# Patient Record
Sex: Female | Born: 1945 | Race: White | Hispanic: No | Smoking: Current every day smoker
Health system: Southern US, Community
[De-identification: ages and names within clinical notes are randomized; demographics above are authoritative.]

## PROBLEM LIST (undated history)

## (undated) DIAGNOSIS — C8590 Non-Hodgkin lymphoma, unspecified, unspecified site: Secondary | ICD-10-CM

## (undated) DIAGNOSIS — I10 Essential (primary) hypertension: Secondary | ICD-10-CM

## (undated) DIAGNOSIS — I739 Peripheral vascular disease, unspecified: Secondary | ICD-10-CM

## (undated) DIAGNOSIS — Z87898 Personal history of other specified conditions: Secondary | ICD-10-CM

## (undated) DIAGNOSIS — Z8619 Personal history of other infectious and parasitic diseases: Secondary | ICD-10-CM

## (undated) DIAGNOSIS — I5020 Unspecified systolic (congestive) heart failure: Secondary | ICD-10-CM

## (undated) DIAGNOSIS — I745 Embolism and thrombosis of iliac artery: Secondary | ICD-10-CM

## (undated) DIAGNOSIS — C859A Non-Hodgkin lymphoma, unspecified, in remission: Secondary | ICD-10-CM

## (undated) DIAGNOSIS — T8859XA Other complications of anesthesia, initial encounter: Secondary | ICD-10-CM

## (undated) DIAGNOSIS — I429 Cardiomyopathy, unspecified: Secondary | ICD-10-CM

## (undated) DIAGNOSIS — C801 Malignant (primary) neoplasm, unspecified: Secondary | ICD-10-CM

## (undated) HISTORY — DX: Malignant (primary) neoplasm, unspecified: C80.1

## (undated) HISTORY — DX: Personal history of other specified conditions: Z87.898

## (undated) HISTORY — PX: GALLBLADDER SURGERY: SHX652

## (undated) HISTORY — DX: Personal history of other infectious and parasitic diseases: Z86.19

---

## 2004-08-11 ENCOUNTER — Ambulatory Visit: Payer: Self-pay | Admitting: Unknown Physician Specialty

## 2004-11-16 ENCOUNTER — Ambulatory Visit: Payer: Self-pay | Admitting: Internal Medicine

## 2005-08-24 ENCOUNTER — Ambulatory Visit: Payer: Self-pay | Admitting: Unknown Physician Specialty

## 2005-11-18 ENCOUNTER — Emergency Department: Payer: Self-pay | Admitting: Emergency Medicine

## 2006-04-30 ENCOUNTER — Ambulatory Visit: Payer: Self-pay | Admitting: Urology

## 2006-10-30 ENCOUNTER — Ambulatory Visit: Payer: Self-pay | Admitting: Urology

## 2007-02-27 ENCOUNTER — Ambulatory Visit: Payer: Self-pay | Admitting: Vascular Surgery

## 2007-03-06 ENCOUNTER — Inpatient Hospital Stay: Payer: Self-pay | Admitting: Vascular Surgery

## 2007-03-16 ENCOUNTER — Emergency Department: Payer: Self-pay | Admitting: Emergency Medicine

## 2007-05-14 ENCOUNTER — Ambulatory Visit: Payer: Self-pay | Admitting: Internal Medicine

## 2007-07-23 ENCOUNTER — Ambulatory Visit: Payer: Self-pay | Admitting: Urology

## 2007-10-16 ENCOUNTER — Ambulatory Visit: Payer: Self-pay | Admitting: Internal Medicine

## 2007-10-29 ENCOUNTER — Ambulatory Visit: Payer: Self-pay | Admitting: Unknown Physician Specialty

## 2007-11-14 ENCOUNTER — Ambulatory Visit: Payer: Self-pay | Admitting: Internal Medicine

## 2007-11-16 ENCOUNTER — Ambulatory Visit: Payer: Self-pay | Admitting: Internal Medicine

## 2007-11-18 ENCOUNTER — Ambulatory Visit: Payer: Self-pay | Admitting: Internal Medicine

## 2007-11-25 ENCOUNTER — Ambulatory Visit: Payer: Self-pay | Admitting: Internal Medicine

## 2007-12-03 ENCOUNTER — Ambulatory Visit: Payer: Self-pay | Admitting: Surgery

## 2007-12-17 ENCOUNTER — Ambulatory Visit: Payer: Self-pay | Admitting: Internal Medicine

## 2007-12-26 ENCOUNTER — Ambulatory Visit: Payer: Self-pay | Admitting: Internal Medicine

## 2008-01-16 ENCOUNTER — Ambulatory Visit: Payer: Self-pay | Admitting: Internal Medicine

## 2008-02-16 ENCOUNTER — Ambulatory Visit: Payer: Self-pay | Admitting: Internal Medicine

## 2008-03-17 ENCOUNTER — Ambulatory Visit: Payer: Self-pay | Admitting: Internal Medicine

## 2008-04-17 ENCOUNTER — Ambulatory Visit: Payer: Self-pay | Admitting: Internal Medicine

## 2008-04-17 HISTORY — PX: TONSILLECTOMY: SUR1361

## 2008-04-20 ENCOUNTER — Ambulatory Visit: Payer: Self-pay | Admitting: Internal Medicine

## 2008-05-18 ENCOUNTER — Ambulatory Visit: Payer: Self-pay | Admitting: Internal Medicine

## 2008-06-15 ENCOUNTER — Ambulatory Visit: Payer: Self-pay | Admitting: Oncology

## 2008-07-16 ENCOUNTER — Ambulatory Visit: Payer: Self-pay | Admitting: Oncology

## 2008-07-21 ENCOUNTER — Ambulatory Visit: Payer: Self-pay | Admitting: Oncology

## 2008-07-28 ENCOUNTER — Ambulatory Visit: Payer: Self-pay | Admitting: Urology

## 2008-08-15 ENCOUNTER — Ambulatory Visit: Payer: Self-pay | Admitting: Oncology

## 2008-09-15 ENCOUNTER — Ambulatory Visit: Payer: Self-pay | Admitting: Oncology

## 2008-10-15 ENCOUNTER — Ambulatory Visit: Payer: Self-pay | Admitting: Oncology

## 2008-10-21 ENCOUNTER — Ambulatory Visit: Payer: Self-pay | Admitting: Unknown Physician Specialty

## 2008-11-15 ENCOUNTER — Ambulatory Visit: Payer: Self-pay | Admitting: Oncology

## 2008-11-20 ENCOUNTER — Ambulatory Visit: Payer: Self-pay | Admitting: Oncology

## 2008-11-24 ENCOUNTER — Ambulatory Visit: Payer: Self-pay | Admitting: Oncology

## 2008-12-16 ENCOUNTER — Ambulatory Visit: Payer: Self-pay | Admitting: Oncology

## 2009-01-15 ENCOUNTER — Ambulatory Visit: Payer: Self-pay | Admitting: Oncology

## 2009-02-15 ENCOUNTER — Ambulatory Visit: Payer: Self-pay | Admitting: Oncology

## 2009-02-16 ENCOUNTER — Ambulatory Visit: Payer: Self-pay | Admitting: Oncology

## 2009-03-17 ENCOUNTER — Ambulatory Visit: Payer: Self-pay | Admitting: Oncology

## 2009-05-04 ENCOUNTER — Ambulatory Visit: Payer: Self-pay | Admitting: Oncology

## 2009-05-18 ENCOUNTER — Ambulatory Visit: Payer: Self-pay | Admitting: Oncology

## 2009-06-15 ENCOUNTER — Ambulatory Visit: Payer: Self-pay | Admitting: Oncology

## 2009-06-29 ENCOUNTER — Ambulatory Visit: Payer: Self-pay | Admitting: Oncology

## 2009-07-16 ENCOUNTER — Ambulatory Visit: Payer: Self-pay | Admitting: Oncology

## 2009-09-07 ENCOUNTER — Ambulatory Visit: Payer: Self-pay | Admitting: Oncology

## 2009-09-15 ENCOUNTER — Ambulatory Visit: Payer: Self-pay | Admitting: Oncology

## 2009-09-16 ENCOUNTER — Ambulatory Visit: Payer: Self-pay | Admitting: Surgery

## 2009-09-23 ENCOUNTER — Ambulatory Visit: Payer: Self-pay | Admitting: Surgery

## 2009-10-15 ENCOUNTER — Ambulatory Visit: Payer: Self-pay | Admitting: Oncology

## 2009-10-26 ENCOUNTER — Ambulatory Visit: Payer: Self-pay | Admitting: Oncology

## 2009-11-15 ENCOUNTER — Ambulatory Visit: Payer: Self-pay | Admitting: Oncology

## 2010-04-06 ENCOUNTER — Ambulatory Visit: Payer: Self-pay | Admitting: Oncology

## 2010-04-17 ENCOUNTER — Ambulatory Visit: Payer: Self-pay | Admitting: Oncology

## 2010-04-19 ENCOUNTER — Ambulatory Visit: Payer: Self-pay | Admitting: Oncology

## 2010-05-18 ENCOUNTER — Ambulatory Visit: Payer: Self-pay | Admitting: Oncology

## 2010-07-04 ENCOUNTER — Ambulatory Visit: Payer: Self-pay | Admitting: Urology

## 2010-11-09 ENCOUNTER — Ambulatory Visit: Payer: Self-pay | Admitting: Oncology

## 2010-11-16 ENCOUNTER — Ambulatory Visit: Payer: Self-pay | Admitting: Oncology

## 2011-04-18 HISTORY — PX: ABDOMINAL HYSTERECTOMY: SHX81

## 2011-05-19 ENCOUNTER — Ambulatory Visit: Payer: Self-pay | Admitting: Oncology

## 2011-05-19 LAB — COMPREHENSIVE METABOLIC PANEL
Albumin: 4.1 g/dL (ref 3.4–5.0)
Alkaline Phosphatase: 214 U/L — ABNORMAL HIGH (ref 50–136)
BUN: 12 mg/dL (ref 7–18)
Bilirubin,Total: 0.3 mg/dL (ref 0.2–1.0)
Chloride: 104 mmol/L (ref 98–107)
Creatinine: 1.13 mg/dL (ref 0.60–1.30)
EGFR (African American): >60
Osmolality: 279 (ref 275–301)
SGOT(AST): 20 U/L (ref 15–37)
SGPT (ALT): 26 U/L
Total Protein: 8 g/dL (ref 6.4–8.2)

## 2011-05-19 LAB — CBC CANCER CENTER
Basophil #: 0.1 x10 3/mm (ref 0.0–0.1)
Eosinophil #: 0.1 x10 3/mm (ref 0.0–0.7)
Eosinophil %: 0.8 %
Lymphocyte #: 1.8 x10 3/mm (ref 1.0–3.6)
Lymphocyte %: 24 %
MCH: 31.1 pg (ref 26.0–34.0)
MCV: 92.8 fL (ref 80–100)
Monocyte #: 0.5 x10 3/mm (ref 0.0–0.7)
Monocyte %: 6.7 %
Neutrophil %: 67.7 %
Platelet: 191 x10 3/mm (ref 150–440)
RBC: 4.36 10*6/uL (ref 3.80–5.20)
RDW: 13.1 % (ref 11.5–14.5)

## 2011-05-19 LAB — SEDIMENTATION RATE: Erythrocyte Sed Rate: 33 mm/hr — ABNORMAL HIGH (ref 0–30)

## 2011-06-16 ENCOUNTER — Ambulatory Visit: Payer: Self-pay | Admitting: Oncology

## 2011-11-17 ENCOUNTER — Ambulatory Visit: Payer: Self-pay | Admitting: Oncology

## 2011-11-17 LAB — CBC CANCER CENTER
Basophil #: 0.1 x10 3/mm (ref 0.0–0.1)
HGB: 13.4 g/dL (ref 12.0–16.0)
Lymphocyte #: 2.2 x10 3/mm (ref 1.0–3.6)
Lymphocyte %: 28.4 %
MCH: 30.9 pg (ref 26.0–34.0)
MCV: 92 fL (ref 80–100)
Monocyte #: 0.7 x10 3/mm (ref 0.2–0.9)
Platelet: 187 x10 3/mm (ref 150–440)
RDW: 14.4 % (ref 11.5–14.5)

## 2011-11-17 LAB — COMPREHENSIVE METABOLIC PANEL
Albumin: 3.8 g/dL (ref 3.4–5.0)
Anion Gap: 10 (ref 7–16)
Calcium, Total: 9.4 mg/dL (ref 8.5–10.1)
Creatinine: 1.05 mg/dL (ref 0.60–1.30)
EGFR (Non-African Amer.): 56 — ABNORMAL LOW
Glucose: 94 mg/dL (ref 65–99)
Osmolality: 273 (ref 275–301)
SGOT(AST): 24 U/L (ref 15–37)
SGPT (ALT): 34 U/L
Sodium: 136 mmol/L (ref 136–145)
Total Protein: 7.6 g/dL (ref 6.4–8.2)

## 2011-11-17 LAB — LACTATE DEHYDROGENASE: LDH: 186 U/L (ref 81–234)

## 2011-11-27 ENCOUNTER — Ambulatory Visit: Payer: Self-pay | Admitting: Oncology

## 2011-12-17 ENCOUNTER — Ambulatory Visit: Payer: Self-pay | Admitting: Oncology

## 2012-12-05 ENCOUNTER — Ambulatory Visit: Payer: Self-pay | Admitting: Oncology

## 2012-12-06 LAB — COMPREHENSIVE METABOLIC PANEL
Albumin: 3.9 g/dL (ref 3.4–5.0)
Anion Gap: 8 (ref 7–16)
BUN: 20 mg/dL — ABNORMAL HIGH (ref 7–18)
Chloride: 103 mmol/L (ref 98–107)
Co2: 28 mmol/L (ref 21–32)
EGFR (African American): 48 — ABNORMAL LOW
EGFR (Non-African Amer.): 42 — ABNORMAL LOW
Glucose: 93 mg/dL (ref 65–99)
Osmolality: 280 (ref 275–301)
SGPT (ALT): 26 U/L (ref 12–78)

## 2012-12-06 LAB — CBC CANCER CENTER
Eosinophil #: 0.1 x10 3/mm (ref 0.0–0.7)
Eosinophil %: 1.3 %
HGB: 12.9 g/dL (ref 12.0–16.0)
Lymphocyte #: 2.4 x10 3/mm (ref 1.0–3.6)
Lymphocyte %: 32.8 %
MCHC: 33.5 g/dL (ref 32.0–36.0)
Monocyte %: 8.4 %
Neutrophil #: 4.1 x10 3/mm (ref 1.4–6.5)
Platelet: 204 x10 3/mm (ref 150–440)
RBC: 4.27 10*6/uL (ref 3.80–5.20)
RDW: 14.3 % (ref 11.5–14.5)

## 2012-12-06 LAB — SEDIMENTATION RATE: Erythrocyte Sed Rate: 42 mm/hr — ABNORMAL HIGH (ref 0–30)

## 2012-12-16 ENCOUNTER — Ambulatory Visit: Payer: Self-pay | Admitting: Oncology

## 2013-02-18 ENCOUNTER — Emergency Department: Payer: Self-pay | Admitting: Emergency Medicine

## 2013-05-27 ENCOUNTER — Ambulatory Visit: Payer: Self-pay | Admitting: Internal Medicine

## 2013-12-19 ENCOUNTER — Ambulatory Visit: Payer: Self-pay | Admitting: Oncology

## 2013-12-19 LAB — COMPREHENSIVE METABOLIC PANEL
ALK PHOS: 166 U/L — AB
Albumin: 3.6 g/dL (ref 3.4–5.0)
Anion Gap: 8 (ref 7–16)
BUN: 15 mg/dL (ref 7–18)
Bilirubin,Total: 0.3 mg/dL (ref 0.2–1.0)
CHLORIDE: 103 mmol/L (ref 98–107)
Calcium, Total: 9.2 mg/dL (ref 8.5–10.1)
Co2: 28 mmol/L (ref 21–32)
Creatinine: 1.15 mg/dL (ref 0.60–1.30)
EGFR (African American): 57 — ABNORMAL LOW
EGFR (Non-African Amer.): 49 — ABNORMAL LOW
GLUCOSE: 88 mg/dL (ref 65–99)
Osmolality: 278 (ref 275–301)
POTASSIUM: 3.9 mmol/L (ref 3.5–5.1)
SGOT(AST): 18 U/L (ref 15–37)
SGPT (ALT): 27 U/L
Sodium: 139 mmol/L (ref 136–145)
Total Protein: 7.5 g/dL (ref 6.4–8.2)

## 2013-12-19 LAB — CBC CANCER CENTER
BASOS PCT: 0.8 %
Basophil #: 0.1 x10 3/mm (ref 0.0–0.1)
EOS ABS: 0.1 x10 3/mm (ref 0.0–0.7)
Eosinophil %: 1.5 %
HCT: 37.4 % (ref 35.0–47.0)
HGB: 12.6 g/dL (ref 12.0–16.0)
Lymphocyte #: 2.3 x10 3/mm (ref 1.0–3.6)
Lymphocyte %: 27.2 %
MCH: 30.9 pg (ref 26.0–34.0)
MCHC: 33.7 g/dL (ref 32.0–36.0)
MCV: 92 fL (ref 80–100)
MONO ABS: 0.6 x10 3/mm (ref 0.2–0.9)
Monocyte %: 7.8 %
NEUTROS ABS: 5.2 x10 3/mm (ref 1.4–6.5)
NEUTROS PCT: 62.7 %
PLATELETS: 213 x10 3/mm (ref 150–440)
RBC: 4.08 10*6/uL (ref 3.80–5.20)
RDW: 14.3 % (ref 11.5–14.5)
WBC: 8.3 x10 3/mm (ref 3.6–11.0)

## 2013-12-19 LAB — LACTATE DEHYDROGENASE: LDH: 187 U/L (ref 81–246)

## 2014-01-15 ENCOUNTER — Ambulatory Visit: Payer: Self-pay | Admitting: Oncology

## 2014-08-17 DIAGNOSIS — Z8579 Personal history of other malignant neoplasms of lymphoid, hematopoietic and related tissues: Secondary | ICD-10-CM | POA: Insufficient documentation

## 2014-11-18 ENCOUNTER — Ambulatory Visit (INDEPENDENT_AMBULATORY_CARE_PROVIDER_SITE_OTHER): Payer: Medicare PPO | Admitting: Family Medicine

## 2014-11-18 ENCOUNTER — Encounter: Payer: Self-pay | Admitting: Family Medicine

## 2014-11-18 VITALS — BP 190/90 | HR 77 | Resp 16 | Ht 62.0 in | Wt 169.0 lb

## 2014-11-18 DIAGNOSIS — R12 Heartburn: Secondary | ICD-10-CM | POA: Diagnosis not present

## 2014-11-18 DIAGNOSIS — F17200 Nicotine dependence, unspecified, uncomplicated: Secondary | ICD-10-CM | POA: Insufficient documentation

## 2014-11-18 DIAGNOSIS — R350 Frequency of micturition: Secondary | ICD-10-CM | POA: Diagnosis not present

## 2014-11-18 DIAGNOSIS — I10 Essential (primary) hypertension: Secondary | ICD-10-CM | POA: Insufficient documentation

## 2014-11-18 DIAGNOSIS — J302 Other seasonal allergic rhinitis: Secondary | ICD-10-CM | POA: Diagnosis not present

## 2014-11-18 DIAGNOSIS — G5792 Unspecified mononeuropathy of left lower limb: Secondary | ICD-10-CM

## 2014-11-18 DIAGNOSIS — M792 Neuralgia and neuritis, unspecified: Secondary | ICD-10-CM

## 2014-11-18 DIAGNOSIS — Z720 Tobacco use: Secondary | ICD-10-CM | POA: Diagnosis not present

## 2014-11-18 DIAGNOSIS — K219 Gastro-esophageal reflux disease without esophagitis: Secondary | ICD-10-CM | POA: Diagnosis not present

## 2014-11-18 MED ORDER — LOSARTAN POTASSIUM 50 MG PO TABS: 50.0000 mg | ORAL_TABLET | Freq: Every day | ORAL | Status: DC

## 2014-11-18 MED ORDER — GABAPENTIN 100 MG PO CAPS: ORAL_CAPSULE | ORAL | Status: DC

## 2014-11-18 MED ORDER — OMEPRAZOLE 20 MG PO CPDR: 20.0000 mg | DELAYED_RELEASE_CAPSULE | Freq: Every day | ORAL | Status: DC

## 2014-11-18 MED ORDER — FLUTICASONE PROPIONATE 50 MCG/ACT NA SUSP: 2.0000 | Freq: Every day | NASAL | Status: DC

## 2014-11-18 NOTE — Progress Notes (Signed)
Name: Ashley Lynch   MRN: [id number]    DOB: 09/07/1945   Date:11/18/2014       Progress Note  Subjective  Chief Complaint  Chief Complaint  Patient presents with  . Establish Care    Follows: Dr. Paulino Door[facility] -Dr. Lucky Cowboy, Dr. Irven Easterly. [facility],Dr.Mcqueen at [facility] and Dr. Humphrey Rolls     HPI  Here to establish care.  Has hx. Of non-Hodgkin's lymphoma, HBP, PVD with cartotid endarectomy, GERD, Neuropathic pain in legs.  Reports 1 episods of swelling around lips 1 day 2 weeks ago.  Rewsolved spontaneous;ly.  Here to change primary proividers.   Past Medical History  Diagnosis Date  . History of measles, mumps, or rubella   . History of chicken pox   . Cancer     lymphoma    Past Surgical History  Procedure Laterality Date  . Gallbladder surgery  [place]  . Abdominal hysterectomy  2013  . Tonsillectomy  2010    Family History  Problem Relation Age of Onset  . Diabetes Mother     sibling and grandparent also    History   Social History  . Marital Status: Widowed    Spouse Name: N/A  . Number of Children: N/A  . Years of Education: N/A   Occupational History  . Not on file.   Social History Main Topics  . Smoking status: Current Every Day Smoker -- 1.00 packs/day for 52 years    Types: Cigarettes  . Smokeless tobacco: Never Used  . Alcohol Use: No  . Drug Use: No  . Sexual Activity: Not on file   Other Topics Concern  . Not on file   Social History Narrative  . No narrative on file     Current outpatient prescriptions:  .  amLODipine (NORVASC) 5 MG tablet, Take 5 mg by mouth daily., Disp: , Rfl: 4 .  aspirin 81 MG tablet, Take 81 mg by mouth daily., Disp: , Rfl:  .  fluticasone (FLONASE) 50 MCG/ACT nasal spray, daily., Disp: , Rfl: 0 .  gabapentin (NEURONTIN) 100 MG capsule, TAKE 2 CAPSULES BY MOUTH AT BEDTIME AS DIRECTED, Disp: , Rfl: 6 .  omeprazole (PRILOSEC) 20 MG capsule, Take 20 mg by mouth daily., Disp: , Rfl: 6  No Known  Allergies   Review of Systems  Constitutional: Positive for malaise/fatigue. Negative for fever, chills and weight loss.  HENT: Positive for tinnitus. Negative for congestion, ear pain and nosebleeds.   Eyes: Negative for blurred vision and double vision.  Respiratory: Positive for wheezing. Negative for cough, sputum production and shortness of breath.   Cardiovascular: Positive for palpitations (occ.). Negative for chest pain, claudication and leg swelling.  Gastrointestinal: Positive for heartburn and vomiting. Negative for nausea, abdominal pain, diarrhea and blood in stool.  Genitourinary: Positive for urgency and frequency. Negative for dysuria and hematuria.  Musculoskeletal: Positive for myalgias and back pain.  Skin: Positive for itching. Negative for rash.  Neurological: Positive for tingling (L. leg) and weakness. Negative for dizziness and headaches.       Leg cramps especially at night.  Psychiatric/Behavioral: Negative for depression. The patient is not nervous/anxious and does not have insomnia.       Objective  Filed Vitals:   11/18/14 1016  BP: 170/84  Pulse: 77  Resp: 16  Height: 5\' 2"  (1.575 m)  Weight: 169 lb (76.658 kg)    Physical Exam  Constitutional: She is oriented to person, place, and time  and well-developed, well-nourished, and in no distress. No distress.  HENT:  Head: Normocephalic and atraumatic.  Eyes: Conjunctivae and EOM are normal. Pupils are equal, round, and reactive to light. No scleral icterus.  Neck: Normal range of motion. Neck supple. No thyromegaly present.  R. zarotid endartectomy scar.  Cardiovascular: Normal rate, regular rhythm, normal heart sounds and intact distal pulses.   Occasional extrasystoles are present. Exam reveals no gallop and no friction rub.   No murmur heard. Pulmonary/Chest: Effort normal and breath sounds normal. No respiratory distress. She has no wheezes. She has no rales. She exhibits no tenderness.   Abdominal: Soft. Bowel sounds are normal. She exhibits no distension and no mass. There is tenderness (mild diffuse tenderness of bilateral lower quads.). There is no rebound and no guarding.  Musculoskeletal: Normal range of motion. She exhibits edema (trace bilateral pedal edema.).  Lymphadenopathy:    She has no cervical adenopathy.  Neurological: She is alert and oriented to person, place, and time.  Skin: Skin is warm and dry.  Vitals reviewed.         Assessment & Plan  Problem List Items Addressed This Visit      Cardiovascular and Mediastinum   Hypertension   Relevant Medications   amLODipine (NORVASC) 5 MG tablet   aspirin 81 MG tablet     Other   Heartburn - Primary   Urinary frequency    Other Visit Diagnoses    Smoking           Meds ordered this encounter  Medications  . amLODipine (NORVASC) 5 MG tablet    Sig: Take 5 mg by mouth daily.    Refill:  4  . DISCONTD: cyclobenzaprine (FLEXERIL) 10 MG tablet    Sig: Take 10 mg by mouth at bedtime as needed.    Refill:  4  . fluticasone (FLONASE) 50 MCG/ACT nasal spray    Sig: daily.    Refill:  0  . gabapentin (NEURONTIN) 100 MG capsule    Sig: TAKE 2 CAPSULES BY MOUTH AT BEDTIME AS DIRECTED    Refill:  6  . omeprazole (PRILOSEC) 20 MG capsule    Sig: Take 20 mg by mouth daily.    Refill:  6  . DISCONTD: sertraline (ZOLOFT) 50 MG tablet    Sig: ONCE A DAY BY MOUTH    Refill:  3  . aspirin 81 MG tablet    Sig: Take 81 mg by mouth daily.    1. Heartburn   2. Essential hypertension  - losartan (COZAAR) 50 MG tablet; Take 1 tablet (50 mg total) by mouth daily.  Dispense: 90 tablet; Refill: 3  3. Urinary frequency   4. Smoking   5. Neuropathic pain, leg, left  - gabapentin (NEURONTIN) 100 MG capsule; Take 2 caps each night  Dispense: 60 capsule; Refill: 6  6. Gastroesophageal reflux disease without esophagitis  - omeprazole (PRILOSEC) 20 MG capsule; Take 1 capsule (20 mg total) by mouth  daily.  Dispense: 90 capsule; Refill: 3  7. Seasonal allergies  - fluticasone (FLONASE) 50 MCG/ACT nasal spray; Place 2 sprays into both nostrils daily. Dispense 3 bottles.  Dispense: 16 g; Refill: 3

## 2014-11-18 NOTE — Addendum Note (Signed)
Addended by: Theresia Majors A on: 11/18/2014 11:26 AM   Modules accepted: Orders

## 2014-11-24 LAB — CBC
HEMATOCRIT: 36.6 % (ref 34.0–46.6)
HEMOGLOBIN: 12.4 g/dL (ref 11.1–15.9)
MCH: 31 pg (ref 26.6–33.0)
MCHC: 33.9 g/dL (ref 31.5–35.7)
MCV: 92 fL (ref 79–97)
Platelets: 257 10*3/uL (ref 150–379)
RBC: 4 x10E6/uL (ref 3.77–5.28)
RDW: 14.1 % (ref 12.3–15.4)
WBC: 8.3 10*3/uL (ref 3.4–10.8)

## 2014-11-25 LAB — COMPREHENSIVE METABOLIC PANEL
ALT: 15 IU/L (ref 0–32)
AST: 16 IU/L (ref 0–40)
Albumin/Globulin Ratio: 1.6 (ref 1.1–2.5)
Albumin: 4.2 g/dL (ref 3.6–4.8)
Alkaline Phosphatase: 150 IU/L — ABNORMAL HIGH (ref 39–117)
BUN/Creatinine Ratio: 17 (ref 11–26)
BUN: 18 mg/dL (ref 8–27)
Bilirubin Total: 0.2 mg/dL (ref 0.0–1.2)
CALCIUM: 9.7 mg/dL (ref 8.7–10.3)
CO2: 22 mmol/L (ref 18–29)
CREATININE: 1.03 mg/dL — AB (ref 0.57–1.00)
Chloride: 101 mmol/L (ref 97–108)
GFR calc Af Amer: 65 mL/min/{1.73_m2} (ref >59)
GFR, EST NON AFRICAN AMERICAN: 56 mL/min/{1.73_m2} — AB (ref >59)
GLOBULIN, TOTAL: 2.6 g/dL (ref 1.5–4.5)
Glucose: 91 mg/dL (ref 65–99)
Potassium: 4.5 mmol/L (ref 3.5–5.2)
SODIUM: 142 mmol/L (ref 134–144)
Total Protein: 6.8 g/dL (ref 6.0–8.5)

## 2014-11-25 NOTE — Progress Notes (Signed)
mailed

## 2014-12-11 ENCOUNTER — Other Ambulatory Visit: Payer: Self-pay | Admitting: *Deleted

## 2014-12-11 DIAGNOSIS — Z8579 Personal history of other malignant neoplasms of lymphoid, hematopoietic and related tissues: Secondary | ICD-10-CM

## 2014-12-17 ENCOUNTER — Inpatient Hospital Stay: Payer: Medicare PPO

## 2014-12-17 ENCOUNTER — Inpatient Hospital Stay: Payer: Medicare PPO | Admitting: Oncology

## 2014-12-23 ENCOUNTER — Encounter: Payer: Self-pay | Admitting: Family Medicine

## 2014-12-23 ENCOUNTER — Ambulatory Visit (INDEPENDENT_AMBULATORY_CARE_PROVIDER_SITE_OTHER): Payer: Medicare PPO | Admitting: Family Medicine

## 2014-12-23 ENCOUNTER — Inpatient Hospital Stay (HOSPITAL_BASED_OUTPATIENT_CLINIC_OR_DEPARTMENT_OTHER): Payer: Medicare PPO | Admitting: Oncology

## 2014-12-23 ENCOUNTER — Inpatient Hospital Stay: Payer: Medicare PPO | Attending: Oncology

## 2014-12-23 ENCOUNTER — Encounter: Payer: Self-pay | Admitting: Oncology

## 2014-12-23 VITALS — BP 130/79 | HR 80 | Temp 98.9°F | Resp 16 | Ht 62.0 in | Wt 168.0 lb

## 2014-12-23 VITALS — BP 133/83 | HR 74 | Temp 95.6°F | Wt 169.2 lb

## 2014-12-23 DIAGNOSIS — R531 Weakness: Secondary | ICD-10-CM | POA: Diagnosis not present

## 2014-12-23 DIAGNOSIS — Z23 Encounter for immunization: Secondary | ICD-10-CM

## 2014-12-23 DIAGNOSIS — Z8579 Personal history of other malignant neoplasms of lymphoid, hematopoietic and related tissues: Secondary | ICD-10-CM

## 2014-12-23 DIAGNOSIS — F17210 Nicotine dependence, cigarettes, uncomplicated: Secondary | ICD-10-CM | POA: Insufficient documentation

## 2014-12-23 DIAGNOSIS — R509 Fever, unspecified: Secondary | ICD-10-CM | POA: Diagnosis not present

## 2014-12-23 DIAGNOSIS — I10 Essential (primary) hypertension: Secondary | ICD-10-CM

## 2014-12-23 DIAGNOSIS — Z7982 Long term (current) use of aspirin: Secondary | ICD-10-CM | POA: Insufficient documentation

## 2014-12-23 DIAGNOSIS — M549 Dorsalgia, unspecified: Secondary | ICD-10-CM | POA: Insufficient documentation

## 2014-12-23 DIAGNOSIS — Z8572 Personal history of non-Hodgkin lymphomas: Secondary | ICD-10-CM

## 2014-12-23 DIAGNOSIS — R5383 Other fatigue: Secondary | ICD-10-CM | POA: Insufficient documentation

## 2014-12-23 DIAGNOSIS — M898X9 Other specified disorders of bone, unspecified site: Secondary | ICD-10-CM | POA: Insufficient documentation

## 2014-12-23 DIAGNOSIS — Z79899 Other long term (current) drug therapy: Secondary | ICD-10-CM | POA: Diagnosis not present

## 2014-12-23 LAB — CBC WITH DIFFERENTIAL/PLATELET
Basophils Absolute: 0.1 10*3/uL (ref 0–0.1)
Basophils Relative: 1 %
EOS ABS: 0.2 10*3/uL (ref 0–0.7)
Eosinophils Relative: 2 %
HCT: 36.4 % (ref 35.0–47.0)
HEMOGLOBIN: 12.3 g/dL (ref 12.0–16.0)
LYMPHS ABS: 2.7 10*3/uL (ref 1.0–3.6)
LYMPHS PCT: 33 %
MCH: 30.4 pg (ref 26.0–34.0)
MCHC: 33.8 g/dL (ref 32.0–36.0)
MCV: 90.1 fL (ref 80.0–100.0)
MONOS PCT: 8 %
Monocytes Absolute: 0.7 10*3/uL (ref 0.2–0.9)
NEUTROS ABS: 4.5 10*3/uL (ref 1.4–6.5)
NEUTROS PCT: 56 %
Platelets: 205 10*3/uL (ref 150–440)
RBC: 4.04 MIL/uL (ref 3.80–5.20)
RDW: 13.9 % (ref 11.5–14.5)
WBC: 8.1 10*3/uL (ref 3.6–11.0)

## 2014-12-23 LAB — COMPREHENSIVE METABOLIC PANEL
ALT: 16 U/L (ref 14–54)
AST: 19 U/L (ref 15–41)
Albumin: 3.9 g/dL (ref 3.5–5.0)
Alkaline Phosphatase: 124 U/L (ref 38–126)
Anion gap: 6 (ref 5–15)
BILIRUBIN TOTAL: 0.4 mg/dL (ref 0.3–1.2)
BUN: 20 mg/dL (ref 6–20)
CO2: 24 mmol/L (ref 22–32)
CREATININE: 1.16 mg/dL — AB (ref 0.44–1.00)
Calcium: 8.4 mg/dL — ABNORMAL LOW (ref 8.9–10.3)
Chloride: 107 mmol/L (ref 101–111)
GFR calc non Af Amer: 47 mL/min — ABNORMAL LOW (ref >60)
GFR, EST AFRICAN AMERICAN: 54 mL/min — AB (ref >60)
Glucose, Bld: 122 mg/dL — ABNORMAL HIGH (ref 65–99)
Potassium: 3.6 mmol/L (ref 3.5–5.1)
SODIUM: 137 mmol/L (ref 135–145)
TOTAL PROTEIN: 7.1 g/dL (ref 6.5–8.1)

## 2014-12-23 LAB — LACTATE DEHYDROGENASE: LDH: 143 U/L (ref 98–192)

## 2014-12-23 MED ORDER — LOSARTAN POTASSIUM 25 MG PO TABS: ORAL_TABLET | ORAL | Status: DC

## 2014-12-23 NOTE — Progress Notes (Signed)
Name: Ashley Lynch   MRN: [id number]    DOB: 08/26/1945   Date:12/23/2014       Progress Note  Subjective  Chief Complaint  Chief Complaint  Patient presents with  . Hypertension    Pt d/c Losartan    HPI  Here to f/u HBP.  Had back and abd pain after taking Losartan for 2 weeks.  Stopped it and says feels better.  But Dr. Jeb Levering feels that Liver is enlagred and she is going to have a PET scan.  BPs At home running 130s-150s/75-90 .  Was better on Losartan. No problem-specific assessment & plan notes found for this encounter.   Past Medical History  Diagnosis Date  . History of measles, mumps, or rubella   . History of chicken pox   . Cancer     lymphoma    Social History  Substance Use Topics  . Smoking status: Current Every Day Smoker -- 1.00 packs/day for 52 years    Types: Cigarettes  . Smokeless tobacco: Never Used  . Alcohol Use: No     Current outpatient prescriptions:  .  amLODipine (NORVASC) 5 MG tablet, Take 5 mg by mouth daily., Disp: , Rfl: 4 .  aspirin 81 MG tablet, Take 81 mg by mouth daily., Disp: , Rfl:  .  diphenhydrAMINE (BENADRYL) 25 mg capsule, Take 25 mg by mouth every 6 (six) hours as needed., Disp: , Rfl:  .  fluticasone (FLONASE) 50 MCG/ACT nasal spray, Place 2 sprays into both nostrils daily. Dispense 3 bottles., Disp: 16 g, Rfl: 3 .  gabapentin (NEURONTIN) 100 MG capsule, Take 2 caps each night, Disp: 60 capsule, Rfl: 6 .  meclizine (ANTIVERT) 25 MG tablet, Take 25 mg by mouth daily as needed for dizziness., Disp: , Rfl:  .  omeprazole (PRILOSEC) 20 MG capsule, Take 1 capsule (20 mg total) by mouth daily., Disp: 90 capsule, Rfl: 3  No Known Allergies  Review of Systems  Constitutional: Positive for malaise/fatigue. Negative for fever, chills and weight loss.  HENT: Negative for hearing loss.   Eyes: Negative for blurred vision and double vision.  Respiratory: Negative for cough, sputum production, shortness of breath and wheezing.    Cardiovascular: Negative for chest pain, palpitations, orthopnea and leg swelling.  Gastrointestinal: Negative for heartburn, nausea, vomiting, abdominal pain, diarrhea and blood in stool.  Genitourinary: Negative for dysuria, urgency and frequency.  Musculoskeletal: Negative for myalgias and back pain.  Skin: Negative for rash.  Neurological: Negative for dizziness, sensory change, focal weakness, weakness and headaches.      Objective  Filed Vitals:   12/23/14 1339  BP: 130/79  Pulse: 80  Temp: 98.9 F (37.2 C)  TempSrc: Oral  Resp: 16  Height: '5\' 2"'  (1.575 m)  Weight: 168 lb (76.204 kg)     Physical Exam  Constitutional: She is well-developed, well-nourished, and in no distress. No distress.  HENT:  Head: Normocephalic and atraumatic.  Eyes: Conjunctivae and EOM are normal. Pupils are equal, round, and reactive to light. No scleral icterus.  Neck: Normal range of motion. Neck supple. Carotid bruit is not present. No thyromegaly present.  Cardiovascular: Normal rate, regular rhythm, normal heart sounds and intact distal pulses.  Exam reveals no gallop and no friction rub.   No murmur heard. Pulmonary/Chest: Effort normal and breath sounds normal. No respiratory distress. She has no wheezes. She exhibits no tenderness.  Abdominal: Soft. Bowel sounds are normal. She exhibits no distension, no abdominal bruit and no  mass. There is no tenderness.  Musculoskeletal: Normal range of motion. She exhibits no edema.  Lymphadenopathy:    She has no cervical adenopathy.  Vitals reviewed.     Recent Results (from the past 2160 hour(s))  CBC     Status: None   Collection Time: 11/24/14  9:45 AM  Result Value Ref Range   WBC 8.3 3.4 - 10.8 x10E3/uL   RBC 4.00 3.77 - 5.28 x10E6/uL   Hemoglobin 12.4 11.1 - 15.9 g/dL   Hematocrit 36.6 34.0 - 46.6 %   MCV 92 79 - 97 fL   MCH 31.0 26.6 - 33.0 pg   MCHC 33.9 31.5 - 35.7 g/dL   RDW 14.1 12.3 - 15.4 %   Platelets 257 150 - 379  x10E3/uL  Comprehensive Metabolic Panel (CMET)     Status: Abnormal   Collection Time: 11/24/14  9:45 AM  Result Value Ref Range   Glucose 91 65 - 99 mg/dL   BUN 18 8 - 27 mg/dL   Creatinine, Ser 1.03 (H) 0.57 - 1.00 mg/dL   GFR calc non Af Amer 56 (L) >59 mL/min/1.73   GFR calc Af Amer 65 >59 mL/min/1.73   BUN/Creatinine Ratio 17 11 - 26   Sodium 142 134 - 144 mmol/L   Potassium 4.5 3.5 - 5.2 mmol/L   Chloride 101 97 - 108 mmol/L   CO2 22 18 - 29 mmol/L   Calcium 9.7 8.7 - 10.3 mg/dL   Total Protein 6.8 6.0 - 8.5 g/dL   Albumin 4.2 3.6 - 4.8 g/dL   Globulin, Total 2.6 1.5 - 4.5 g/dL   Albumin/Globulin Ratio 1.6 1.1 - 2.5   Bilirubin Total 0.2 0.0 - 1.2 mg/dL   Alkaline Phosphatase 150 (H) 39 - 117 IU/L   AST 16 0 - 40 IU/L   ALT 15 0 - 32 IU/L  CBC with Differential     Status: None   Collection Time: 12/23/14 11:01 AM  Result Value Ref Range   WBC 8.1 3.6 - 11.0 K/uL   RBC 4.04 3.80 - 5.20 MIL/uL   Hemoglobin 12.3 12.0 - 16.0 g/dL   HCT 36.4 35.0 - 47.0 %   MCV 90.1 80.0 - 100.0 fL   MCH 30.4 26.0 - 34.0 pg   MCHC 33.8 32.0 - 36.0 g/dL   RDW 13.9 11.5 - 14.5 %   Platelets 205 150 - 440 K/uL   Neutrophils Relative % 56 %   Neutro Abs 4.5 1.4 - 6.5 K/uL   Lymphocytes Relative 33 %   Lymphs Abs 2.7 1.0 - 3.6 K/uL   Monocytes Relative 8 %   Monocytes Absolute 0.7 0.2 - 0.9 K/uL   Eosinophils Relative 2 %   Eosinophils Absolute 0.2 0 - 0.7 K/uL   Basophils Relative 1 %   Basophils Absolute 0.1 0 - 0.1 K/uL  Comprehensive metabolic panel     Status: Abnormal   Collection Time: 12/23/14 11:01 AM  Result Value Ref Range   Sodium 137 135 - 145 mmol/L   Potassium 3.6 3.5 - 5.1 mmol/L   Chloride 107 101 - 111 mmol/L   CO2 24 22 - 32 mmol/L   Glucose, Bld 122 (H) 65 - 99 mg/dL   BUN 20 6 - 20 mg/dL   Creatinine, Ser 1.16 (H) 0.44 - 1.00 mg/dL   Calcium 8.4 (L) 8.9 - 10.3 mg/dL   Total Protein 7.1 6.5 - 8.1 g/dL   Albumin 3.9 3.5 - 5.0 g/dL  AST 19 15 - 41 U/L   ALT  16 14 - 54 U/L   Alkaline Phosphatase 124 38 - 126 U/L   Total Bilirubin 0.4 0.3 - 1.2 mg/dL   GFR calc non Af Amer 47 (L) >60 mL/min   GFR calc Af Amer 54 (L) >60 mL/min    Comment: (NOTE) The eGFR has been calculated using the CKD EPI equation. This calculation has not been validated in all clinical situations. eGFR's persistently <60 mL/min signify possible Chronic Kidney Disease.    Anion gap 6 5 - 15  Lactate dehydrogenase     Status: None   Collection Time: 12/23/14 11:01 AM  Result Value Ref Range   LDH 143 98 - 192 U/L     Assessment & Plan  1. Essential hypertension  - losartan (COZAAR) 25 MG tablet; Take 1/2 tablet daily  Dispense: 30 tablet; Refill: 6  2. Need for influenza vaccination  - Flu vaccine HIGH DOSE PF (Fluzone High dose)

## 2014-12-23 NOTE — Progress Notes (Signed)
Patient does not have living will.  Current every day smoker.  Patient has new PCP - Dr. Larene Beach.

## 2014-12-23 NOTE — Progress Notes (Signed)
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 (immunohistochemistry showed positive for CD20, BCL 6, weak positive for CD10. Negative for BCL-2, CD5 and CD3). bone marrow study negative for lymphom  No flowsheet data found.  INTERVAL HISTORY:   69 year old lady with a history of diffuse B large cell lymphoma in today for the follow-up overall continues to feel somewhat weak [place] has low-grade fever.  No nausea.  No vomiting.  Appetite has been fairly good.  No chills. Back pain and some diffuse bony pain  REVIEW OF SYSTEMS:    general status: Patient is feeling weak and tired.  No change in a performance status.  No chills.  No fever. HEENT:   No evidence of stomatitis Lungs: No cough or shortness of breath Cardiac: No chest pain or paroxysmal nocturnal dyspnea GI: No nausea no vomiting no diarrhea no abdominal pain Skin: No rash Lower extremity no swelling Neurological system: No tingling.  No numbness.  No other focal signs Musculoskeletal system no bony pains  As per HPI. Otherwise, a complete review of systems is negatve.  PAST MEDICAL HISTORY: Past Medical History  Diagnosis Date  . History of measles, mumps, or rubella   . History of chicken pox   . Cancer     lymphoma    PAST SURGICAL HISTORY: Past Surgical History  Procedure Laterality Date  . Gallbladder surgery  [place]  . Abdominal hysterectomy  2013  . Tonsillectomy  2010    FAMILY HISTORY Family History  Problem Relation Age of Onset  . Diabetes Mother     sibling and grandparent also    ADVANCED DIRECTIVES:  No flowsheet data found.  HEALTH MAINTENANCE: Social History  Substance Use  Topics  . Smoking status: Current Every Day Smoker -- 1.00 packs/day for 52 years    Types: Cigarettes  . Smokeless tobacco: Never Used  . Alcohol Use: No      No Known Allergies  Current Outpatient Prescriptions  Medication Sig Dispense Refill  . amLODipine (NORVASC) 5 MG tablet Take 5 mg by mouth daily.  4  . aspirin 81 MG tablet Take 81 mg by mouth daily.    . diphenhydrAMINE (BENADRYL) 25 mg capsule Take 25 mg by mouth every 6 (six) hours as needed.    . fluticasone (FLONASE) 50 MCG/ACT nasal spray Place 2 sprays into both nostrils daily. Dispense 3 bottles. 16 g 3  . gabapentin (NEURONTIN) 100 MG capsule Take 2 caps each night 60 capsule 6  . losartan (COZAAR) 50 MG tablet Take 1 tablet (50 mg total) by mouth daily. (Patient not taking: Reported on 12/23/2014) 90 tablet 3  . omeprazole (PRILOSEC) 20 MG capsule Take 1 capsule (20 mg total) by mouth daily. (Patient not taking: Reported on 12/23/2014) 90 capsule 3   No current facility-administered medications for this visit.    OBJECTIVE:  Filed Vitals:   12/23/14 1225  BP: 133/83  Pulse: 74  Temp: 95.6 F (35.3 C)     Body mass index is 30.95 kg/(m^2).    ECOG FS:1 - Symptomatic but completely ambulatory  PHYSICAL EXAM: Gen. status: Patient is alert and oriented .  Not in any acute distress  Head exam was generally normal. There was no scleral icterus or corneal arcus. Mucous membranes were moist. Lymphatic system: Supraclavicular, cervical, axillary, inguinal lymph nodes are not palpable Lungs: Air entry on both sides no palpitation, no rales Abdomen: Soft liver and spleen are palpable node ascites Lower extremity no edema Neurologically, the patient was awake, alert, and oriented to person, place and time. There were no obvious focal neurologic abnormalities. Examination of the skin revealed no evidence of significant rashes, suspicious appearing nevi or other concerning lesions.   LAB RESULTS:  CBC Latest Ref Rng  12/23/2014 11/24/2014  WBC 3.6 - 11.0 K/uL 8.1 8.3  Hemoglobin 12.0 - 16.0 g/dL 12.3 -  Hematocrit 35.0 - 47.0 % 36.4 36.6  Platelets 150 - 440 K/uL 205 -    Appointment on 12/23/2014  Component Date Value Ref Range Status  . WBC 12/23/2014 8.1  3.6 - 11.0 K/uL Final  . RBC 12/23/2014 4.04  3.80 - 5.20 MIL/uL Final  . Hemoglobin 12/23/2014 12.3  12.0 - 16.0 g/dL Final  . HCT 12/23/2014 36.4  35.0 - 47.0 % Final  . MCV 12/23/2014 90.1  80.0 - 100.0 fL Final  . MCH 12/23/2014 30.4  26.0 - 34.0 pg Final  . MCHC 12/23/2014 33.8  32.0 - 36.0 g/dL Final  . RDW 12/23/2014 13.9  11.5 - 14.5 % Final  . Platelets 12/23/2014 205  150 - 440 K/uL Final  . Neutrophils Relative % 12/23/2014 56   Final  . Neutro Abs 12/23/2014 4.5  1.4 - 6.5 K/uL Final  . Lymphocytes Relative 12/23/2014 33   Final  . Lymphs Abs 12/23/2014 2.7  1.0 - 3.6 K/uL Final  . Monocytes Relative 12/23/2014 8   Final  . Monocytes Absolute 12/23/2014 0.7  0.2 - 0.9 K/uL Final  . Eosinophils Relative 12/23/2014 2   Final  . Eosinophils Absolute 12/23/2014 0.2  0 - 0.7 K/uL Final  . Basophils Relative 12/23/2014 1   Final  . Basophils Absolute 12/23/2014 0.1  0 - 0.1 K/uL Final  . Sodium 12/23/2014 137  135 - 145 mmol/L Final  . Potassium 12/23/2014 3.6  3.5 - 5.1 mmol/L Final  . Chloride 12/23/2014 107  101 - 111 mmol/L Final  . CO2 12/23/2014 24  22 - 32 mmol/L Final  . Glucose, Bld 12/23/2014 122* 65 - 99 mg/dL Final  . BUN 12/23/2014 20  6 - 20 mg/dL Final  . Creatinine, Ser 12/23/2014 1.16* 0.44 - 1.00 mg/dL Final  . Calcium 12/23/2014 8.4* 8.9 - 10.3 mg/dL Final  . Total Protein 12/23/2014 7.1  6.5 - 8.1 g/dL Final  . Albumin 12/23/2014 3.9  3.5 - 5.0 g/dL Final  . AST 12/23/2014 19  15 - 41 U/L Final  . ALT 12/23/2014 16  14 - 54 U/L Final  . Alkaline Phosphatase 12/23/2014 124  38 - 126 U/L Final  . Total Bilirubin 12/23/2014 0.4  0.3 - 1.2 mg/dL Final  . GFR calc non Af Amer 12/23/2014 47* >60 mL/min Final  . GFR  calc Af Amer 12/23/2014 54* >60 mL/min Final   Comment: (NOTE) The eGFR has been calculated using the CKD EPI equation. This calculation has not been validated in all clinical situations. eGFR's persistently <60 mL/min signify possible Chronic Kidney Disease.   . Anion gap 12/23/2014 6  5 - 15 Final  . LDH 12/23/2014 143  98 - 192 U/L Final        ASSESSMENT: Diffuse B large cell lymphoma stage  IIA  MEDICAL DECISION MAKING:  On clinical exam there is no evidence of recurrent disease.  Some of the symptoms of night sweats and weakness only pain is bothersome if it continues may need a PET scan for reevaluation of disease process Patient expressed understanding and was in agreement with this plan. She also understands that She can call clinic at any time with any questions, concerns, or complaints.    No matching staging information was found for the patient.  Forest Gleason, MD   12/23/2014 12:46 PM

## 2014-12-23 NOTE — Patient Instructions (Signed)
If she has any abd. Or back pain, she will stop Losartan and call office.

## 2014-12-28 ENCOUNTER — Telehealth: Payer: Self-pay | Admitting: *Deleted

## 2014-12-28 ENCOUNTER — Other Ambulatory Visit: Payer: Self-pay

## 2014-12-28 ENCOUNTER — Ambulatory Visit: Payer: Medicare PPO

## 2014-12-28 ENCOUNTER — Ambulatory Visit: Payer: Self-pay | Admitting: Oncology

## 2014-12-28 DIAGNOSIS — Z8579 Personal history of other malignant neoplasms of lymphoid, hematopoietic and related tissues: Secondary | ICD-10-CM

## 2014-12-28 NOTE — Telephone Encounter (Signed)
Pt upset regarding cancelling pet scan. MD spoke with pt and stated will attempt to get pet scan approved through insurance but explained to pt that there is a change pet may be denied by insurance. If pet is denied, MD stated will order CT scan of chest/abdomen. Pt verbalized understanding.

## 2014-12-28 NOTE — Addendum Note (Signed)
Addended by: Telford Nab on: 12/28/2014 02:17 PM   Modules accepted: Orders

## 2014-12-28 NOTE — Telephone Encounter (Signed)
-----   Message from Forest Gleason, MD sent at 12/25/2014  3:51 PM EDT ----- Regarding: pet scan Note has been done however reviewing the chart may want to cancel PET scan and wait till next appointment.  The patient's symptoms persist PET scan may be needed.  So at please cancel PET scan at present time and I will talk to you on Monday about next step

## 2014-12-28 NOTE — Telephone Encounter (Signed)
Pt called to inform of cancelling PET scan and MD appt this week. Instructed pt to callback if symptoms persist but will go ahead and schedule next follow up in  6 months unless has recurring symptoms. Pt verbalized understanding.

## 2014-12-29 ENCOUNTER — Ambulatory Visit: Payer: Medicare PPO | Admitting: Oncology

## 2014-12-30 ENCOUNTER — Ambulatory Visit: Payer: Medicare PPO

## 2014-12-31 ENCOUNTER — Ambulatory Visit: Payer: Medicare PPO | Admitting: Oncology

## 2014-12-31 ENCOUNTER — Other Ambulatory Visit: Payer: Medicare PPO

## 2015-01-01 ENCOUNTER — Ambulatory Visit (INDEPENDENT_AMBULATORY_CARE_PROVIDER_SITE_OTHER): Payer: Medicare PPO | Admitting: Family Medicine

## 2015-01-01 ENCOUNTER — Encounter: Payer: Self-pay | Admitting: Family Medicine

## 2015-01-01 ENCOUNTER — Ambulatory Visit
Admission: RE | Admit: 2015-01-01 | Discharge: 2015-01-01 | Disposition: A | Discharge status: Home / Self Care | Payer: Medicare PPO | Source: Ambulatory Visit | Attending: Family Medicine | Admitting: Family Medicine

## 2015-01-01 VITALS — BP 138/85 | HR 71 | Temp 97.8°F | Resp 16 | Ht 62.0 in | Wt 168.0 lb

## 2015-01-01 DIAGNOSIS — G5792 Unspecified mononeuropathy of left lower limb: Secondary | ICD-10-CM | POA: Insufficient documentation

## 2015-01-01 DIAGNOSIS — M79605 Pain in left leg: Secondary | ICD-10-CM | POA: Diagnosis present

## 2015-01-01 DIAGNOSIS — Z8579 Personal history of other malignant neoplasms of lymphoid, hematopoietic and related tissues: Secondary | ICD-10-CM

## 2015-01-01 DIAGNOSIS — M792 Neuralgia and neuritis, unspecified: Secondary | ICD-10-CM

## 2015-01-01 DIAGNOSIS — Z2839 Other underimmunization status: Secondary | ICD-10-CM

## 2015-01-01 DIAGNOSIS — Z283 Underimmunization status: Secondary | ICD-10-CM

## 2015-01-01 DIAGNOSIS — Z23 Encounter for immunization: Secondary | ICD-10-CM | POA: Diagnosis not present

## 2015-01-01 DIAGNOSIS — M5136 Other intervertebral disc degeneration, lumbar region: Secondary | ICD-10-CM | POA: Diagnosis not present

## 2015-01-01 DIAGNOSIS — R102 Pelvic and perineal pain: Secondary | ICD-10-CM

## 2015-01-01 MED ORDER — GABAPENTIN 100 MG PO CAPS: 100.0000 mg | ORAL_CAPSULE | Freq: Three times a day (TID) | ORAL | Status: DC

## 2015-01-01 MED ORDER — MELOXICAM 15 MG PO TABS: 15.0000 mg | ORAL_TABLET | Freq: Every day | ORAL | Status: DC

## 2015-01-01 NOTE — Patient Instructions (Signed)
Get PET scan next week and see Dr. Jeb Levering as he recommends.

## 2015-01-01 NOTE — Addendum Note (Signed)
Addended by: Theresia Majors A on: 01/01/2015 12:03 PM   Modules accepted: Orders

## 2015-01-01 NOTE — Progress Notes (Signed)
Name: Ashley Lynch   MRN: [id number]    DOB: Aug 30, 1945   Date:01/01/2015       Progress Note  Subjective  Chief Complaint  Chief Complaint  Patient presents with  . Leg Pain    R leg pain onset 2 days ago worst at night     HPI  L. Leg pain from L/ hip/buttock down L. leg down to L. Ankle.  Taking miniscule dose of Gabapentin.   Says she thinks it is bone pain..  Achy pain  Not sharp orf stinging or stabbing.  B tgter in AM on awakening.  Gets worse during the day and much worse at night going to b ed. No problem-specific assessment & plan notes found for this encounter.   Past Medical History  Diagnosis Date  . History of measles, mumps, or rubella   . History of chicken pox   . Cancer     lymphoma    Social History  Substance Use Topics  . Smoking status: Current Every Day Smoker -- 1.00 packs/day for 52 years    Types: Cigarettes  . Smokeless tobacco: Never Used  . Alcohol Use: No     Current outpatient prescriptions:  .  amLODipine (NORVASC) 5 MG tablet, Take 5 mg by mouth daily., Disp: , Rfl: 4 .  aspirin 81 MG tablet, Take 81 mg by mouth daily., Disp: , Rfl:  .  diphenhydrAMINE (BENADRYL) 25 mg capsule, Take 25 mg by mouth every 6 (six) hours as needed., Disp: , Rfl:  .  fluticasone (FLONASE) 50 MCG/ACT nasal spray, Place 2 sprays into both nostrils daily. Dispense 3 bottles., Disp: 16 g, Rfl: 3 .  gabapentin (NEURONTIN) 100 MG capsule, Take 2 caps each night, Disp: 60 capsule, Rfl: 6 .  losartan (COZAAR) 25 MG tablet, Take 1/2 tablet daily, Disp: 30 tablet, Rfl: 6 .  meclizine (ANTIVERT) 25 MG tablet, Take 25 mg by mouth daily as needed for dizziness., Disp: , Rfl:  .  omeprazole (PRILOSEC) 20 MG capsule, Take 1 capsule (20 mg total) by mouth daily., Disp: 90 capsule, Rfl: 3  No Known Allergies  Review of Systems  Constitutional: Negative for fever, chills and malaise/fatigue.  HENT: Negative for hearing loss.   Eyes: Negative for blurred vision and double  vision.  Respiratory: Negative for sputum production, shortness of breath and wheezing.   Cardiovascular: Negative for chest pain, palpitations, orthopnea and leg swelling.  Gastrointestinal: Negative for heartburn, abdominal pain and blood in stool.  Genitourinary: Negative for dysuria, urgency and frequency.  Musculoskeletal:       L. Leg pain from hip to ankle.  Skin: Negative for rash.  Neurological: Negative for weakness and headaches.      Objective  Filed Vitals:   01/01/15 0957  BP: 138/85  Pulse: 71  Temp: 97.8 F (36.6 C)  TempSrc: Oral  Resp: 16  Height: '5\' 2"'  (1.575 m)  Weight: 168 lb (76.204 kg)     Physical Exam  Constitutional: She is well-developed, well-nourished, and in no distress. No distress.  HENT:  Head: Normocephalic and atraumatic.  Musculoskeletal:  L. Trochanteric  Pain with ROM.  Pain with superficial touch in  Other various parts of lower leg and around knee.   Vitals reviewed.     Recent Results (from the past 2160 hour(s))  CBC     Status: None   Collection Time: 11/24/14  9:45 AM  Result Value Ref Range   WBC 8.3 3.4 - 10.8 x10E3/uL  RBC 4.00 3.77 - 5.28 x10E6/uL   Hemoglobin 12.4 11.1 - 15.9 g/dL   Hematocrit 36.6 34.0 - 46.6 %   MCV 92 79 - 97 fL   MCH 31.0 26.6 - 33.0 pg   MCHC 33.9 31.5 - 35.7 g/dL   RDW 14.1 12.3 - 15.4 %   Platelets 257 150 - 379 x10E3/uL  Comprehensive Metabolic Panel (CMET)     Status: Abnormal   Collection Time: 11/24/14  9:45 AM  Result Value Ref Range   Glucose 91 65 - 99 mg/dL   BUN 18 8 - 27 mg/dL   Creatinine, Ser 1.03 (H) 0.57 - 1.00 mg/dL   GFR calc non Af Amer 56 (L) >59 mL/min/1.73   GFR calc Af Amer 65 >59 mL/min/1.73   BUN/Creatinine Ratio 17 11 - 26   Sodium 142 134 - 144 mmol/L   Potassium 4.5 3.5 - 5.2 mmol/L   Chloride 101 97 - 108 mmol/L   CO2 22 18 - 29 mmol/L   Calcium 9.7 8.7 - 10.3 mg/dL   Total Protein 6.8 6.0 - 8.5 g/dL   Albumin 4.2 3.6 - 4.8 g/dL   Globulin, Total 2.6  1.5 - 4.5 g/dL   Albumin/Globulin Ratio 1.6 1.1 - 2.5   Bilirubin Total 0.2 0.0 - 1.2 mg/dL   Alkaline Phosphatase 150 (H) 39 - 117 IU/L   AST 16 0 - 40 IU/L   ALT 15 0 - 32 IU/L  CBC with Differential     Status: None   Collection Time: 12/23/14 11:01 AM  Result Value Ref Range   WBC 8.1 3.6 - 11.0 K/uL   RBC 4.04 3.80 - 5.20 MIL/uL   Hemoglobin 12.3 12.0 - 16.0 g/dL   HCT 36.4 35.0 - 47.0 %   MCV 90.1 80.0 - 100.0 fL   MCH 30.4 26.0 - 34.0 pg   MCHC 33.8 32.0 - 36.0 g/dL   RDW 13.9 11.5 - 14.5 %   Platelets 205 150 - 440 K/uL   Neutrophils Relative % 56 %   Neutro Abs 4.5 1.4 - 6.5 K/uL   Lymphocytes Relative 33 %   Lymphs Abs 2.7 1.0 - 3.6 K/uL   Monocytes Relative 8 %   Monocytes Absolute 0.7 0.2 - 0.9 K/uL   Eosinophils Relative 2 %   Eosinophils Absolute 0.2 0 - 0.7 K/uL   Basophils Relative 1 %   Basophils Absolute 0.1 0 - 0.1 K/uL  Comprehensive metabolic panel     Status: Abnormal   Collection Time: 12/23/14 11:01 AM  Result Value Ref Range   Sodium 137 135 - 145 mmol/L   Potassium 3.6 3.5 - 5.1 mmol/L   Chloride 107 101 - 111 mmol/L   CO2 24 22 - 32 mmol/L   Glucose, Bld 122 (H) 65 - 99 mg/dL   BUN 20 6 - 20 mg/dL   Creatinine, Ser 1.16 (H) 0.44 - 1.00 mg/dL   Calcium 8.4 (L) 8.9 - 10.3 mg/dL   Total Protein 7.1 6.5 - 8.1 g/dL   Albumin 3.9 3.5 - 5.0 g/dL   AST 19 15 - 41 U/L   ALT 16 14 - 54 U/L   Alkaline Phosphatase 124 38 - 126 U/L   Total Bilirubin 0.4 0.3 - 1.2 mg/dL   GFR calc non Af Amer 47 (L) >60 mL/min   GFR calc Af Amer 54 (L) >60 mL/min    Comment: (NOTE) The eGFR has been calculated using the CKD EPI equation. This calculation  has not been validated in all clinical situations. eGFR's persistently <60 mL/min signify possible Chronic Kidney Disease.    Anion gap 6 5 - 15  Lactate dehydrogenase     Status: None   Collection Time: 12/23/14 11:01 AM  Result Value Ref Range   LDH 143 98 - 192 U/L     Assessment & Plan  1. Left leg  pain  - DG Tibia/Fibula Left; Future - DG FEMUR MIN 2 VIEWS LEFT; Future - DG HIP UNILAT WITH PELVIS 2-3 VIEWS LEFT; Future - meloxicam (MOBIC) 15 MG tablet; Take 1 tablet (15 mg total) by mouth daily.  Dispense: 30 tablet; Refill: 6 - DG Lumbar Spine Complete; Future  2. Neuropathic pain, leg, left  - gabapentin (NEURONTIN) 100 MG capsule; Take 1 capsule (100 mg total) by mouth 3 (three) times daily. Take 1 capsule twice a day and 2 at night  Dispense: 120 capsule; Refill: 6 - DG Lumbar Spine Complete; Future  3. H/O lymphoma   4. Immunization deficiency  - Pneumococcal conjugate vaccine 13-valent

## 2015-01-04 ENCOUNTER — Inpatient Hospital Stay: Payer: Medicare PPO | Admitting: Oncology

## 2015-01-05 ENCOUNTER — Telehealth: Payer: Self-pay

## 2015-01-05 NOTE — Telephone Encounter (Signed)
My notes indicate 1/2 of a 25 mg. Tab.  Use that for now.  Will follow BPs at next visit.

## 2015-01-05 NOTE — Telephone Encounter (Signed)
Pt states that she was taking Losartan 50 mg and that was need to be taken half as per Dr. Luan Pulling was suggested last visit and Rx send for take half tablet for 25 mg so pt thinks that it's wrong Rx send and I check last 2-3 visit it's says losartan 25 mg take half tablet please suggest ?

## 2015-01-06 NOTE — Telephone Encounter (Signed)
I talked with patient and the problem is resolved.   She had been taking 25 mg and tolerating it ok.  Therefore she will cont to take 1/2 of 50 mg tab that she had left over until she sees me next month.  Will stay on 25 mg/d at that time probably.-jh

## 2015-01-06 NOTE — Telephone Encounter (Signed)
Patient was on Losartan 50mg . Patient was seen 11/18/14 she had d/c Losartan b/c it caused pain per pt. Patient was seen 12/23/14 she was told to restart and take half tab. Patient's rx was written for 25 mg directions take 1/2 tab. Patient insist rx incorrect and should be 50 mg take 1/2 tab. Please advise.

## 2015-01-07 ENCOUNTER — Ambulatory Visit
Admission: RE | Admit: 2015-01-07 | Discharge: 2015-01-07 | Disposition: A | Discharge status: Home / Self Care | Payer: Medicare PPO | Source: Ambulatory Visit | Attending: Oncology | Admitting: Oncology

## 2015-01-07 DIAGNOSIS — R1011 Right upper quadrant pain: Secondary | ICD-10-CM | POA: Insufficient documentation

## 2015-01-07 DIAGNOSIS — I77819 Aortic ectasia, unspecified site: Secondary | ICD-10-CM | POA: Insufficient documentation

## 2015-01-07 DIAGNOSIS — I700 Atherosclerosis of aorta: Secondary | ICD-10-CM | POA: Insufficient documentation

## 2015-01-07 DIAGNOSIS — Z8579 Personal history of other malignant neoplasms of lymphoid, hematopoietic and related tissues: Secondary | ICD-10-CM | POA: Diagnosis present

## 2015-01-07 LAB — GLUCOSE, CAPILLARY: GLUCOSE-CAPILLARY: 95 mg/dL (ref 65–99)

## 2015-01-07 MED ORDER — FLUDEOXYGLUCOSE F - 18 (FDG) INJECTION
12.0000 | Freq: Once | INTRAVENOUS | Status: DC | PRN
  Administered 2015-01-07: 12.54 via INTRAVENOUS
  Filled 2015-01-07: qty 12

## 2015-01-08 ENCOUNTER — Ambulatory Visit: Payer: Medicare PPO

## 2015-01-12 ENCOUNTER — Inpatient Hospital Stay (HOSPITAL_BASED_OUTPATIENT_CLINIC_OR_DEPARTMENT_OTHER): Payer: Medicare PPO | Admitting: Oncology

## 2015-01-12 ENCOUNTER — Encounter: Payer: Self-pay | Admitting: Oncology

## 2015-01-12 VITALS — BP 144/86 | HR 80 | Temp 96.1°F | Wt 167.0 lb

## 2015-01-12 DIAGNOSIS — R5383 Other fatigue: Secondary | ICD-10-CM | POA: Diagnosis not present

## 2015-01-12 DIAGNOSIS — F17210 Nicotine dependence, cigarettes, uncomplicated: Secondary | ICD-10-CM

## 2015-01-12 DIAGNOSIS — M898X9 Other specified disorders of bone, unspecified site: Secondary | ICD-10-CM

## 2015-01-12 DIAGNOSIS — M549 Dorsalgia, unspecified: Secondary | ICD-10-CM

## 2015-01-12 DIAGNOSIS — Z8579 Personal history of other malignant neoplasms of lymphoid, hematopoietic and related tissues: Secondary | ICD-10-CM | POA: Diagnosis not present

## 2015-01-12 DIAGNOSIS — R509 Fever, unspecified: Secondary | ICD-10-CM

## 2015-01-12 DIAGNOSIS — R531 Weakness: Secondary | ICD-10-CM

## 2015-01-12 DIAGNOSIS — Z79899 Other long term (current) drug therapy: Secondary | ICD-10-CM

## 2015-01-12 DIAGNOSIS — Z7982 Long term (current) use of aspirin: Secondary | ICD-10-CM

## 2015-01-12 NOTE — Progress Notes (Signed)
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 (immunohistochemistry showed positive for CD20, BCL 6, weak positive for CD10. Negative for BCL-2, CD5 and CD3). bone marrow study negative for lymphom  No flowsheet data found.  INTERVAL HISTORY:   69 year old lady with a history of diffuse B large cell lymphoma in today for the follow-up overall continues to feel somewhat weak [place] has low-grade fever.  No nausea.  No vomiting.  Appetite has been fairly good.  No chills. Back pain and some diffuse bony pain.  September, 2016 Patient came today for follow-up regarding lymphoma.  Patient had a repeat PET scanning done because a number of symptoms.  Here to discuss the results and further planning of treatment  REVIEW OF SYSTEMS:    general status: Patient is feeling weak and tired.  No change in a performance status.  No chills.  No fever. HEENT:   No evidence of stomatitis Lungs: No cough or shortness of breath Cardiac: No chest pain or paroxysmal nocturnal dyspnea GI: No nausea no vomiting no diarrhea no abdominal pain Skin: No rash Lower extremity no swelling Neurological system: No tingling.  No numbness.  No other focal signs Musculoskeletal system no bony pains  As per HPI. Otherwise, a complete review of systems is negatve.  PAST MEDICAL HISTORY: Past Medical History  Diagnosis Date  . History of measles, mumps, or rubella   . History of chicken pox   . Cancer     lymphoma    PAST SURGICAL HISTORY: Past Surgical History  Procedure Laterality Date  . Gallbladder surgery  [place]  . Abdominal hysterectomy  2013  . Tonsillectomy  2010    FAMILY  HISTORY Family History  Problem Relation Age of Onset  . Diabetes Mother     sibling and grandparent also    ADVANCED DIRECTIVES:  No flowsheet data found.  HEALTH MAINTENANCE: Social History  Substance Use Topics  . Smoking status: Current Every Day Smoker -- 1.00 packs/day for 52 years    Types: Cigarettes  . Smokeless tobacco: Never Used  . Alcohol Use: No      No Known Allergies  Current Outpatient Prescriptions  Medication Sig Dispense Refill  . amLODipine (NORVASC) 5 MG tablet Take 5 mg by mouth daily.  4  . aspirin 81 MG tablet Take 81 mg by mouth daily.    . diphenhydrAMINE (BENADRYL) 25 mg capsule Take 25 mg by mouth every 6 (six) hours as needed.    . fluticasone (FLONASE) 50 MCG/ACT nasal spray Place 2 sprays into both nostrils daily. Dispense 3 bottles. 16 g 3  . gabapentin (NEURONTIN) 100 MG capsule Take 1 capsule (100 mg total) by mouth 3 (three) times daily. Take 1 capsule twice a day and 2 at night 120 capsule 6  . losartan (COZAAR) 25 MG tablet Take 1/2 tablet daily 30 tablet 6  . meclizine (ANTIVERT) 25 MG tablet Take 25 mg by mouth daily as needed for dizziness.    Marland Kitchen omeprazole (PRILOSEC) 20 MG capsule Take 1 capsule (20 mg total) by mouth daily. 90 capsule 3  . meloxicam (MOBIC) 15 MG tablet Take 1 tablet (15 mg total) by mouth daily. (  Patient not taking: Reported on 01/12/2015) 30 tablet 6   No current facility-administered medications for this visit.   Facility-Administered Medications Ordered in Other Visits  Medication Dose Route Frequency Provider Last Rate Last Dose  . fludeoxyglucose F - 18 (FDG) injection 12 milli Curie  12 milli Curie Intravenous Once PRN Medication Radiologist, MD   12.54 milli Curie at 01/07/15 0825    OBJECTIVE:  Filed Vitals:   01/12/15 1111  BP: 144/86  Pulse: 80  Temp: 96.1 F (35.6 C)     Body mass index is 30.54 kg/(m^2).    ECOG FS:1 - Symptomatic but completely ambulatory  PHYSICAL EXAM: Gen. status: Patient is  alert and oriented .  Not in any acute distress Head exam was generally normal. There was no scleral icterus or corneal arcus. Mucous membranes were moist. Lymphatic system: Supraclavicular, cervical, axillary, inguinal lymph nodes are not palpable Lungs: Air entry on both sides no palpitation, no rales Abdomen: Soft liver and spleen are palpable ,no  ascites Lower extremity no edema Neurologically, the patient was awake, alert, and oriented to person, place and time. There were no obvious focal neurologic abnormalities. Examination of the skin revealed no evidence of significant rashes, suspicious appearing nevi or other concerning lesions.   LAB RESULTS:  CBC Latest Ref Rng 12/23/2014 11/24/2014  WBC 3.6 - 11.0 K/uL 8.1 8.3  Hemoglobin 12.0 - 16.0 g/dL 12.3 -  Hematocrit 35.0 - 47.0 % 36.4 36.6  Platelets 150 - 440 K/uL 205 -    No visits with results within 5 Day(s) from this visit. Latest known visit with results is:  Hospital Outpatient Visit on 01/07/2015  Component Date Value Ref Range Status  . Glucose-Capillary 01/07/2015 95  65 - 99 mg/dL Final        ASSESSMENT: Diffuse B large cell lymphoma stage IIA  MEDICAL DECISION MAKING:  PET scan has been reviewed independently as well as reviewed with the patient and there is no evidence of recurrent or progressive disease based on PET scan. I discussed this findings with the patient.  Would like to evaluate patient in one year or before if there is any problem Total duration of visit was 20 minutes.  50% or more time was spent in counseling patient and family regarding prognosis and options of treatment and available resources.  We discussed findings of the PET scan.  And need for continuing follow-up by primary care physician for other complaints which may not be related to lymphoma   No matching staging information was found for the patient.  Forest Gleason, MD   01/12/2015 11:40 AM

## 2015-01-12 NOTE — Progress Notes (Signed)
Patient does not have living will.  Currently smokes. 

## 2015-01-17 ENCOUNTER — Encounter: Payer: Self-pay | Admitting: Oncology

## 2015-01-22 ENCOUNTER — Ambulatory Visit (INDEPENDENT_AMBULATORY_CARE_PROVIDER_SITE_OTHER): Payer: Medicare PPO | Admitting: Family Medicine

## 2015-01-22 ENCOUNTER — Encounter: Payer: Self-pay | Admitting: Family Medicine

## 2015-01-22 VITALS — BP 140/80 | HR 81 | Temp 98.0°F | Resp 16 | Ht 62.0 in | Wt 169.8 lb

## 2015-01-22 DIAGNOSIS — Z8579 Personal history of other malignant neoplasms of lymphoid, hematopoietic and related tissues: Secondary | ICD-10-CM

## 2015-01-22 DIAGNOSIS — I10 Essential (primary) hypertension: Secondary | ICD-10-CM

## 2015-01-22 DIAGNOSIS — Z720 Tobacco use: Secondary | ICD-10-CM | POA: Diagnosis not present

## 2015-01-22 DIAGNOSIS — F17200 Nicotine dependence, unspecified, uncomplicated: Secondary | ICD-10-CM

## 2015-01-22 NOTE — Progress Notes (Signed)
Name: Ashley Lynch   MRN: [id number]    DOB: 05-04-1945   Date:01/22/2015       Progress Note  Subjective  Chief Complaint  Chief Complaint  Patient presents with  . Hypertension    HPI  Here for f/u of HBP.  BPs at home running from 115-145 sys, with most <135.  She does not feel bad with lower or higher readings.    PET scan showed no signs of lymphoma at this time.  Takes Gabapentin 100 mg., 2 at night.  Still has some days and nights when  Her legs still bother her. No problem-specific assessment & plan notes found for this encounter.   Past Medical History  Diagnosis Date  . History of measles, mumps, or rubella   . History of chicken pox   . Cancer [facility])     lymphoma    Social History  Substance Use Topics  . Smoking status: Current Every Day Smoker -- 1.00 packs/day for 52 years    Types: Cigarettes  . Smokeless tobacco: Never Used  . Alcohol Use: No     Current outpatient prescriptions:  .  amLODipine (NORVASC) 5 MG tablet, Take 5 mg by mouth daily., Disp: , Rfl: 4 .  aspirin 81 MG tablet, Take 81 mg by mouth daily., Disp: , Rfl:  .  diphenhydrAMINE (BENADRYL) 25 mg capsule, Take 25 mg by mouth every 6 (six) hours as needed., Disp: , Rfl:  .  fluticasone (FLONASE) 50 MCG/ACT nasal spray, Place 2 sprays into both nostrils daily. Dispense 3 bottles., Disp: 16 g, Rfl: 3 .  gabapentin (NEURONTIN) 100 MG capsule, Take 1 capsule (100 mg total) by mouth 3 (three) times daily. Take 1 capsule twice a day and 2 at night, Disp: 120 capsule, Rfl: 6 .  losartan (COZAAR) 25 MG tablet, Take 1/2 tablet daily, Disp: 30 tablet, Rfl: 6 .  meclizine (ANTIVERT) 25 MG tablet, Take 25 mg by mouth daily as needed for dizziness., Disp: , Rfl:  .  meloxicam (MOBIC) 15 MG tablet, Take 1 tablet (15 mg total) by mouth daily. (Patient not taking: Reported on 01/12/2015), Disp: 30 tablet, Rfl: 6 .  omeprazole (PRILOSEC) 20 MG capsule, Take 1 capsule (20 mg total) by mouth daily., Disp: 90  capsule, Rfl: 3  Not on File  Review of Systems  Constitutional: Negative for fever, chills, weight loss and malaise/fatigue.  HENT: Positive for hearing loss.   Eyes: Negative for blurred vision and double vision.  Respiratory: Negative for cough, sputum production, shortness of breath and wheezing.   Cardiovascular: Negative for chest pain, palpitations and leg swelling.  Gastrointestinal: Negative for heartburn, abdominal pain and blood in stool.  Genitourinary: Negative for dysuria, urgency and frequency.  Musculoskeletal: Negative for myalgias and joint pain.  Neurological: Negative for dizziness, sensory change, focal weakness, weakness and headaches.      Objective  Filed Vitals:   01/22/15 1336  BP: 145/85  Pulse: 81  Temp: 98 F (36.7 C)  TempSrc: Oral  Resp: 16  Height: '5\' 2"'  (1.575 m)  Weight: 169 lb 12.8 oz (77.021 kg)     Physical Exam  Constitutional: She is oriented to person, place, and time and well-developed, well-nourished, and in no distress. No distress.  HENT:  Head: Normocephalic and atraumatic.  Eyes: Conjunctivae and EOM are normal. Pupils are equal, round, and reactive to light. No scleral icterus.  Neck: Normal range of motion. Neck supple. Carotid bruit is not present. No thyromegaly  present.  Cardiovascular: Normal rate, regular rhythm, normal heart sounds and intact distal pulses.  Exam reveals no gallop and no friction rub.   No murmur heard. Pulmonary/Chest: Effort normal and breath sounds normal. No respiratory distress. She has no wheezes. She has no rales.  Abdominal: Soft. Bowel sounds are normal. She exhibits no distension and no mass. There is no tenderness.  Musculoskeletal: She exhibits no edema.  Lymphadenopathy:    She has no cervical adenopathy.  Neurological: She is alert and oriented to person, place, and time.  Vitals reviewed.     Recent Results (from the past 2160 hour(s))  CBC     Status: None   Collection Time:  11/24/14  9:45 AM  Result Value Ref Range   WBC 8.3 3.4 - 10.8 x10E3/uL   RBC 4.00 3.77 - 5.28 x10E6/uL   Hemoglobin 12.4 11.1 - 15.9 g/dL   Hematocrit 36.6 34.0 - 46.6 %   MCV 92 79 - 97 fL   MCH 31.0 26.6 - 33.0 pg   MCHC 33.9 31.5 - 35.7 g/dL   RDW 14.1 12.3 - 15.4 %   Platelets 257 150 - 379 x10E3/uL  Comprehensive Metabolic Panel (CMET)     Status: Abnormal   Collection Time: 11/24/14  9:45 AM  Result Value Ref Range   Glucose 91 65 - 99 mg/dL   BUN 18 8 - 27 mg/dL   Creatinine, Ser 1.03 (H) 0.57 - 1.00 mg/dL   GFR calc non Af Amer 56 (L) >59 mL/min/1.73   GFR calc Af Amer 65 >59 mL/min/1.73   BUN/Creatinine Ratio 17 11 - 26   Sodium 142 134 - 144 mmol/L   Potassium 4.5 3.5 - 5.2 mmol/L   Chloride 101 97 - 108 mmol/L   CO2 22 18 - 29 mmol/L   Calcium 9.7 8.7 - 10.3 mg/dL   Total Protein 6.8 6.0 - 8.5 g/dL   Albumin 4.2 3.6 - 4.8 g/dL   Globulin, Total 2.6 1.5 - 4.5 g/dL   Albumin/Globulin Ratio 1.6 1.1 - 2.5   Bilirubin Total 0.2 0.0 - 1.2 mg/dL   Alkaline Phosphatase 150 (H) 39 - 117 IU/L   AST 16 0 - 40 IU/L   ALT 15 0 - 32 IU/L  CBC with Differential     Status: None   Collection Time: 12/23/14 11:01 AM  Result Value Ref Range   WBC 8.1 3.6 - 11.0 K/uL   RBC 4.04 3.80 - 5.20 MIL/uL   Hemoglobin 12.3 12.0 - 16.0 g/dL   HCT 36.4 35.0 - 47.0 %   MCV 90.1 80.0 - 100.0 fL   MCH 30.4 26.0 - 34.0 pg   MCHC 33.8 32.0 - 36.0 g/dL   RDW 13.9 11.5 - 14.5 %   Platelets 205 150 - 440 K/uL   Neutrophils Relative % 56 %   Neutro Abs 4.5 1.4 - 6.5 K/uL   Lymphocytes Relative 33 %   Lymphs Abs 2.7 1.0 - 3.6 K/uL   Monocytes Relative 8 %   Monocytes Absolute 0.7 0.2 - 0.9 K/uL   Eosinophils Relative 2 %   Eosinophils Absolute 0.2 0 - 0.7 K/uL   Basophils Relative 1 %   Basophils Absolute 0.1 0 - 0.1 K/uL  Comprehensive metabolic panel     Status: Abnormal   Collection Time: 12/23/14 11:01 AM  Result Value Ref Range   Sodium 137 135 - 145 mmol/L   Potassium 3.6 3.5 -  5.1 mmol/L   Chloride 107  101 - 111 mmol/L   CO2 24 22 - 32 mmol/L   Glucose, Bld 122 (H) 65 - 99 mg/dL   BUN 20 6 - 20 mg/dL   Creatinine, Ser 1.16 (H) 0.44 - 1.00 mg/dL   Calcium 8.4 (L) 8.9 - 10.3 mg/dL   Total Protein 7.1 6.5 - 8.1 g/dL   Albumin 3.9 3.5 - 5.0 g/dL   AST 19 15 - 41 U/L   ALT 16 14 - 54 U/L   Alkaline Phosphatase 124 38 - 126 U/L   Total Bilirubin 0.4 0.3 - 1.2 mg/dL   GFR calc non Af Amer 47 (L) >60 mL/min   GFR calc Af Amer 54 (L) >60 mL/min    Comment: (NOTE) The eGFR has been calculated using the CKD EPI equation. This calculation has not been validated in all clinical situations. eGFR's persistently <60 mL/min signify possible Chronic Kidney Disease.    Anion gap 6 5 - 15  Lactate dehydrogenase     Status: None   Collection Time: 12/23/14 11:01 AM  Result Value Ref Range   LDH 143 98 - 192 U/L  Glucose, capillary     Status: None   Collection Time: 01/07/15  8:13 AM  Result Value Ref Range   Glucose-Capillary 95 65 - 99 mg/dL     Assessment & Plan  1. Essential hypertension -cont. meds  2. H/O lymphoma Clear by current PET scan  3. Smoking _ she does not desire to stop at present.

## 2015-01-22 NOTE — Patient Instructions (Signed)
Has had flu shot already this yr.Marland Kitchen  Up to date on Pneumonia and shingles.

## 2015-05-04 ENCOUNTER — Other Ambulatory Visit: Payer: Self-pay | Admitting: *Deleted

## 2015-05-04 DIAGNOSIS — I10 Essential (primary) hypertension: Secondary | ICD-10-CM

## 2015-05-04 MED ORDER — LOSARTAN POTASSIUM 25 MG PO TABS: ORAL_TABLET | ORAL | Status: DC

## 2015-05-27 ENCOUNTER — Ambulatory Visit: Payer: Medicare PPO | Admitting: Family Medicine

## 2015-05-27 ENCOUNTER — Encounter: Payer: Self-pay | Admitting: Family Medicine

## 2015-05-27 ENCOUNTER — Ambulatory Visit (INDEPENDENT_AMBULATORY_CARE_PROVIDER_SITE_OTHER): Payer: Medicare Other | Admitting: Family Medicine

## 2015-05-27 VITALS — BP 122/75 | HR 78 | Resp 16 | Ht 63.0 in | Wt 168.4 lb

## 2015-05-27 DIAGNOSIS — H9192 Unspecified hearing loss, left ear: Secondary | ICD-10-CM

## 2015-05-27 DIAGNOSIS — Z1211 Encounter for screening for malignant neoplasm of colon: Secondary | ICD-10-CM | POA: Diagnosis not present

## 2015-05-27 DIAGNOSIS — F32A Depression, unspecified: Secondary | ICD-10-CM

## 2015-05-27 DIAGNOSIS — Z720 Tobacco use: Secondary | ICD-10-CM

## 2015-05-27 DIAGNOSIS — Z8579 Personal history of other malignant neoplasms of lymphoid, hematopoietic and related tissues: Secondary | ICD-10-CM

## 2015-05-27 DIAGNOSIS — I10 Essential (primary) hypertension: Secondary | ICD-10-CM | POA: Diagnosis not present

## 2015-05-27 DIAGNOSIS — M792 Neuralgia and neuritis, unspecified: Secondary | ICD-10-CM

## 2015-05-27 DIAGNOSIS — Z0000 Encounter for general adult medical examination without abnormal findings: Secondary | ICD-10-CM | POA: Insufficient documentation

## 2015-05-27 DIAGNOSIS — G5792 Unspecified mononeuropathy of left lower limb: Secondary | ICD-10-CM | POA: Diagnosis not present

## 2015-05-27 DIAGNOSIS — R12 Heartburn: Secondary | ICD-10-CM | POA: Diagnosis not present

## 2015-05-27 DIAGNOSIS — F17200 Nicotine dependence, unspecified, uncomplicated: Secondary | ICD-10-CM

## 2015-05-27 DIAGNOSIS — F329 Major depressive disorder, single episode, unspecified: Secondary | ICD-10-CM | POA: Insufficient documentation

## 2015-05-27 DIAGNOSIS — M7989 Other specified soft tissue disorders: Secondary | ICD-10-CM | POA: Diagnosis not present

## 2015-05-27 MED ORDER — ESCITALOPRAM OXALATE 10 MG PO TABS: 10.0000 mg | ORAL_TABLET | Freq: Every day | ORAL | Status: DC

## 2015-05-27 NOTE — Addendum Note (Signed)
Addended by: Theresia Majors A on: 05/27/2015 12:17 PM   Modules accepted: Orders, SmartSet

## 2015-05-27 NOTE — Progress Notes (Signed)
Name: Ashley Lynch   MRN: [id number]    DOB: [birth year]   Date:05/27/2015       Progress Note  Subjective  Chief Complaint  Chief Complaint  Patient presents with  . Annual Exam    HPI Here for health maintenance examination.  She declines pelvic exam ands breast exam./  She has HBP, GERD.  Takes her meds regularly.  She c/o decreased hearing in L ear.  Also has swelling in L leg for past several days with some discomfort.  No problem-specific assessment & plan notes found for this encounter.   Past Medical History  Diagnosis Date  . History of measles, mumps, or rubella   . History of chicken pox   . Cancer [facility])     lymphoma    Past Surgical History  Procedure Laterality Date  . Gallbladder surgery  [place]  . Abdominal hysterectomy  2013  . Tonsillectomy  2010    Family History  Problem Relation Age of Onset  . Diabetes Mother     sibling and grandparent also    Social History   Social History  . Marital Status: Widowed    Spouse Name: N/A  . Number of Children: N/A  . Years of Education: N/A   Occupational History  . Not on file.   Social History Main Topics  . Smoking status: Current Every Day Smoker -- 1.00 packs/day for 52 years    Types: Cigarettes  . Smokeless tobacco: Never Used  . Alcohol Use: No  . Drug Use: No  . Sexual Activity: Not on file   Other Topics Concern  . Not on file   Social History Narrative     Current outpatient prescriptions:  .  amLODipine (NORVASC) 5 MG tablet, Take 5 mg by mouth daily., Disp: , Rfl: 4 .  aspirin 81 MG tablet, Take 81 mg by mouth daily., Disp: , Rfl:  .  diphenhydrAMINE (BENADRYL) 25 mg capsule, Take 25 mg by mouth every 6 (six) hours as needed., Disp: , Rfl:  .  fluticasone (FLONASE) 50 MCG/ACT nasal spray, , Disp: , Rfl:  .  gabapentin (NEURONTIN) 100 MG capsule, Take 1 capsule (100 mg total) by mouth 3 (three) times daily. Take 1 capsule twice a day and 2 at night, Disp: 120 capsule, Rfl: 6 .   Homeopathic Products (LEG CRAMP RELIEF PO), Take by mouth., Disp: , Rfl:  .  losartan (COZAAR) 25 MG tablet, Take 1 tablet daily, Disp: 30 tablet, Rfl: 2 .  meclizine (ANTIVERT) 25 MG tablet, Take 25 mg by mouth daily as needed for dizziness. Reported on 05/27/2015, Disp: , Rfl:  .  meloxicam (MOBIC) 15 MG tablet, Take 1 tablet (15 mg total) by mouth daily., Disp: 30 tablet, Rfl: 6 .  omeprazole (PRILOSEC) 20 MG capsule, Take 1 capsule (20 mg total) by mouth daily., Disp: 90 capsule, Rfl: 3 .  escitalopram (LEXAPRO) 10 MG tablet, Take 1 tablet (10 mg total) by mouth daily., Disp: 30 tablet, Rfl: 6  Not on File   Review of Systems  Constitutional: Negative for fever, chills, weight loss and malaise/fatigue.  HENT: Negative for hearing loss.   Eyes: Negative for blurred vision and double vision.  Respiratory: Negative for cough, shortness of breath and wheezing.   Cardiovascular: Negative for chest pain, palpitations and leg swelling.  Gastrointestinal: Negative for heartburn, abdominal pain and blood in stool.  Genitourinary: Negative for dysuria, urgency and frequency.  Musculoskeletal: Negative for myalgias and joint pain.  Skin: Negative for rash.  Neurological: Negative for tremors, weakness and headaches.  Psychiatric/Behavioral: Positive for depression.      Objective  Filed Vitals:   05/27/15 1001  BP: 122/75  Pulse: 78  Resp: 16  Height: 5\' 3"  (1.6 m)  Weight: 168 lb 6.4 oz (76.386 kg)    Physical Exam  Constitutional: She is oriented to person, place, and time and well-developed, well-nourished, and in no distress. No distress.  HENT:  Head: Normocephalic and atraumatic.  Right Ear: External ear normal. No decreased hearing is noted.  Left Ear: External ear normal. Decreased hearing is noted.  Nose: Nose normal.  Mouth/Throat: Oropharynx is clear and moist.  Eyes: Conjunctivae and EOM are normal. Pupils are equal, round, and reactive to light. No scleral icterus.   Fundoscopic exam:      The right eye shows no arteriolar narrowing, no AV nicking, no exudate, no hemorrhage and no papilledema.       The left eye shows no arteriolar narrowing, no AV nicking, no exudate, no hemorrhage and no papilledema.  Neck: Normal range of motion. Neck supple. Carotid bruit is present (? bilateral).  Cardiovascular: Normal rate, regular rhythm and normal heart sounds.  Exam reveals no gallop and no friction rub.   No murmur heard. Pulmonary/Chest: Effort normal and breath sounds normal. No respiratory distress. She has no wheezes. She has no rales.  Patient declined breast exam ands mammogram  Abdominal: Soft. Bowel sounds are normal. She exhibits no distension and no mass. There is no tenderness.  Genitourinary:  Patient declined pelvic exam.  Musculoskeletal: She exhibits edema (1+ painful edema of L leg).  Neurological: She is alert and oriented to person, place, and time. No cranial nerve deficit.  Skin: Skin is warm and dry.  Psychiatric:  Depressed affect.  No suicidal ideations.  Sad and blue.  Decreased motivation .  Somewhat reclusive.  Vitals reviewed.      No results found for this or any previous visit (from the past 2160 hour(s)).   Assessment & Plan  Problem List Items Addressed This Visit      Cardiovascular and Mediastinum   Hypertension   Relevant Orders   Comprehensive Metabolic Panel (CMET)   Lipid Profile     Other   H/O lymphoma   Heartburn   Relevant Orders   CBC with Differential   Smoking   Neuropathic pain, leg   Health maintenance examination - Primary   Depression   Relevant Medications   escitalopram (LEXAPRO) 10 MG tablet   Colon cancer screening   Relevant Orders   Cologuard   Decreased hearing of left ear   Relevant Orders   Ambulatory referral to ENT   Left leg swelling   Relevant Orders   Ambulatory referral to Vascular Surgery      Meds ordered this encounter  Medications  . DISCONTD:  cyclobenzaprine (FLEXERIL) 10 MG tablet    Sig:   . DISCONTD: sertraline (ZOLOFT) 50 MG tablet    Sig:   . fluticasone (FLONASE) 50 MCG/ACT nasal spray    Sig:   . Homeopathic Products (LEG CRAMP RELIEF PO)    Sig: Take by mouth.  . escitalopram (LEXAPRO) 10 MG tablet    Sig: Take 1 tablet (10 mg total) by mouth daily.    Dispense:  30 tablet    Refill:  6   1. Colon cancer screening  - Cologuard; Future  2. Health maintenance examination   3. Depression  - escitalopram (  LEXAPRO) 10 MG tablet; Take 1 tablet (10 mg total) by mouth daily.  Dispense: 30 tablet; Refill: 6  4. Essential hypertension  - Comprehensive Metabolic Panel (CMET) - Lipid Profile  5. Heartburn  - CBC with Differential  6. Neuropathic pain, leg, left   7. Smoking   8. H/O lymphoma   9. Decreased hearing of left ear  - Ambulatory referral to ENT  10. Left leg swelling  - Ambulatory referral to Vascular Surgery  Continue all of her current medications at current doses.

## 2015-06-01 ENCOUNTER — Other Ambulatory Visit: Payer: Self-pay

## 2015-06-01 DIAGNOSIS — Z1211 Encounter for screening for malignant neoplasm of colon: Secondary | ICD-10-CM

## 2015-06-07 ENCOUNTER — Other Ambulatory Visit: Payer: Self-pay | Admitting: *Deleted

## 2015-06-07 DIAGNOSIS — I10 Essential (primary) hypertension: Secondary | ICD-10-CM

## 2015-06-07 MED ORDER — LOSARTAN POTASSIUM 25 MG PO TABS: ORAL_TABLET | ORAL | Status: DC

## 2015-06-10 ENCOUNTER — Other Ambulatory Visit: Payer: Self-pay | Admitting: Family Medicine

## 2015-06-10 ENCOUNTER — Encounter: Payer: Self-pay | Admitting: Family Medicine

## 2015-06-10 DIAGNOSIS — M7989 Other specified soft tissue disorders: Secondary | ICD-10-CM

## 2015-06-24 LAB — CBC WITH DIFFERENTIAL/PLATELET
Basophils Absolute: 0.1 10*3/uL (ref 0.0–0.2)
Basos: 1 %
EOS (ABSOLUTE): 0.1 10*3/uL (ref 0.0–0.4)
EOS: 2 %
HEMATOCRIT: 35.7 % (ref 34.0–46.6)
Hemoglobin: 12 g/dL (ref 11.1–15.9)
Immature Grans (Abs): 0 10*3/uL (ref 0.0–0.1)
Immature Granulocytes: 0 %
LYMPHS: 29 %
Lymphocytes Absolute: 2.1 10*3/uL (ref 0.7–3.1)
MCH: 30.7 pg (ref 26.6–33.0)
MCHC: 33.6 g/dL (ref 31.5–35.7)
MCV: 91 fL (ref 79–97)
Monocytes Absolute: 0.7 10*3/uL (ref 0.1–0.9)
Monocytes: 10 %
Neutrophils Absolute: 4.1 10*3/uL (ref 1.4–7.0)
Neutrophils: 58 %
PLATELETS: 248 10*3/uL (ref 150–379)
RBC: 3.91 x10E6/uL (ref 3.77–5.28)
RDW: 14.3 % (ref 12.3–15.4)
WBC: 7.1 10*3/uL (ref 3.4–10.8)

## 2015-06-24 LAB — COMPREHENSIVE METABOLIC PANEL
A/G RATIO: 1.6 (ref 1.1–2.5)
ALK PHOS: 163 IU/L — AB (ref 39–117)
ALT: 15 IU/L (ref 0–32)
AST: 18 IU/L (ref 0–40)
Albumin: 4.2 g/dL (ref 3.6–4.8)
BILIRUBIN TOTAL: 0.3 mg/dL (ref 0.0–1.2)
BUN / CREAT RATIO: 15 (ref 11–26)
BUN: 17 mg/dL (ref 8–27)
CHLORIDE: 98 mmol/L (ref 96–106)
CO2: 18 mmol/L (ref 18–29)
Calcium: 9.7 mg/dL (ref 8.7–10.3)
Creatinine, Ser: 1.13 mg/dL — ABNORMAL HIGH (ref 0.57–1.00)
GFR calc non Af Amer: 50 mL/min/{1.73_m2} — ABNORMAL LOW (ref >59)
GFR, EST AFRICAN AMERICAN: 57 mL/min/{1.73_m2} — AB (ref >59)
GLUCOSE: 89 mg/dL (ref 65–99)
Globulin, Total: 2.7 g/dL (ref 1.5–4.5)
POTASSIUM: 4.9 mmol/L (ref 3.5–5.2)
Sodium: 136 mmol/L (ref 134–144)
TOTAL PROTEIN: 6.9 g/dL (ref 6.0–8.5)

## 2015-06-24 LAB — LIPID PANEL
CHOLESTEROL TOTAL: 186 mg/dL (ref 100–199)
Chol/HDL Ratio: 5.3 ratio units — ABNORMAL HIGH (ref 0.0–4.4)
HDL: 35 mg/dL — ABNORMAL LOW (ref >39)
LDL Calculated: 121 mg/dL — ABNORMAL HIGH (ref 0–99)
Triglycerides: 148 mg/dL (ref 0–149)
VLDL Cholesterol Cal: 30 mg/dL (ref 5–40)

## 2015-06-28 ENCOUNTER — Other Ambulatory Visit: Payer: Medicare PPO

## 2015-06-28 ENCOUNTER — Ambulatory Visit: Payer: Medicare PPO | Admitting: Oncology

## 2015-07-15 ENCOUNTER — Telehealth: Payer: Self-pay | Admitting: *Deleted

## 2015-07-15 NOTE — Telephone Encounter (Signed)
Colorguard--negative repeat in 3 years.[place]

## 2015-07-26 LAB — HM DIABETES EYE EXAM

## 2015-07-29 ENCOUNTER — Encounter: Payer: Self-pay | Admitting: Family Medicine

## 2015-08-26 ENCOUNTER — Encounter: Payer: Self-pay | Admitting: Family Medicine

## 2015-08-26 ENCOUNTER — Ambulatory Visit (INDEPENDENT_AMBULATORY_CARE_PROVIDER_SITE_OTHER): Payer: Medicare Other | Admitting: Family Medicine

## 2015-08-26 VITALS — BP 102/69 | HR 69 | Temp 97.2°F | Resp 16 | Ht 63.0 in | Wt 160.0 lb

## 2015-08-26 DIAGNOSIS — M7989 Other specified soft tissue disorders: Secondary | ICD-10-CM

## 2015-08-26 DIAGNOSIS — G5792 Unspecified mononeuropathy of left lower limb: Secondary | ICD-10-CM | POA: Diagnosis not present

## 2015-08-26 DIAGNOSIS — F32A Depression, unspecified: Secondary | ICD-10-CM

## 2015-08-26 DIAGNOSIS — F329 Major depressive disorder, single episode, unspecified: Secondary | ICD-10-CM

## 2015-08-26 DIAGNOSIS — G5790 Unspecified mononeuropathy of unspecified lower limb: Secondary | ICD-10-CM | POA: Diagnosis not present

## 2015-08-26 DIAGNOSIS — I10 Essential (primary) hypertension: Secondary | ICD-10-CM | POA: Diagnosis not present

## 2015-08-26 DIAGNOSIS — M792 Neuralgia and neuritis, unspecified: Secondary | ICD-10-CM

## 2015-08-26 MED ORDER — ESCITALOPRAM OXALATE 10 MG PO TABS: 10.0000 mg | ORAL_TABLET | Freq: Every day | ORAL | Status: DC

## 2015-08-26 MED ORDER — GABAPENTIN 100 MG PO CAPS: ORAL_CAPSULE | ORAL | Status: DC

## 2015-08-26 NOTE — Progress Notes (Signed)
Name: Ashley Lynch   MRN: [id number]    DOB: [birth year]   Date:08/26/2015       Progress Note  Subjective  Chief Complaint  Chief Complaint  Patient presents with  . Hyperlipidemia  . Elevated Hepatic Enzymes    alt. ptase  . Depression    pt never picked up Lexapro    HPI Here for f/u of HBP.   Also with c/o leg pain and weakness, hearing loss.  Hass 3-6 loose stools a day 2 days a week. Has hx. Of IBS  She cannot attribute to any dietary change.  She has been told that she needs a hearing aid but she says hat she cannot afford.  Vascular study said no   Arterial blockages. She is depressed with an\xiaety. No problem-specific assessment & plan notes found for this encounter.   Past Medical History  Diagnosis Date  . History of measles, mumps, or rubella   . History of chicken pox   . Cancer [facility])     lymphoma    Past Surgical History  Procedure Laterality Date  . Gallbladder surgery  [place]  . Abdominal hysterectomy  2013  . Tonsillectomy  2010    Family History  Problem Relation Age of Onset  . Diabetes Mother     sibling and grandparent also    Social History   Social History  . Marital Status: Widowed    Spouse Name: N/A  . Number of Children: N/A  . Years of Education: N/A   Occupational History  . Not on file.   Social History Main Topics  . Smoking status: Current Every Day Smoker -- 1.00 packs/day for 52 years    Types: Cigarettes  . Smokeless tobacco: Never Used  . Alcohol Use: No  . Drug Use: No  . Sexual Activity: Not on file   Other Topics Concern  . Not on file   Social History Narrative     Current outpatient prescriptions:  .  amLODipine (NORVASC) 5 MG tablet, Take 5 mg by mouth daily., Disp: , Rfl: 4 .  aspirin 81 MG tablet, Take 81 mg by mouth daily., Disp: , Rfl:  .  diphenhydrAMINE (BENADRYL) 25 mg capsule, Take 25 mg by mouth every 6 (six) hours as needed., Disp: , Rfl:  .  fluticasone (FLONASE) 50 MCG/ACT nasal spray, ,  Disp: , Rfl:  .  gabapentin (NEURONTIN) 100 MG capsule, Take 2 capsules three times a day., Disp: 180 capsule, Rfl: 6 .  Homeopathic Products (LEG CRAMP RELIEF PO), Take by mouth., Disp: , Rfl:  .  losartan (COZAAR) 25 MG tablet, Take 1 tablet daily, Disp: 90 tablet, Rfl: 0 .  meclizine (ANTIVERT) 25 MG tablet, Take 25 mg by mouth daily as needed for dizziness. Reported on 05/27/2015, Disp: , Rfl:  .  meloxicam (MOBIC) 15 MG tablet, Take 1 tablet (15 mg total) by mouth daily. (Patient taking differently: Take 15 mg by mouth as needed. ), Disp: 30 tablet, Rfl: 6 .  omeprazole (PRILOSEC) 20 MG capsule, Take 1 capsule (20 mg total) by mouth daily., Disp: 90 capsule, Rfl: 3 .  escitalopram (LEXAPRO) 10 MG tablet, Take 1 tablet (10 mg total) by mouth daily., Disp: 30 tablet, Rfl: 6  Not on File   Review of Systems  Constitutional: Negative for fever, chills, weight loss and malaise/fatigue.  HENT: Negative for hearing loss.   Eyes: Negative for blurred vision and double vision.  Respiratory: Negative for cough, shortness of  breath and wheezing.   Cardiovascular: Negative for chest pain, palpitations and leg swelling.  Gastrointestinal: Negative for heartburn, abdominal pain and blood in stool.  Genitourinary: Negative for dysuria, urgency and frequency.  Musculoskeletal: Negative for myalgias and joint pain.  Skin: Negative for rash.  Neurological: Negative for dizziness, tremors, weakness and headaches.  Psychiatric/Behavioral: Positive for depression.      Objective  Filed Vitals:   08/26/15 1102  BP: 102/69  Pulse: 69  Temp: 97.2 F (36.2 C)  TempSrc: Oral  Resp: 16  Height: 5\' 3"  (1.6 m)  Weight: 160 lb (72.576 kg)    Physical Exam  Constitutional: She is oriented to person, place, and time and well-developed, well-nourished, and in no distress. No distress.  HENT:  Head: Normocephalic and atraumatic.  Eyes: Conjunctivae and EOM are normal. Pupils are equal, round, and  reactive to light. No scleral icterus.  Neck: Normal range of motion. Neck supple. Carotid bruit is not present. No thyromegaly present.  Cardiovascular: Normal rate, regular rhythm and normal heart sounds.  Exam reveals no gallop and no friction rub.   No murmur heard. Poor peripheral pulses  Pulmonary/Chest: Effort normal and breath sounds normal. No respiratory distress. She has no wheezes. She has no rales.  Musculoskeletal: She exhibits edema (trace edema R foot and ankle).  Lymphadenopathy:    She has no cervical adenopathy.  Neurological: She is alert and oriented to person, place, and time.  Vitals reviewed.      Recent Results (from the past 2160 hour(s))  Comprehensive Metabolic Panel (CMET)     Status: Abnormal   Collection Time: 06/23/15  9:10 AM  Result Value Ref Range   Glucose 89 65 - 99 mg/dL   BUN 17 8 - 27 mg/dL   Creatinine, Ser 1.13 (H) 0.57 - 1.00 mg/dL   GFR calc non Af Amer 50 (L) >59 mL/min/1.73   GFR calc Af Amer 57 (L) >59 mL/min/1.73   BUN/Creatinine Ratio 15 11 - 26   Sodium 136 134 - 144 mmol/L   Potassium 4.9 3.5 - 5.2 mmol/L   Chloride 98 96 - 106 mmol/L   CO2 18 18 - 29 mmol/L   Calcium 9.7 8.7 - 10.3 mg/dL   Total Protein 6.9 6.0 - 8.5 g/dL   Albumin 4.2 3.6 - 4.8 g/dL   Globulin, Total 2.7 1.5 - 4.5 g/dL   Albumin/Globulin Ratio 1.6 1.1 - 2.5    Comment: **Effective June 28, 2015 the reference interval**   for A/G Ratio will be changing to:              Age                Female          Female           0 -  7 days       1.1 - 2.3       1.1 - 2.3           8 - 30 days       1.2 - 2.8       1.2 - 2.8           1 -  6 months     1.3 - 3.6       1.3 - 3.6    7 months -  5 years      1.5 - 2.6       1.5 - 2.6              >  5 years      1.2 - 2.2       1.2 - 2.2    Bilirubin Total 0.3 0.0 - 1.2 mg/dL   Alkaline Phosphatase 163 (H) 39 - 117 IU/L   AST 18 0 - 40 IU/L   ALT 15 0 - 32 IU/L  Lipid Profile     Status: Abnormal   Collection  Time: 06/23/15  9:10 AM  Result Value Ref Range   Cholesterol, Total 186 100 - 199 mg/dL   Triglycerides 148 0 - 149 mg/dL   HDL 35 (L) >39 mg/dL   VLDL Cholesterol Cal 30 5 - 40 mg/dL   LDL Calculated 121 (H) 0 - 99 mg/dL   Chol/HDL Ratio 5.3 (H) 0.0 - 4.4 ratio units    Comment:                                   T. Chol/HDL Ratio                                             Men  Women                               1/2 Avg.Risk  3.4    3.3                                   Avg.Risk  5.0    4.4                                2X Avg.Risk  9.6    7.1                                3X Avg.Risk 23.4   11.0   CBC with Differential     Status: None   Collection Time: 06/23/15  9:10 AM  Result Value Ref Range   WBC 7.1 3.4 - 10.8 x10E3/uL   RBC 3.91 3.77 - 5.28 x10E6/uL   Hemoglobin 12.0 11.1 - 15.9 g/dL   Hematocrit 35.7 34.0 - 46.6 %   MCV 91 79 - 97 fL   MCH 30.7 26.6 - 33.0 pg   MCHC 33.6 31.5 - 35.7 g/dL   RDW 14.3 12.3 - 15.4 %   Platelets 248 150 - 379 x10E3/uL   Neutrophils 58 %   Lymphs 29 %   Monocytes 10 %   Eos 2 %   Basos 1 %   Neutrophils Absolute 4.1 1.4 - 7.0 x10E3/uL   Lymphocytes Absolute 2.1 0.7 - 3.1 x10E3/uL   Monocytes Absolute 0.7 0.1 - 0.9 x10E3/uL   EOS (ABSOLUTE) 0.1 0.0 - 0.4 x10E3/uL   Basophils Absolute 0.1 0.0 - 0.2 x10E3/uL   Immature Granulocytes 0 %   Immature Grans (Abs) 0.0 0.0 - 0.1 x10E3/uL  HM DIABETES EYE EXAM     Status: None   Collection Time: 07/26/15 12:00 AM  Result Value Ref Range   HM Diabetic Eye Exam No Retinopathy No Retinopathy     Assessment & Plan  Problem  List Items Addressed This Visit      Cardiovascular and Mediastinum   Essential hypertension - Primary     Other   Neuropathic pain, leg   Relevant Medications   gabapentin (NEURONTIN) 100 MG capsule   Depression   Relevant Medications   escitalopram (LEXAPRO) 10 MG tablet   Left leg swelling      Meds ordered this encounter  Medications  . DISCONTD:  sertraline (ZOLOFT) 50 MG tablet    Sig: Take 50 mg by mouth as needed. Reported on 08/26/2015    Refill:  0  . gabapentin (NEURONTIN) 100 MG capsule    Sig: Take 2 capsules three times a day.    Dispense:  180 capsule    Refill:  6  . escitalopram (LEXAPRO) 10 MG tablet    Sig: Take 1 tablet (10 mg total) by mouth daily.    Dispense:  30 tablet    Refill:  6

## 2015-10-21 ENCOUNTER — Other Ambulatory Visit: Payer: Self-pay | Admitting: Family Medicine

## 2015-10-21 DIAGNOSIS — K219 Gastro-esophageal reflux disease without esophagitis: Secondary | ICD-10-CM

## 2015-10-21 MED ORDER — OMEPRAZOLE 20 MG PO CPDR: 20.0000 mg | DELAYED_RELEASE_CAPSULE | Freq: Every day | ORAL | Status: DC

## 2015-11-01 ENCOUNTER — Ambulatory Visit (INDEPENDENT_AMBULATORY_CARE_PROVIDER_SITE_OTHER): Payer: Medicare Other | Admitting: Family Medicine

## 2015-11-01 ENCOUNTER — Encounter: Payer: Self-pay | Admitting: Family Medicine

## 2015-11-01 VITALS — BP 140/70 | HR 76 | Temp 97.9°F | Resp 16 | Ht 63.0 in | Wt 161.8 lb

## 2015-11-01 DIAGNOSIS — R079 Chest pain, unspecified: Secondary | ICD-10-CM | POA: Insufficient documentation

## 2015-11-01 DIAGNOSIS — I10 Essential (primary) hypertension: Secondary | ICD-10-CM | POA: Diagnosis not present

## 2015-11-01 DIAGNOSIS — Z8579 Personal history of other malignant neoplasms of lymphoid, hematopoietic and related tissues: Secondary | ICD-10-CM

## 2015-11-01 NOTE — Progress Notes (Signed)
Name: Ashley Lynch   MRN: [id number]    DOB: 1945/10/14   Date:11/01/2015       Progress Note  Subjective  Chief Complaint  Chief Complaint  Patient presents with  . Hypertension    HPI Here for f/u of HBP.  She is through with her treatments since 2011.  She has been in remission for 6 yeas.  She still smokes.  BPs at home running 130-140 sys.  She had readings of closer to 100 sys. With Losartan.  Stopped Losartan and felt better with m ore energy, but started back taking 1/2 of 25 mg. Tab of Losartan b/o BPs in 150s sys.  Also c/o omne month of intermittant L ant chest pains.  Lasts a few min then resolves.  Not related to activity that she is aware of.    No problem-specific assessment & plan notes found for this encounter.   Past Medical History  Diagnosis Date  . History of measles, mumps, or rubella   . History of chicken pox   . Cancer [facility])     lymphoma    Past Surgical History  Procedure Laterality Date  . Gallbladder surgery  [place]  . Abdominal hysterectomy  2013  . Tonsillectomy  2010    Family History  Problem Relation Age of Onset  . Diabetes Mother     sibling and grandparent also    Social History   Social History  . Marital Status: Widowed    Spouse Name: N/A  . Number of Children: N/A  . Years of Education: N/A   Occupational History  . Not on file.   Social History Main Topics  . Smoking status: Current Every Day Smoker -- 1.00 packs/day for 52 years    Types: Cigarettes  . Smokeless tobacco: Never Used  . Alcohol Use: No  . Drug Use: No  . Sexual Activity: Not on file   Other Topics Concern  . Not on file   Social History Narrative     Current outpatient prescriptions:  .  amLODipine (NORVASC) 5 MG tablet, Take 5 mg by mouth daily., Disp: , Rfl: 4 .  aspirin 81 MG tablet, Take 81 mg by mouth daily., Disp: , Rfl:  .  diphenhydrAMINE (BENADRYL) 25 mg capsule, Take 25 mg by mouth every 6 (six) hours as needed., Disp: , Rfl:  .   fluticasone (FLONASE) 50 MCG/ACT nasal spray, , Disp: , Rfl:  .  gabapentin (NEURONTIN) 100 MG capsule, Take 2 capsules three times a day., Disp: 180 capsule, Rfl: 6 .  Homeopathic Products (LEG CRAMP RELIEF PO), Take by mouth., Disp: , Rfl:  .  losartan (COZAAR) 25 MG tablet, Take 1 tablet daily (Patient taking differently: Take 12.5 mg by mouth daily. Take 1/2 tablet daily), Disp: 90 tablet, Rfl: 0 .  meclizine (ANTIVERT) 25 MG tablet, Take 25 mg by mouth daily as needed for dizziness. Reported on 05/27/2015, Disp: , Rfl:  .  meloxicam (MOBIC) 15 MG tablet, Take 1 tablet (15 mg total) by mouth daily. (Patient taking differently: Take 15 mg by mouth as needed. ), Disp: 30 tablet, Rfl: 6 .  omeprazole (PRILOSEC) 20 MG capsule, Take 1 capsule (20 mg total) by mouth daily., Disp: 90 capsule, Rfl: 3  Not on File   Review of Systems  Constitutional: Positive for malaise/fatigue. Negative for fever, chills and weight loss.  HENT: Negative for hearing loss.   Eyes: Negative for blurred vision and double vision.  Respiratory: Negative  for cough, shortness of breath and wheezing.   Cardiovascular: Positive for chest pain (intermittant pain in L chest, occ with bending over.). Negative for palpitations and leg swelling.  Gastrointestinal: Negative for heartburn, abdominal pain and blood in stool.  Genitourinary: Negative for dysuria, urgency and frequency.  Skin: Negative for rash.  Neurological: Negative for dizziness, tremors, weakness and headaches.      Objective  Filed Vitals:   11/01/15 1302 11/01/15 1339  BP: 149/76 140/70  Pulse: 76   Temp: 97.9 F (36.6 C)   TempSrc: Oral   Resp: 16   Height: 5\' 3"  (1.6 m)   Weight: 161 lb 12.8 oz (73.392 kg)     Physical Exam  Constitutional: She is oriented to person, place, and time and well-developed, well-nourished, and in no distress. No distress.  HENT:  Head: Normocephalic and atraumatic.  Eyes: Conjunctivae and EOM are normal.  Pupils are equal, round, and reactive to light. No scleral icterus.  Neck: Normal range of motion. Neck supple. Carotid bruit is not present. No thyromegaly present.  Cardiovascular: Normal rate, regular rhythm and normal heart sounds.  Exam reveals no gallop and no friction rub.   No murmur heard. Pulmonary/Chest: Effort normal and breath sounds normal. No respiratory distress. She has no wheezes. She exhibits no tenderness.  Abdominal: Soft. Bowel sounds are normal. She exhibits no distension and no mass. There is no tenderness.  Musculoskeletal: Normal range of motion. She exhibits edema (trace bilateral pedal edema.).  Lymphadenopathy:    She has no cervical adenopathy.  Neurological: She is alert and oriented to person, place, and time.  Vitals reviewed.      No results found for this or any previous visit (from the past 2160 hour(s)).   Assessment & Plan  Problem List Items Addressed This Visit      Cardiovascular and Mediastinum   Essential hypertension - Primary     Other   H/O lymphoma   Chest pain   Relevant Orders   EKG 12-Lead      No orders of the defined types were placed in this encounter.   1. Essential hypertension Cont  Amlodipine and losartan  2. H/O lymphoma   3. Chest pain, unspecified chest pain type  - EKG 12-Lead-wnl

## 2015-11-04 ENCOUNTER — Other Ambulatory Visit: Payer: Self-pay | Admitting: *Deleted

## 2015-11-04 DIAGNOSIS — I10 Essential (primary) hypertension: Secondary | ICD-10-CM

## 2015-11-04 MED ORDER — LOSARTAN POTASSIUM 25 MG PO TABS: ORAL_TABLET | ORAL | Status: DC

## 2016-01-10 ENCOUNTER — Other Ambulatory Visit: Payer: Self-pay | Admitting: Family Medicine

## 2016-01-12 ENCOUNTER — Other Ambulatory Visit: Payer: Medicare PPO

## 2016-01-12 ENCOUNTER — Ambulatory Visit: Payer: Medicare PPO | Admitting: Oncology

## 2016-01-12 DIAGNOSIS — C8330 Diffuse large B-cell lymphoma, unspecified site: Secondary | ICD-10-CM | POA: Insufficient documentation

## 2016-01-12 NOTE — Progress Notes (Signed)
[name]  Telephone:([phone]) [phone] Fax:(336) [phone]  ID: Ashley Lynch OB: 1945/09/20  MR#: [id number]  [id number]  Patient Care Team: Arlis Porta., MD as PCP - General (Family Medicine)  CHIEF COMPLAINT: Stage IIa DLBCL of left tonsil  INTERVAL HISTORY: Patient returns to clinic today for routine yearly follow-up. She has multiple complaints, that are unrelated to her history of lymphoma. She currently feels well. She has no neurologic complaint. She denies any recent fevers or illnesses. She denies any night sweats or weight loss. She denies any pain. She has no dysphasia or difficulty swallowing. She has no chest pain or shortness of breath. She denies any nausea, vomiting, constipation, or diarrhea. She has no urinary complaints. Patient feels at her baseline and offers no specific complaints today.  REVIEW OF SYSTEMS:   Review of Systems  Constitutional: Negative.  Negative for fever, malaise/fatigue and weight loss.  Respiratory: Negative.  Negative for cough and shortness of breath.   Cardiovascular: Negative.  Negative for chest pain and leg swelling.  Gastrointestinal: Negative.  Negative for abdominal pain.  Genitourinary: Negative.   Musculoskeletal: Negative.   Neurological: Negative.  Negative for sensory change and weakness.  Psychiatric/Behavioral: Negative.  The patient is not nervous/anxious.     As per HPI. Otherwise, a complete review of systems is negative.  PAST MEDICAL HISTORY: Past Medical History:  Diagnosis Date  . Cancer ([employer])    lymphoma  . History of chicken pox   . History of measles, mumps, or rubella     PAST SURGICAL HISTORY: Past Surgical History:  Procedure Laterality Date  . ABDOMINAL HYSTERECTOMY  2013  . [place]  . TONSILLECTOMY  2010    FAMILY HISTORY: Family History  Problem Relation Age of Onset  . Diabetes Mother     sibling and grandparent also    ADVANCED DIRECTIVES  (Y/N):  N  HEALTH MAINTENANCE: Social History  Substance Use Topics  . Smoking status: Current Every Day Smoker    Packs/day: 1.00    Years: 52.00    Types: Cigarettes  . Smokeless tobacco: Never Used  . Alcohol use No     Colonoscopy:  PAP:  Bone density:  Lipid panel:  Not on File  Current Outpatient Prescriptions  Medication Sig Dispense Refill  . amLODipine (NORVASC) 5 MG tablet Take 5 mg by mouth daily.  4  . aspirin 81 MG tablet Take 81 mg by mouth daily.    . diphenhydrAMINE (BENADRYL) 25 mg capsule Take 25 mg by mouth every 6 (six) hours as needed.    . fluticasone (FLONASE) 50 MCG/ACT nasal spray instill 2 sprays into each nostril once daily 48 g 3  . Homeopathic Products (LEG CRAMP RELIEF PO) Take by mouth.    . losartan (COZAAR) 25 MG tablet Take 1/2 tablet daily. 90 tablet 2  . omeprazole (PRILOSEC) 20 MG capsule Take 1 capsule (20 mg total) by mouth daily. 90 capsule 3  . gabapentin (NEURONTIN) 100 MG capsule Take 2 capsules three times a day. (Patient not taking: Reported on 01/14/2016) 180 capsule 6  . meclizine (ANTIVERT) 25 MG tablet Take 25 mg by mouth daily as needed for dizziness. Reported on 05/27/2015    . meloxicam (MOBIC) 15 MG tablet Take 1 tablet (15 mg total) by mouth daily. (Patient taking differently: Take 15 mg by mouth as needed. ) 30 tablet 6   No current facility-administered medications for this visit.  OBJECTIVE: Vitals:   01/14/16 1115  BP: 139/75  Pulse: 65  Temp: 97.9 F (36.6 C)     Body mass index is 28.51 kg/m.    ECOG FS:0 - Asymptomatic  General: Well-developed, well-nourished, no acute distress. Eyes: Pink conjunctiva, anicteric sclera. HEENT: Normocephalic, moist mucous membranes, clear oropharnyx. Lungs: Clear to auscultation bilaterally. Heart: Regular rate and rhythm. No rubs, murmurs, or gallops. Abdomen: Soft, nontender, nondistended. No organomegaly noted, normoactive bowel sounds. Musculoskeletal: No edema,  cyanosis, or clubbing. Neuro: Alert, answering all questions appropriately. Cranial nerves grossly intact. Skin: No rashes or petechiae noted. Psych: Normal affect. Lymphatics: No cervical, calvicular, axillary or inguinal LAD.   LAB RESULTS:  Lab Results  Component Value Date   NA 136 01/14/2016   K 3.7 01/14/2016   CL 103 01/14/2016   CO2 24 01/14/2016   GLUCOSE 101 (H) 01/14/2016   BUN 19 01/14/2016   CREATININE 1.14 (H) 01/14/2016   CALCIUM 9.0 01/14/2016   PROT 7.4 01/14/2016   ALBUMIN 4.1 01/14/2016   AST 19 01/14/2016   ALT 15 01/14/2016   ALKPHOS 143 (H) 01/14/2016   BILITOT 0.5 01/14/2016   GFRNONAA 48 (L) 01/14/2016   GFRAA 55 (L) 01/14/2016    Lab Results  Component Value Date   WBC 8.5 01/14/2016   NEUTROABS 4.8 01/14/2016   HGB 12.2 01/14/2016   HCT 35.9 01/14/2016   MCV 90.2 01/14/2016   PLT 207 01/14/2016     STUDIES: No results found.  ASSESSMENT: Stage IIa DLBCL of left tonsil  PLAN:    1. Stage IIa DLBCL of left tonsil: Patient is status post tonsillectomy in July 2010. She also reports she completed chemotherapy and initiated XRT, but did not complete the recommended course. She had a PET scan in September 2016 that did not reveal any evidence of recurrence.  Patient is now well over 5 years removed from completing her treatments. After lengthy discussion with the patient, she agreed that no further follow-up is necessary and she can be discharged from clinic. Please refer her back if there are any questions or concerns.  Patient expressed understanding and was in agreement with this plan. She also understands that She can call clinic at any time with any questions, concerns, or complaints.    Lloyd Huger, MD   01/14/2016 1:10 PM

## 2016-01-13 ENCOUNTER — Other Ambulatory Visit: Payer: Self-pay | Admitting: *Deleted

## 2016-01-13 DIAGNOSIS — C8330 Diffuse large B-cell lymphoma, unspecified site: Secondary | ICD-10-CM

## 2016-01-14 ENCOUNTER — Inpatient Hospital Stay: Payer: Medicare Other

## 2016-01-14 ENCOUNTER — Inpatient Hospital Stay: Payer: Medicare Other | Attending: Oncology | Admitting: Oncology

## 2016-01-14 VITALS — BP 139/75 | HR 65 | Temp 97.9°F | Wt 160.9 lb

## 2016-01-14 DIAGNOSIS — Z79899 Other long term (current) drug therapy: Secondary | ICD-10-CM | POA: Insufficient documentation

## 2016-01-14 DIAGNOSIS — F17210 Nicotine dependence, cigarettes, uncomplicated: Secondary | ICD-10-CM | POA: Insufficient documentation

## 2016-01-14 DIAGNOSIS — Z7982 Long term (current) use of aspirin: Secondary | ICD-10-CM | POA: Insufficient documentation

## 2016-01-14 DIAGNOSIS — C8330 Diffuse large B-cell lymphoma, unspecified site: Secondary | ICD-10-CM

## 2016-01-14 DIAGNOSIS — Z8619 Personal history of other infectious and parasitic diseases: Secondary | ICD-10-CM | POA: Insufficient documentation

## 2016-01-14 DIAGNOSIS — Z8579 Personal history of other malignant neoplasms of lymphoid, hematopoietic and related tissues: Secondary | ICD-10-CM | POA: Diagnosis present

## 2016-01-14 DIAGNOSIS — Z23 Encounter for immunization: Secondary | ICD-10-CM

## 2016-01-14 LAB — CBC WITH DIFFERENTIAL/PLATELET
BASOS PCT: 2 %
Basophils Absolute: 0.1 10*3/uL (ref 0–0.1)
EOS ABS: 0.1 10*3/uL (ref 0–0.7)
EOS PCT: 2 %
HCT: 35.9 % (ref 35.0–47.0)
Hemoglobin: 12.2 g/dL (ref 12.0–16.0)
LYMPHS ABS: 2.7 10*3/uL (ref 1.0–3.6)
Lymphocytes Relative: 32 %
MCH: 30.6 pg (ref 26.0–34.0)
MCHC: 33.9 g/dL (ref 32.0–36.0)
MCV: 90.2 fL (ref 80.0–100.0)
MONO ABS: 0.7 10*3/uL (ref 0.2–0.9)
MONOS PCT: 9 %
Neutro Abs: 4.8 10*3/uL (ref 1.4–6.5)
Neutrophils Relative %: 55 %
Platelets: 207 10*3/uL (ref 150–440)
RBC: 3.98 MIL/uL (ref 3.80–5.20)
RDW: 13.9 % (ref 11.5–14.5)
WBC: 8.5 10*3/uL (ref 3.6–11.0)

## 2016-01-14 LAB — COMPREHENSIVE METABOLIC PANEL
ALBUMIN: 4.1 g/dL (ref 3.5–5.0)
ALT: 15 U/L (ref 14–54)
ANION GAP: 9 (ref 5–15)
AST: 19 U/L (ref 15–41)
Alkaline Phosphatase: 143 U/L — ABNORMAL HIGH (ref 38–126)
BUN: 19 mg/dL (ref 6–20)
CHLORIDE: 103 mmol/L (ref 101–111)
CO2: 24 mmol/L (ref 22–32)
Calcium: 9 mg/dL (ref 8.9–10.3)
Creatinine, Ser: 1.14 mg/dL — ABNORMAL HIGH (ref 0.44–1.00)
GFR calc non Af Amer: 48 mL/min — ABNORMAL LOW (ref >60)
GFR, EST AFRICAN AMERICAN: 55 mL/min — AB (ref >60)
GLUCOSE: 101 mg/dL — AB (ref 65–99)
Potassium: 3.7 mmol/L (ref 3.5–5.1)
SODIUM: 136 mmol/L (ref 135–145)
Total Bilirubin: 0.5 mg/dL (ref 0.3–1.2)
Total Protein: 7.4 g/dL (ref 6.5–8.1)

## 2016-01-14 LAB — LACTATE DEHYDROGENASE: LDH: 146 U/L (ref 98–192)

## 2016-01-14 MED ORDER — INFLUENZA VAC SPLIT QUAD 0.5 ML IM SUSY
0.5000 mL | PREFILLED_SYRINGE | Freq: Once | INTRAMUSCULAR | Status: AC
  Administered 2016-01-14: 0.5 mL via INTRAMUSCULAR

## 2016-01-14 NOTE — Progress Notes (Signed)
Patient ambulates without assistance, brought to exam room 3.  Patient denies pain or discomfort at this time. Vitals stable and documented

## 2016-02-01 ENCOUNTER — Other Ambulatory Visit (INDEPENDENT_AMBULATORY_CARE_PROVIDER_SITE_OTHER): Payer: Self-pay | Admitting: Vascular Surgery

## 2016-02-01 ENCOUNTER — Encounter (INDEPENDENT_AMBULATORY_CARE_PROVIDER_SITE_OTHER): Payer: Medicare Other

## 2016-02-01 ENCOUNTER — Ambulatory Visit (INDEPENDENT_AMBULATORY_CARE_PROVIDER_SITE_OTHER): Payer: Medicare Other | Admitting: Vascular Surgery

## 2016-02-01 ENCOUNTER — Encounter (INDEPENDENT_AMBULATORY_CARE_PROVIDER_SITE_OTHER): Payer: Self-pay

## 2016-02-01 ENCOUNTER — Encounter (INDEPENDENT_AMBULATORY_CARE_PROVIDER_SITE_OTHER): Payer: Self-pay | Admitting: Vascular Surgery

## 2016-02-01 VITALS — BP 146/86 | HR 65 | Resp 16 | Ht 63.0 in | Wt 159.0 lb

## 2016-02-01 DIAGNOSIS — I6523 Occlusion and stenosis of bilateral carotid arteries: Secondary | ICD-10-CM

## 2016-02-01 DIAGNOSIS — I70219 Atherosclerosis of native arteries of extremities with intermittent claudication, unspecified extremity: Secondary | ICD-10-CM | POA: Diagnosis not present

## 2016-02-02 NOTE — Progress Notes (Signed)
Patient left without being seen.

## 2016-05-01 ENCOUNTER — Ambulatory Visit (INDEPENDENT_AMBULATORY_CARE_PROVIDER_SITE_OTHER): Payer: Medicare Other | Admitting: Family Medicine

## 2016-05-01 ENCOUNTER — Encounter: Payer: Self-pay | Admitting: Family Medicine

## 2016-05-01 VITALS — BP 140/70 | HR 79 | Temp 97.9°F | Resp 16 | Ht 63.0 in | Wt 158.6 lb

## 2016-05-01 DIAGNOSIS — I739 Peripheral vascular disease, unspecified: Secondary | ICD-10-CM

## 2016-05-01 DIAGNOSIS — R0989 Other specified symptoms and signs involving the circulatory and respiratory systems: Secondary | ICD-10-CM | POA: Diagnosis not present

## 2016-05-01 DIAGNOSIS — C8330 Diffuse large B-cell lymphoma, unspecified site: Secondary | ICD-10-CM | POA: Diagnosis not present

## 2016-05-01 DIAGNOSIS — I10 Essential (primary) hypertension: Secondary | ICD-10-CM

## 2016-05-01 DIAGNOSIS — R600 Localized edema: Secondary | ICD-10-CM

## 2016-05-01 MED ORDER — LOSARTAN POTASSIUM 25 MG PO TABS: ORAL_TABLET | ORAL | 3 refills | Status: DC

## 2016-05-01 MED ORDER — AMLODIPINE BESYLATE 5 MG PO TABS: ORAL_TABLET | ORAL | 12 refills | Status: DC

## 2016-05-01 NOTE — Progress Notes (Signed)
Name: Ashley Lynch   MRN: [id number]    DOB: 04-Oct-1945   Date:05/01/2016       Progress Note  Subjective  Chief Complaint  Chief Complaint  Patient presents with  . 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  She still takes rare dose of Meloxicam.  She still smokes 1 ppd.  She has had vascular w/u y Dr. Lucky Cowboy with mild to moderate art narrowing in leg art. No problem-specific Assessment & Plan notes found for this encounter.   Past Medical History:  Diagnosis Date  . Cancer ([place])    lymphoma  . History of chicken pox   . History of measles, mumps, or rubella     Past Surgical History:  Procedure Laterality Date  . ABDOMINAL HYSTERECTOMY  2013  . [place]  . TONSILLECTOMY  2010    Family History  Problem Relation Age of Onset  . Diabetes Mother     sibling and grandparent also    Social History   Social History  . Marital status: Widowed    Spouse name: N/A  . Number of children: N/A  . Years of education: N/A   Occupational History  . Not on file.   Social History Main Topics  . Smoking status: Current Every Day Smoker    Packs/day: 1.00    Years: 52.00    Types: Cigarettes  . Smokeless tobacco: Current User  . Alcohol use No  . Drug use: No  . Sexual activity: Not on file   Other Topics Concern  . Not on file   Social History Narrative  . No narrative on file     Current Outpatient Prescriptions:  .  amLODipine (NORVASC) 5 MG tablet, Take 1/2 tablet each morning, Disp: 30 tablet, Rfl: 12 .  aspirin 81 MG tablet, Take 81 mg by mouth daily., Disp: , Rfl:  .  diphenhydrAMINE (BENADRYL) 25 mg capsule, Take 25 mg by mouth every 6 (six) hours as needed., Disp: , Rfl:  .  fluticasone (FLONASE) 50 MCG/ACT nasal spray, instill 2 sprays into each nostril once daily (Patient taking differently: instill 2 sprays into each nostril as needed), Disp: 48 g, Rfl: 3 .  Homeopathic Products (LEG CRAMP RELIEF PO),  Take by mouth., Disp: , Rfl:  .  losartan (COZAAR) 25 MG tablet, Take 1 tablet daily., Disp: 90 tablet, Rfl: 3 .  meclizine (ANTIVERT) 25 MG tablet, Take 25 mg by mouth daily as needed for dizziness. Reported on 05/27/2015, Disp: , Rfl:  .  meloxicam (MOBIC) 15 MG tablet, Take 1 tablet (15 mg total) by mouth daily. (Patient taking differently: Take 15 mg by mouth as needed. ), Disp: 30 tablet, Rfl: 6 .  omeprazole (PRILOSEC) 20 MG capsule, Take 1 capsule (20 mg total) by mouth daily., Disp: 90 capsule, Rfl: 3  No Known Allergies   Review of Systems  Constitutional: Negative for chills, diaphoresis, fever, malaise/fatigue and weight loss.  HENT: Negative for hearing loss and tinnitus.   Eyes: Negative for blurred vision and double vision.  Respiratory: Negative for cough, sputum production, shortness of breath and wheezing.   Cardiovascular: Positive for chest pain (occ.) and leg swelling. Negative for palpitations.       Feels R sided chest pain about 1 x / wk.  Lasts < 1 min and resolves  spontaneously.  Occurs  Without reason.  Gastrointestinal: Negative for abdominal pain, blood in stool and  heartburn.  Genitourinary: Negative for dysuria, frequency and urgency.  Musculoskeletal: Negative for joint pain and myalgias.  Skin: Negative for rash.  Neurological: Negative for dizziness, tingling, tremors and headaches.      Objective  Vitals:   05/01/16 1318 05/01/16 1353  BP: (!) 147/77 140/70  Pulse: 79   Resp: 16   Temp: 97.9 F (36.6 C)   TempSrc: Oral   Weight: 158 lb 9.6 oz (71.9 kg)   Height: 5\' 3"  (1.6 m)     Physical Exam  Constitutional: She is oriented to person, place, and time and well-developed, well-nourished, and in no distress. No distress.  HENT:  Head: Normocephalic and atraumatic.  Eyes: Conjunctivae are normal. Pupils are equal, round, and reactive to light. No scleral icterus.  Neck: Normal range of motion. Neck supple. Carotid bruit: mild soft R carotid  buit. No thyromegaly present.  Cardiovascular: Normal rate and regular rhythm.   Murmur heard.  Systolic murmur is present with a grade of 2/6  URSB  Pulmonary/Chest: Effort normal and breath sounds normal. No respiratory distress. She has no wheezes. She has no rales.  Abdominal: Soft. Bowel sounds are normal. She exhibits no distension and no mass. There is no tenderness.  Musculoskeletal: She exhibits edema (Traced bilateral pedal edema, slightly more on R than L.).  Lymphadenopathy:    She has no cervical adenopathy.  Neurological: She is alert and oriented to person, place, and time.  Vitals reviewed.      No results found for this or any previous visit (from the past 2160 hour(s)).   Assessment & Plan  Problem List Items Addressed This Visit      Cardiovascular and Mediastinum   Essential hypertension - Primary   Relevant Medications   amLODipine (NORVASC) 5 MG tablet   losartan (COZAAR) 25 MG tablet   PVD (peripheral vascular disease) (HCC)   Relevant Medications   amLODipine (NORVASC) 5 MG tablet   losartan (COZAAR) 25 MG tablet     Other   DLBCL (diffuse large B cell lymphoma) (HCC)   Right carotid bruit   Pedal edema      Meds ordered this encounter  Medications  . amLODipine (NORVASC) 5 MG tablet    Sig: Take 1/2 tablet each morning    Dispense:  30 tablet    Refill:  12  . losartan (COZAAR) 25 MG tablet    Sig: Take 1 tablet daily.    Dispense:  90 tablet    Refill:  3   1. Essential hypertension  - amLODipine (NORVASC) 5 MG tablet; Take 1/2 tablet each morning  Dispense: 30 tablet; Refill: 12 - losartan (COZAAR) 25 MG tablet; Take 1 tablet daily.  Dispense: 90 tablet; Refill: 3  2. Diffuse large B-cell lymphoma, unspecified body region ([place])   3. PVD (peripheral vascular disease) ([place]) Make f/u appt with Dr. Lucky Cowboy.  4. Right carotid bruit   5. Pedal edema

## 2016-06-19 ENCOUNTER — Encounter: Payer: Self-pay | Admitting: Family Medicine

## 2016-06-19 ENCOUNTER — Ambulatory Visit (INDEPENDENT_AMBULATORY_CARE_PROVIDER_SITE_OTHER): Payer: Medicare Other | Admitting: Family Medicine

## 2016-06-19 VITALS — BP 121/68 | HR 80 | Temp 98.2°F | Resp 16 | Ht 63.0 in | Wt 158.0 lb

## 2016-06-19 DIAGNOSIS — G5790 Unspecified mononeuropathy of unspecified lower limb: Secondary | ICD-10-CM | POA: Diagnosis not present

## 2016-06-19 DIAGNOSIS — C8330 Diffuse large B-cell lymphoma, unspecified site: Secondary | ICD-10-CM

## 2016-06-19 DIAGNOSIS — M792 Neuralgia and neuritis, unspecified: Secondary | ICD-10-CM

## 2016-06-19 DIAGNOSIS — I10 Essential (primary) hypertension: Secondary | ICD-10-CM | POA: Diagnosis not present

## 2016-06-19 DIAGNOSIS — I739 Peripheral vascular disease, unspecified: Secondary | ICD-10-CM

## 2016-06-19 DIAGNOSIS — R0989 Other specified symptoms and signs involving the circulatory and respiratory systems: Secondary | ICD-10-CM

## 2016-06-19 MED ORDER — LOSARTAN POTASSIUM 25 MG PO TABS: ORAL_TABLET | ORAL | 3 refills | Status: DC

## 2016-06-19 MED ORDER — AMLODIPINE BESYLATE 5 MG PO TABS: ORAL_TABLET | ORAL | 12 refills | Status: DC

## 2016-06-19 NOTE — Progress Notes (Signed)
Name: Ashley Lynch   MRN: [id number]    DOB: [birth year]   Date:06/19/2016       Progress Note  Subjective  Chief Complaint  Chief Complaint  Patient presents with  . Hypertension    HPI Here for f/u of HBP.  She is taking her meds.  Had N/V and D yesterday, but resolved over 6-8 hrs.  She is slowly regaining her appetite without problems.  No problem-specific Assessment & Plan notes found for this encounter.   Past Medical History:  Diagnosis Date  . Cancer ([place])    lymphoma  . History of chicken pox   . History of measles, mumps, or rubella     Past Surgical History:  Procedure Laterality Date  . ABDOMINAL HYSTERECTOMY  2013  . [place]  . TONSILLECTOMY  2010    Family History  Problem Relation Age of Onset  . Diabetes Mother     sibling and grandparent also    Social History   Social History  . Marital status: Widowed    Spouse name: N/A  . Number of children: N/A  . Years of education: N/A   Occupational History  . Not on file.   Social History Main Topics  . Smoking status: Current Every Day Smoker    Packs/day: 1.00    Years: 52.00    Types: Cigarettes  . Smokeless tobacco: Current User  . Alcohol use No  . Drug use: No  . Sexual activity: Not on file   Other Topics Concern  . Not on file   Social History Narrative  . No narrative on file     Current Outpatient Prescriptions:  .  amLODipine (NORVASC) 5 MG tablet, Take 1/2 tablet each morning (Patient taking differently: 5 mg. Take 1/2 tablet each morning.  Patient taking 1 tablet daily for past week.), Disp: 30 tablet, Rfl: 12 .  aspirin 81 MG tablet, Take 81 mg by mouth daily., Disp: , Rfl:  .  diphenhydrAMINE (BENADRYL) 25 mg capsule, Take 25 mg by mouth every 6 (six) hours as needed., Disp: , Rfl:  .  fluticasone (FLONASE) 50 MCG/ACT nasal spray, instill 2 sprays into each nostril once daily (Patient taking differently: instill 2 sprays into each nostril as needed),  Disp: 48 g, Rfl: 3 .  Homeopathic Products (LEG CRAMP RELIEF PO), Take by mouth., Disp: , Rfl:  .  losartan (COZAAR) 25 MG tablet, Take 1 tablet daily., Disp: 90 tablet, Rfl: 3 .  meclizine (ANTIVERT) 25 MG tablet, Take 25 mg by mouth daily as needed for dizziness. Reported on 05/27/2015, Disp: , Rfl:  .  omeprazole (PRILOSEC) 20 MG capsule, Take 1 capsule (20 mg total) by mouth daily., Disp: 90 capsule, Rfl: 3  Not on File   Review of Systems  Constitutional: Negative for chills, fever, malaise/fatigue and weight loss.  HENT: Negative for hearing loss and tinnitus.   Eyes: Negative for blurred vision and double vision.  Respiratory: Negative for cough, shortness of breath and wheezing.   Cardiovascular: Negative for chest pain, palpitations and leg swelling.  Gastrointestinal: Negative for abdominal pain, blood in stool and heartburn.  Genitourinary: Negative for dysuria, frequency and urgency.  Musculoskeletal: Negative for joint pain and myalgias.  Skin: Negative for rash.  Neurological: Positive for weakness (mmild from GI illness yesterday.). Negative for dizziness, tingling, tremors and headaches.      Objective  Vitals:   06/19/16 1335  BP: 121/68  Pulse: 80  Resp:  16  Temp: 98.2 F (36.8 C)  TempSrc: Oral  Weight: 158 lb (71.7 kg)  Height: 5\' 3"  (1.6 m)    Physical Exam  Constitutional: She is oriented to person, place, and time. No distress.  HENT:  Head: Normocephalic and atraumatic.  Eyes: Conjunctivae and EOM are normal. Pupils are equal, round, and reactive to light. No scleral icterus.  Neck: Normal range of motion. Neck supple. Carotid bruit is present (R carotid > L carotid). No thyromegaly present.  Cardiovascular: Normal rate, regular rhythm and normal heart sounds.  Exam reveals no gallop and no friction rub.   No murmur heard. Pulmonary/Chest: Effort normal and breath sounds normal. No respiratory distress. She has no wheezes. She has no rales.   Abdominal: Soft. Bowel sounds are normal. She exhibits no distension and no mass. There is no tenderness.  Musculoskeletal: She exhibits no edema.  Lymphadenopathy:    She has no cervical adenopathy.  Neurological: She is alert and oriented to person, place, and time.  Vitals reviewed.      No results found for this or any previous visit (from the past 2160 hour(s)).   Assessment & Plan  Problem List Items Addressed This Visit      Cardiovascular and Mediastinum   Essential hypertension - Primary   Relevant Orders   COMPLETE METABOLIC PANEL WITH GFR   PVD (peripheral vascular disease) (HCC)   Relevant Orders   Lipid Profile     Other   Neuropathic pain, leg   Relevant Orders   CBC with Differential   DLBCL (diffuse large B cell lymphoma) (HCC)   Right carotid bruit      No orders of the defined types were placed in this encounter.  1. Essential hypertension contg Amlodipine and Losartanm - COMPLETE METABOLIC PANEL WITH GFR  2. PVD (peripheral vascular disease) (HCC)  - Lipid Profile  3. Diffuse large B-cell lymphoma, unspecified body region ([place])   4. Neuropathic pain of lower extremity, unspecified laterality  - CBC with Differential  5. Right carotid bruit

## 2016-06-21 ENCOUNTER — Other Ambulatory Visit: Payer: Medicare Other

## 2016-06-21 LAB — CBC WITH DIFFERENTIAL/PLATELET
Basophils Absolute: 84 cells/uL (ref 0–200)
Basophils Relative: 1 %
EOS PCT: 3 %
Eosinophils Absolute: 252 cells/uL (ref 15–500)
HEMATOCRIT: 38.1 % (ref 35.0–45.0)
Hemoglobin: 12.2 g/dL (ref 11.7–15.5)
LYMPHS PCT: 40 %
Lymphs Abs: 3360 cells/uL (ref 850–3900)
MCH: 30 pg (ref 27.0–33.0)
MCHC: 32 g/dL (ref 32.0–36.0)
MCV: 93.6 fL (ref 80.0–100.0)
MONO ABS: 756 {cells}/uL (ref 200–950)
MPV: 10.9 fL (ref 7.5–12.5)
Monocytes Relative: 9 %
NEUTROS PCT: 47 %
Neutro Abs: 3948 cells/uL (ref 1500–7800)
Platelets: 229 10*3/uL (ref 140–400)
RBC: 4.07 MIL/uL (ref 3.80–5.10)
RDW: 14.6 % (ref 11.0–15.0)
WBC: 8.4 10*3/uL (ref 3.8–10.8)

## 2016-06-21 LAB — COMPLETE METABOLIC PANEL WITH GFR
ALBUMIN: 4 g/dL (ref 3.6–5.1)
ALK PHOS: 129 U/L (ref 33–130)
ALT: 13 U/L (ref 6–29)
AST: 16 U/L (ref 10–35)
BUN: 23 mg/dL (ref 7–25)
CALCIUM: 9.2 mg/dL (ref 8.6–10.4)
CO2: 24 mmol/L (ref 20–31)
CREATININE: 1.07 mg/dL — AB (ref 0.60–0.93)
Chloride: 108 mmol/L (ref 98–110)
GFR, Est African American: 61 mL/min (ref >=60)
GFR, Est Non African American: 53 mL/min — ABNORMAL LOW (ref >=60)
Glucose, Bld: 94 mg/dL (ref 65–99)
Potassium: 4.1 mmol/L (ref 3.5–5.3)
Sodium: 140 mmol/L (ref 135–146)
Total Bilirubin: 0.3 mg/dL (ref 0.2–1.2)
Total Protein: 7.1 g/dL (ref 6.1–8.1)

## 2016-06-21 LAB — LIPID PANEL
Cholesterol: 190 mg/dL (ref <200)
HDL: 35 mg/dL — ABNORMAL LOW (ref >50)
LDL CALC: 127 mg/dL — AB (ref <100)
TRIGLYCERIDES: 141 mg/dL (ref <150)
Total CHOL/HDL Ratio: 5.4 Ratio — ABNORMAL HIGH (ref <5.0)
VLDL: 28 mg/dL (ref <30)

## 2016-08-17 ENCOUNTER — Other Ambulatory Visit: Payer: Self-pay | Admitting: Neurosurgery

## 2016-08-17 DIAGNOSIS — M48062 Spinal stenosis, lumbar region with neurogenic claudication: Secondary | ICD-10-CM

## 2016-08-24 ENCOUNTER — Ambulatory Visit
Admission: RE | Admit: 2016-08-24 | Discharge: 2016-08-24 | Disposition: A | Discharge status: Home / Self Care | Payer: Medicare Other | Source: Ambulatory Visit | Attending: Neurosurgery | Admitting: Neurosurgery

## 2016-08-24 DIAGNOSIS — M48062 Spinal stenosis, lumbar region with neurogenic claudication: Secondary | ICD-10-CM | POA: Diagnosis not present

## 2016-08-24 DIAGNOSIS — M5127 Other intervertebral disc displacement, lumbosacral region: Secondary | ICD-10-CM | POA: Diagnosis not present

## 2016-08-24 DIAGNOSIS — C8590 Non-Hodgkin lymphoma, unspecified, unspecified site: Secondary | ICD-10-CM | POA: Diagnosis not present

## 2016-08-24 DIAGNOSIS — M5126 Other intervertebral disc displacement, lumbar region: Secondary | ICD-10-CM | POA: Diagnosis not present

## 2016-08-24 DIAGNOSIS — M47896 Other spondylosis, lumbar region: Secondary | ICD-10-CM | POA: Diagnosis not present

## 2016-09-12 ENCOUNTER — Telehealth: Payer: Self-pay | Admitting: Family Medicine

## 2016-09-12 NOTE — Telephone Encounter (Signed)
Called pt to schedule Annual Wellness Visit with Nurse Health Advisor for July:  - knb

## 2016-10-24 ENCOUNTER — Encounter: Payer: Self-pay | Admitting: Family Medicine

## 2016-10-24 ENCOUNTER — Ambulatory Visit (INDEPENDENT_AMBULATORY_CARE_PROVIDER_SITE_OTHER): Payer: Medicare Other | Admitting: Family Medicine

## 2016-10-24 ENCOUNTER — Ambulatory Visit: Payer: Medicare Other | Admitting: Family Medicine

## 2016-10-24 VITALS — BP 144/69 | HR 70 | Temp 97.9°F | Resp 16 | Ht 63.0 in | Wt 161.0 lb

## 2016-10-24 DIAGNOSIS — G8929 Other chronic pain: Secondary | ICD-10-CM | POA: Diagnosis not present

## 2016-10-24 DIAGNOSIS — G5790 Unspecified mononeuropathy of unspecified lower limb: Secondary | ICD-10-CM | POA: Diagnosis not present

## 2016-10-24 DIAGNOSIS — R600 Localized edema: Secondary | ICD-10-CM | POA: Diagnosis not present

## 2016-10-24 DIAGNOSIS — M5442 Lumbago with sciatica, left side: Secondary | ICD-10-CM

## 2016-10-24 DIAGNOSIS — C8330 Diffuse large B-cell lymphoma, unspecified site: Secondary | ICD-10-CM

## 2016-10-24 DIAGNOSIS — M792 Neuralgia and neuritis, unspecified: Secondary | ICD-10-CM

## 2016-10-24 DIAGNOSIS — Z8572 Personal history of non-Hodgkin lymphomas: Secondary | ICD-10-CM

## 2016-10-24 NOTE — Assessment & Plan Note (Signed)
Stable, secondary to prior surgery, likely some component of lymphedema Reviewed RICE therapy Caution for DVT, clinically not consistent Return criteria reviewed

## 2016-10-24 NOTE — Progress Notes (Signed)
Subjective:    Patient ID: Ashley Lynch, female    DOB: [birth year], 71 y.o.   MRN: [id number]  Ashley Lynch is a 71 y.o. 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  - Today seems to be stable without significant change, persistent back pain. Describes pain as aching and stiffness moderate severity intermittent worsening with activity. Standing limited pain, prolonged sitting or activity walking worse pain. Admits pain radiating to legs. - Limited ambulation - Taking Acetaminophen x 1 dose PM only (mild significant relief), cannot take NSAIDs - Admits difficulty sleeping due to pain, also nocturia and leg cramps - Admits general muscle cramps with lower extremities often, takes Hyland's Leg Cramp medicine homeopathic natural remedy with relief, and occasional mustard. Previously on Flexeril but did not tolerate well due to sedation, not interested in Baclofen. Also tried Gabapentin from prior PCP for neuropathy but did not tolerate well either. Takes benadryl nightly - Takes honey and cinnamon for nerve pain - Denies any fevers/chills, numbness, tingling, weakness, loss of control bladder/bowel incontinence or retention, unintentional wt loss, night sweats  Pedal [place] L>R - Reports also has some chronic L ankle swelling, following prior L leg surgery. No history of DVT but she wants to be sure doesn't develop one, tries to stay active and avoid sedentary lifestyle.  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 - Initial diagnosis reviewed with > 8 years ago, treated with tonsillectomy in 2010, then chemotherapy, has been followed by Oncology for years with monitoring and imaging update, remains free from recurrence for >5  years, now discharged from oncology - Thought some neuropathy due to chemotherapy as well, see above  Headache / Chest Pain / R Shoulder Pain - Resolved - Additional complaint of episode recently while at rest, non exertional with generalized chest pain, R shoulder and headache, took x 2 Aspirin chewed with resolution hours later, had residual muscle soreness and stiffness in shoulder. Has had before but rare occurrence, currently now without any active symptoms - No history of MI or CAD   Social History  Substance Use Topics  . Smoking status: Current Every Day Smoker    Packs/day: 1.00    Years: 52.00    Types: Cigarettes  . Smokeless tobacco: Current User  . Alcohol use No    Review of Systems Per HPI unless specifically indicated above     Objective:    BP (!) 144/69   Pulse 70   Temp 97.9 F (36.6 C) (Oral)   Resp 16   Ht 5' 3" (1.6 m)   Wt 161 lb (73 kg)   BMI 28.52 kg/m   Wt Readings from Last 3 Encounters:  10/24/16 161 lb (73 kg)  06/19/16 158 lb (71.7 kg)  05/01/16 158 lb 9.6 oz (71.9 kg)    Physical Exam  Constitutional: She is oriented to person, place, and time. She appears well-developed and well-nourished. No distress.  Well-appearing, comfortable, cooperative  HENT:  Head: Normocephalic and atraumatic.  Mouth/Throat: Oropharynx is clear and moist.  Eyes: Conjunctivae are normal. Right eye exhibits no discharge. Left eye exhibits no discharge.  Neck: Normal range of motion. Neck supple. No thyromegaly present.  Cardiovascular: Normal rate, regular rhythm, normal heart sounds and intact  distal pulses.   No murmur heard. Pulmonary/Chest: Effort normal and breath sounds normal. No respiratory distress. She has no wheezes. She has no rales.  Musculoskeletal: Normal range of motion. She exhibits no edema (Mild trace LLE ankle/pedal edema, minimal today).  Lymphadenopathy:    She has no cervical adenopathy.  Neurological: She is alert and oriented to  person, place, and time.  Skin: Skin is warm and dry. No rash noted. She is not diaphoretic. No erythema.  Psychiatric: She has a normal mood and affect. Her behavior is normal.  Well groomed, good eye contact, normal speech and thoughts  Nursing note and vitals reviewed.  I have personally reviewed the radiology report from Lumbar CT 08/24/16.  CLINICAL DATA:  Low back pain since bone marrow biopsy. Non-Hodgkin's lymphoma.  EXAM: CT LUMBAR SPINE WITHOUT CONTRAST  TECHNIQUE: Multidetector CT imaging of the lumbar spine was performed without intravenous contrast administration. Multiplanar CT image reconstructions were also generated.  COMPARISON:  None.  FINDINGS: Segmentation: Standard  Alignment:  Trace anterolisthesis L3-L4  Vertebrae: No acute fracture or focal pathologic process.  Paraspinal and other soft tissues: Aortic atherosclerosis. Transverse diameter the aorta 27 mm. Partially calcified renal cystic disease, incompletely evaluated.  Disc levels:  L1-L2:  Normal.  L2-L3:  Normal disc space.  Mild facet arthropathy.  L3-L4: Broad-based protrusion central and to the RIGHT extending into the extraforaminal compartment. Facet arthropathy. No definite impingement.  L4-L5: Central protrusion. Posterior element hypertrophy. Mild stenosis. RIGHT greater than LEFT L5 nerve root impingement is possible.  L5-S1: Central and leftward protrusion. Facet arthropathy. LEFT S1 nerve root impingement possible.  IMPRESSION: Multilevel spondylosis as described. Potentially symptomatic neural impingement due to disc pathology at L4-5 and/or L5-S1.  Ectatic abdominal aorta at risk for aneurysm development. Recommend followup by ultrasound in 5 years. This recommendation follows ACR consensus guidelines: White Paper of the ACR Incidental Findings Committee II on Vascular Findings. J Am Coll Radiol 2013; 10:789-794.   Electronically Signed   By: Staci Righter M.D.   On: 08/24/2016 15:44   Results for orders placed or performed in visit on 06/19/16  COMPLETE METABOLIC PANEL WITH GFR  Result Value Ref Range   Sodium 140 135 - 146 mmol/L   Potassium 4.1 3.5 - 5.3 mmol/L   Chloride 108 98 - 110 mmol/L   CO2 24 20 - 31 mmol/L   Glucose, Bld 94 65 - 99 mg/dL   BUN 23 7 - 25 mg/dL   Creat 1.07 (H) 0.60 - 0.93 mg/dL   Total Bilirubin 0.3 0.2 - 1.2 mg/dL   Alkaline Phosphatase 129 33 - 130 U/L   AST 16 10 - 35 U/L   ALT 13 6 - 29 U/L   Total Protein 7.1 6.1 - 8.1 g/dL   Albumin 4.0 3.6 - 5.1 g/dL   Calcium 9.2 8.6 - 10.4 mg/dL   GFR, Est African American 61 >=60 mL/min   GFR, Est Non African American 53 (L) >=60 mL/min  CBC with Differential  Result Value Ref Range   WBC 8.4 3.8 - 10.8 K/uL   RBC 4.07 3.80 - 5.10 MIL/uL   Hemoglobin 12.2 11.7 - 15.5 g/dL   HCT 38.1 35.0 - 45.0 %   MCV 93.6 80.0 - 100.0 fL   MCH 30.0 27.0 - 33.0 pg   MCHC 32.0 32.0 - 36.0 g/dL   RDW 14.6 11.0 - 15.0 %   Platelets 229 140 - 400 K/uL   MPV 10.9 7.5 - 12.5  fL   Neutro Abs 3,948 1,500 - 7,800 cells/uL   Lymphs Abs 3,360 850 - 3,900 cells/uL   Monocytes Absolute 756 200 - 950 cells/uL   Eosinophils Absolute 252 15 - 500 cells/uL   Basophils Absolute 84 0 - 200 cells/uL   Neutrophils Relative % 47 %   Lymphocytes Relative 40 %   Monocytes Relative 9 %   Eosinophils Relative 3 %   Basophils Relative 1 %   Smear Review Criteria for review not met   Lipid Profile  Result Value Ref Range   Cholesterol 190 <200 mg/dL   Triglycerides 141 <150 mg/dL   HDL 35 (L) >50 mg/dL   Total CHOL/HDL Ratio 5.4 (H) <5.0 Ratio   VLDL 28 <30 mg/dL   LDL Cholesterol 127 (H) <100 mg/dL      Assessment & Plan:   Problem List Items Addressed This Visit    Pedal edema    Stable, secondary to prior surgery, likely some component of lymphedema Reviewed RICE therapy Caution for DVT, clinically not consistent Return criteria reviewed      Neuropathic pain, leg -  Primary    Secondary to multiple causes including low back OA/DJD likely stenosis with impingement and disc disease. Also can be related to chemotherapy toxicity in past - See A&P for back pain - Consider other meds in future such as Baclofen, caution with TCA and failed Gabapentin      Diffuse large cell lymphoma in remission    No longer actively followed by [facility] Oncology Initial dx 2010, s/p tonsillectomy, chemotherapy Prior imaging and PET scans unremarkable, had bone marrow biopsy Discharged from Oncology Follow-up as needed      Chronic low back pain with left-sided sciatica    Stable chronic LBP with associated L sciatica. Suspect likely due to muscle spasm/strain. In setting of known chronic LBP with DJD with stenosis, prior surgery. - No red flag symptoms - Inadequate conservative therapy. Failed Flexeril, Gabapentin Followed by Neurosurgery Dr Trenton Gammon - Last imaging CT Lumbar 08/2016  Plan: 1. Maximize conservative therapy with Acetaminophen up to 500-1019m per dose TID PRN 2. Remain off NSAIDs 3. Offered muscle relaxant Baclofen, less sedating - declines for now 4. Encouraged use of heating pad 1-2x daily for now then PRN 5. Follow-up as needed with Neurosurgery, may benefit from guided PT         No orders of the defined types were placed in this encounter.  Follow up plan: Return in about 4 months (around 02/24/2017) for HTN, Back Pain, Nerve Pain.  ANobie Putnam [facility] 10/24/2016, 4:46 PM

## 2016-10-24 NOTE — Assessment & Plan Note (Signed)
Stable chronic LBP with associated L sciatica. Suspect likely due to muscle spasm/strain. In setting of known chronic LBP with DJD with stenosis, prior surgery. - No red flag symptoms - Inadequate conservative therapy. Failed Flexeril, Gabapentin Followed by Neurosurgery Dr Trenton Gammon - Last imaging CT Lumbar 08/2016  Plan: 1. Maximize conservative therapy with Acetaminophen up to 500-1000mg  per dose TID PRN 2. Remain off NSAIDs 3. Offered muscle relaxant Baclofen, less sedating - declines for now 4. Encouraged use of heating pad 1-2x daily for now then PRN 5. Follow-up as needed with Neurosurgery, may benefit from guided PT

## 2016-10-24 NOTE — Assessment & Plan Note (Signed)
No longer actively followed by [facility] CC Oncology Initial dx 2010, s/p tonsillectomy, chemotherapy Prior imaging and PET scans unremarkable, had bone marrow biopsy Discharged from Oncology Follow-up as needed

## 2016-10-24 NOTE — Patient Instructions (Addendum)
Thank you for coming to the clinic today.   1. For your Back Pain - I think that this is due to Muscle Spasms or strain. Your Sciatic Nerve can be affected causing some of your radiation and numbness down your legs.  2. Recommend to start taking Acetaminophen / Tylenol Extra Strength 500mg  tabs - take 1 to 2 tabs per dose (max 1000mg ) every 6-8 hours for pain (take regularly, don't skip a dose for next 7 days), max 24 hour daily dose is 6 tablets or 3000mg . In the future you can repeat the same everyday Tylenol course for 1-2 weeks at a time.   If interested in future notify office we can consider the Baclofen muscle relaxant.  Use RICE therapy: - R - Rest / relative rest with activity modification avoid overuse of joint - I - Ice packs (make sure you use a towel or sock / something to protect skin) - C - Compression with ACE wrap to apply pressure and reduce swelling allowing more support - E - Elevation - if significant swelling, lift leg above heart level (toes above your nose) to help reduce swelling, most helpful at night after day of being on your feet  This pain may take weeks to months to fully resolve, but hopefully it will respond to the medicine initially. All back injuries (small or serious) are slow to heal since we use our back muscles every day. Be careful with turning, twisting, lifting, sitting / standing for prolonged periods, and avoid re-injury.  If your symptoms significantly worsen with more pain, or new symptoms with weakness in one or both legs, new or different shooting leg pains, numbness in legs or groin, loss of control or retention of urine or bowel movements, please call back for advice and you may need to go directly to the Emergency Department.  Please schedule a Follow-up Appointment to: Return in about 4 months (around 02/24/2017) for HTN, Back Pain, Nerve Pain.  If you have any other questions or concerns, please feel free to call the clinic or send a message  through [facility]. You may also schedule an earlier appointment if necessary.  Additionally, you may be receiving a survey about your experience at our clinic within a few days to 1 week by e-mail or mail. We value your feedback.  Nobie Putnam, DO [place]

## 2016-10-24 NOTE — Assessment & Plan Note (Signed)
Secondary to multiple causes including low back OA/DJD likely stenosis with impingement and disc disease. Also can be related to chemotherapy toxicity in past - See A&P for back pain - Consider other meds in future such as Baclofen, caution with TCA and failed Gabapentin

## 2016-11-23 ENCOUNTER — Other Ambulatory Visit: Payer: Self-pay

## 2016-11-23 DIAGNOSIS — K219 Gastro-esophageal reflux disease without esophagitis: Secondary | ICD-10-CM

## 2016-11-23 MED ORDER — OMEPRAZOLE 20 MG PO CPDR: 20.0000 mg | DELAYED_RELEASE_CAPSULE | Freq: Every day | ORAL | 3 refills | Status: DC

## 2016-11-27 ENCOUNTER — Other Ambulatory Visit: Payer: Self-pay

## 2016-11-27 DIAGNOSIS — K219 Gastro-esophageal reflux disease without esophagitis: Secondary | ICD-10-CM

## 2016-11-27 MED ORDER — OMEPRAZOLE 20 MG PO CPDR: 20.0000 mg | DELAYED_RELEASE_CAPSULE | Freq: Every day | ORAL | 3 refills | Status: DC

## 2016-11-29 ENCOUNTER — Other Ambulatory Visit: Payer: Self-pay

## 2017-01-22 ENCOUNTER — Other Ambulatory Visit: Payer: Self-pay | Admitting: Internal Medicine

## 2017-01-22 DIAGNOSIS — Z1231 Encounter for screening mammogram for malignant neoplasm of breast: Secondary | ICD-10-CM

## 2017-02-27 ENCOUNTER — Ambulatory Visit: Payer: Medicare Other | Admitting: Family Medicine

## 2017-03-14 ENCOUNTER — Emergency Department
Admission: EM | Admit: 2017-03-14 | Discharge: 2017-03-14 | Disposition: A | Discharge status: Home / Self Care | Payer: Medicare Other | Attending: Emergency Medicine | Admitting: Emergency Medicine

## 2017-03-14 ENCOUNTER — Emergency Department: Payer: Medicare Other

## 2017-03-14 ENCOUNTER — Encounter: Payer: Self-pay | Admitting: Emergency Medicine

## 2017-03-14 DIAGNOSIS — Z79899 Other long term (current) drug therapy: Secondary | ICD-10-CM | POA: Diagnosis not present

## 2017-03-14 DIAGNOSIS — G8929 Other chronic pain: Secondary | ICD-10-CM | POA: Diagnosis not present

## 2017-03-14 DIAGNOSIS — R1031 Right lower quadrant pain: Secondary | ICD-10-CM | POA: Insufficient documentation

## 2017-03-14 DIAGNOSIS — I10 Essential (primary) hypertension: Secondary | ICD-10-CM | POA: Insufficient documentation

## 2017-03-14 DIAGNOSIS — F17220 Nicotine dependence, chewing tobacco, uncomplicated: Secondary | ICD-10-CM | POA: Insufficient documentation

## 2017-03-14 DIAGNOSIS — F17210 Nicotine dependence, cigarettes, uncomplicated: Secondary | ICD-10-CM | POA: Insufficient documentation

## 2017-03-14 DIAGNOSIS — R109 Unspecified abdominal pain: Secondary | ICD-10-CM

## 2017-03-14 DIAGNOSIS — N390 Urinary tract infection, site not specified: Secondary | ICD-10-CM | POA: Diagnosis not present

## 2017-03-14 LAB — BASIC METABOLIC PANEL
ANION GAP: 13 (ref 5–15)
BUN: 17 mg/dL (ref 6–20)
CALCIUM: 9 mg/dL (ref 8.9–10.3)
CO2: 21 mmol/L — ABNORMAL LOW (ref 22–32)
Chloride: 100 mmol/L — ABNORMAL LOW (ref 101–111)
Creatinine, Ser: 0.94 mg/dL (ref 0.44–1.00)
GFR calc Af Amer: >60 mL/min (ref >60)
GFR, EST NON AFRICAN AMERICAN: 60 mL/min — AB (ref >60)
GLUCOSE: 175 mg/dL — AB (ref 65–99)
Potassium: 3.6 mmol/L (ref 3.5–5.1)
Sodium: 134 mmol/L — ABNORMAL LOW (ref 135–145)

## 2017-03-14 LAB — URINALYSIS, COMPLETE (UACMP) WITH MICROSCOPIC
BACTERIA UA: NONE SEEN
Bilirubin Urine: NEGATIVE
GLUCOSE, UA: NEGATIVE mg/dL
KETONES UR: NEGATIVE mg/dL
Leukocytes, UA: NEGATIVE
Nitrite: NEGATIVE
PROTEIN: NEGATIVE mg/dL
Specific Gravity, Urine: 1.02 (ref 1.005–1.030)
pH: 5 (ref 5.0–8.0)

## 2017-03-14 LAB — CBC
HCT: 36.7 % (ref 35.0–47.0)
HEMOGLOBIN: 12.6 g/dL (ref 12.0–16.0)
MCH: 31.8 pg (ref 26.0–34.0)
MCHC: 34.3 g/dL (ref 32.0–36.0)
MCV: 92.6 fL (ref 80.0–100.0)
PLATELETS: 223 10*3/uL (ref 150–440)
RBC: 3.96 MIL/uL (ref 3.80–5.20)
RDW: 13.9 % (ref 11.5–14.5)
WBC: 8.4 10*3/uL (ref 3.6–11.0)

## 2017-03-14 MED ORDER — CEFTRIAXONE SODIUM IN DEXTROSE 20 MG/ML IV SOLN
1.0000 g | Freq: Once | INTRAVENOUS | Status: AC
  Administered 2017-03-14: 1 g via INTRAVENOUS
  Filled 2017-03-14: qty 50

## 2017-03-14 MED ORDER — IOPAMIDOL (ISOVUE-300) INJECTION 61%
100.0000 mL | Freq: Once | INTRAVENOUS | Status: AC | PRN
  Administered 2017-03-14: 100 mL via INTRAVENOUS

## 2017-03-14 MED ORDER — CEPHALEXIN 500 MG PO CAPS: 500.0000 mg | ORAL_CAPSULE | Freq: Three times a day (TID) | ORAL | 0 refills | Status: DC

## 2017-03-14 MED ORDER — FENTANYL CITRATE (PF) 100 MCG/2ML IJ SOLN
50.0000 ug | INTRAMUSCULAR | Status: DC | PRN
  Administered 2017-03-14: 50 ug via INTRAVENOUS

## 2017-03-14 MED ORDER — PHENAZOPYRIDINE HCL 200 MG PO TABS
200.0000 mg | ORAL_TABLET | Freq: Once | ORAL | Status: AC
  Administered 2017-03-14: 200 mg via ORAL
  Filled 2017-03-14: qty 1

## 2017-03-14 MED ORDER — IOPAMIDOL (ISOVUE-300) INJECTION 61%
30.0000 mL | Freq: Once | INTRAVENOUS | Status: AC | PRN
  Administered 2017-03-14: 30 mL via ORAL

## 2017-03-14 MED ORDER — PHENAZOPYRIDINE HCL 200 MG PO TABS: 200.0000 mg | ORAL_TABLET | Freq: Three times a day (TID) | ORAL | 0 refills | Status: AC | PRN

## 2017-03-14 MED ORDER — FENTANYL CITRATE (PF) 100 MCG/2ML IJ SOLN
INTRAMUSCULAR | Status: AC
  Administered 2017-03-14: 50 ug via INTRAVENOUS
  Filled 2017-03-14: qty 2

## 2017-03-14 NOTE — ED Provider Notes (Signed)
[facility] Emergency Department Provider Note  ____________________________________________   I have reviewed the triage vital signs and the nursing notes.   HISTORY  Chief Complaint Flank Pain   History limited by: Not Limited   HPI Ashley Lynch is a 71 y.o. female who presents to the emergency department today because of flank pain.   LOCATION:right flank DURATION:started this morning TIMING: constant SEVERITY: severe QUALITY: ache CONTEXT: patient denies any trauma. Denies similar pain in the past. No history of kidney stones. MODIFYING FACTORS: tried taking tylenol without any relief ASSOCIATED SYMPTOMS: denies any fevers. Denies any change with urination. Did have diarrhea.  Per medical record review patient has a history of cholecystectomy, hysterectomy.  Past Medical History:  Diagnosis Date  . Cancer ([place])    lymphoma  . History of chicken pox   . History of measles, mumps, or rubella     Patient Active Problem List   Diagnosis Date Noted  . Chronic low back pain with left-sided sciatica 10/24/2016  . PVD (peripheral vascular disease) ([place]) 05/01/2016  . Right carotid bruit 05/01/2016  . Pedal edema 05/01/2016  . 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[place]) 01/12/2016  . Chest pain 11/01/2015  . Health maintenance examination 05/27/2015  . Depression 05/27/2015  . Colon cancer screening 05/27/2015  . Decreased hearing of left ear 05/27/2015  . Left leg swelling 05/27/2015  . Neuropathic pain, leg 01/01/2015  . Left leg pain 01/01/2015  . Essential hypertension 12/23/2014  . Heartburn 11/18/2014  . Hypertension 11/18/2014  . Urinary frequency 11/18/2014  . Smoking 11/18/2014  . History of lymphoma 08/17/2014    Past Surgical History:  Procedure Laterality Date  . ABDOMINAL HYSTERECTOMY  2013  . [place]  . TONSILLECTOMY  2010    Prior to Admission medications   Medication Sig Start Date End  Date Taking? Authorizing Provider  amLODipine (NORVASC) 5 MG tablet Take 1 tablet each morning 06/19/16   Arlis Porta., MD  aspirin 81 MG tablet Take 81 mg by mouth daily.    [provider]  diphenhydrAMINE (BENADRYL) 25 mg capsule Take 25 mg by mouth every 6 (six) hours as needed.    [provider]  fluticasone Asencion Islam) 50 MCG/ACT nasal spray instill 2 sprays into each nostril once daily Patient taking differently: instill 2 sprays into each nostril as needed 01/11/16   Arlis Porta., MD  Homeopathic Products (LEG CRAMP RELIEF PO) Take by mouth.    [provider]  losartan (COZAAR) 25 MG tablet Take 1 tablet daily. 06/19/16   Arlis Porta., MD  meclizine (ANTIVERT) 25 MG tablet Take 25 mg by mouth daily as needed for dizziness. Reported on 05/27/2015    [provider]  omeprazole (PRILOSEC) 20 MG capsule Take 1 capsule (20 mg total) by mouth daily. 11/27/16   Olin Hauser, DO    Allergies Patient has no known allergies.  Family History  Problem Relation Age of Onset  . Diabetes Mother        sibling and grandparent also    Social History Social History   Tobacco Use  . Smoking status: Current Every Day Smoker    Packs/day: 1.00    Years: 52.00    Pack years: 52.00    Types: Cigarettes  . Smokeless tobacco: Current User  Substance Use Topics  . Alcohol use: No  . Drug use: No    Review of Systems Constitutional:  No fever/chills Eyes: No visual changes. ENT: No sore throat. Cardiovascular: Denies chest pain. Respiratory: Denies shortness of breath. Gastrointestinal: Positive for right flank pain. Genitourinary: Negative for dysuria. Musculoskeletal: Negative for back pain. Skin: Negative for rash. Neurological: Negative for headaches, focal weakness or numbness.  ____________________________________________   PHYSICAL EXAM:  VITAL SIGNS: ED Triage Vitals [03/14/17 1657]  Enc Vitals Group     BP  (!) 180/81     Pulse Rate 97     Resp 18     Temp (!) 97.5 F (36.4 C)     Temp Source Oral     SpO2 97 %     Weight 155 lb (70.3 kg)     Height 5\' 3"  (1.6 m)     Head Circumference      Peak Flow      Pain Score 10    Constitutional: Alert and oriented. Well appearing and in no distress. Eyes: Conjunctivae are normal.  ENT   Head: Normocephalic and atraumatic.   Nose: No congestion/rhinnorhea.   Mouth/Throat: Mucous membranes are moist.   Neck: No stridor. Hematological/Lymphatic/Immunilogical: No cervical lymphadenopathy. Cardiovascular: Normal rate, regular rhythm.  No murmurs, rubs, or gallops. Respiratory: Normal respiratory effort without tachypnea nor retractions. Breath sounds are clear and equal bilaterally. No wheezes/rales/rhonchi. Gastrointestinal: Soft and non tender. No rebound. No guarding.  Genitourinary: Deferred Musculoskeletal: Normal range of motion in all extremities. No lower extremity edema. Neurologic:  Normal speech and language. No gross focal neurologic deficits are appreciated.  Skin:  Skin is warm, dry and intact. No rash noted. Psychiatric: Mood and affect are normal. Speech and behavior are normal. Patient exhibits appropriate insight and judgment.  ____________________________________________    LABS (pertinent positives/negatives)  UA urine dipstick small, WBC 6-30 BMP na 134, glu 175, cr 0.94 CBC wnl ____________________________________________   EKG  I, Nance Pear, attending physician, personally viewed and interpreted this EKG  EKG Time: 1713 Rate: 69 Rhythm: normal sinus rhythm Axis: normal Intervals: qtc 428 QRS: q waves v1, v2 ST changes: no st elevation Impression: abnormal ekg   ____________________________________________    RADIOLOGY  CT abd/pel No obvious etiology of the patients  pain.  ____________________________________________   PROCEDURES  Procedures  ____________________________________________   INITIAL IMPRESSION / ASSESSMENT AND PLAN / ED COURSE  Pertinent labs & imaging results that were available during my care of the patient were reviewed by me and considered in my medical decision making (see chart for details).  Patient presented to the emergency department today with right flank pain. Differential includes kidney stones, kidney infection, appendicitis, colitis, shingles most other etiologies. Patient did have some blood and white blood cells in the urine. CT scan was performed which did not show any kidney stones or other obvious etiology of the patient's discomfort. At this point I do think infection most likely. Will give dose of antibiotics here in the emergency department. Will plan on discharging with further antibiotics. Discussed findings and plan with patient and family.   ____________________________________________   FINAL CLINICAL IMPRESSION(S) / ED DIAGNOSES  Final diagnoses:  Right flank pain  Lower urinary tract infectious disease     Note: This dictation was prepared with Dragon dictation. Any transcriptional errors that result from this process are unintentional     Nance Pear, MD 03/14/17 2119

## 2017-03-14 NOTE — ED Notes (Signed)
Patient states, "I haven't been able to walk today because I've been so dizzy and light headed."

## 2017-03-14 NOTE — Discharge Instructions (Signed)
Please seek medical attention for any high fevers, chest pain, shortness of breath, change in behavior, persistent vomiting, bloody stool or any other new or concerning symptoms.  

## 2017-03-14 NOTE — ED Triage Notes (Signed)
Patient presents to the ED with right flank pain that began this morning at 8am.  Patient denies dysuria or issues with urination.  Patient is unable to get comfortable during triage.  Patient reports history of cyst to right kidney.  Denies history of kidney stones.

## 2017-03-14 NOTE — ED Notes (Signed)
Lab notified to add-on urine culture to previously collected UA at this time.

## 2017-03-16 LAB — URINE CULTURE

## 2017-06-28 ENCOUNTER — Observation Stay
Admission: EM | Admit: 2017-06-28 | Discharge: 2017-06-29 | Disposition: A | Discharge status: Home / Self Care | Payer: Medicare Other | Attending: Internal Medicine | Admitting: Internal Medicine

## 2017-06-28 ENCOUNTER — Emergency Department: Payer: Medicare Other

## 2017-06-28 ENCOUNTER — Other Ambulatory Visit: Payer: Self-pay

## 2017-06-28 ENCOUNTER — Encounter: Payer: Self-pay | Admitting: Emergency Medicine

## 2017-06-28 DIAGNOSIS — Z79899 Other long term (current) drug therapy: Secondary | ICD-10-CM | POA: Diagnosis not present

## 2017-06-28 DIAGNOSIS — G459 Transient cerebral ischemic attack, unspecified: Principal | ICD-10-CM | POA: Diagnosis present

## 2017-06-28 DIAGNOSIS — I10 Essential (primary) hypertension: Secondary | ICD-10-CM | POA: Insufficient documentation

## 2017-06-28 DIAGNOSIS — F17210 Nicotine dependence, cigarettes, uncomplicated: Secondary | ICD-10-CM | POA: Insufficient documentation

## 2017-06-28 DIAGNOSIS — I700 Atherosclerosis of aorta: Secondary | ICD-10-CM | POA: Insufficient documentation

## 2017-06-28 DIAGNOSIS — I739 Peripheral vascular disease, unspecified: Secondary | ICD-10-CM | POA: Diagnosis not present

## 2017-06-28 DIAGNOSIS — Z8572 Personal history of non-Hodgkin lymphomas: Secondary | ICD-10-CM | POA: Insufficient documentation

## 2017-06-28 DIAGNOSIS — I6501 Occlusion and stenosis of right vertebral artery: Secondary | ICD-10-CM | POA: Diagnosis not present

## 2017-06-28 DIAGNOSIS — I6523 Occlusion and stenosis of bilateral carotid arteries: Secondary | ICD-10-CM | POA: Insufficient documentation

## 2017-06-28 DIAGNOSIS — Z8619 Personal history of other infectious and parasitic diseases: Secondary | ICD-10-CM | POA: Insufficient documentation

## 2017-06-28 DIAGNOSIS — I371 Nonrheumatic pulmonary valve insufficiency: Secondary | ICD-10-CM | POA: Insufficient documentation

## 2017-06-28 DIAGNOSIS — R05 Cough: Secondary | ICD-10-CM | POA: Diagnosis not present

## 2017-06-28 DIAGNOSIS — R200 Anesthesia of skin: Secondary | ICD-10-CM | POA: Diagnosis present

## 2017-06-28 DIAGNOSIS — Z7982 Long term (current) use of aspirin: Secondary | ICD-10-CM | POA: Insufficient documentation

## 2017-06-28 HISTORY — DX: Non-Hodgkin lymphoma, unspecified, in remission: C85.9A

## 2017-06-28 HISTORY — DX: Essential (primary) hypertension: I10

## 2017-06-28 HISTORY — DX: Non-Hodgkin lymphoma, unspecified, unspecified site: C85.90

## 2017-06-28 LAB — URINALYSIS, ROUTINE W REFLEX MICROSCOPIC
BILIRUBIN URINE: NEGATIVE
Bacteria, UA: NONE SEEN
Glucose, UA: NEGATIVE mg/dL
Ketones, ur: NEGATIVE mg/dL
Leukocytes, UA: NEGATIVE
NITRITE: NEGATIVE
Protein, ur: NEGATIVE mg/dL
SPECIFIC GRAVITY, URINE: 1.004 — AB (ref 1.005–1.030)
Squamous Epithelial / LPF: NONE SEEN
pH: 6 (ref 5.0–8.0)

## 2017-06-28 LAB — ETHANOL

## 2017-06-28 LAB — URINE DRUG SCREEN, QUALITATIVE (ARMC ONLY)
Amphetamines, Ur Screen: NOT DETECTED
BENZODIAZEPINE, UR SCRN: NOT DETECTED
Barbiturates, Ur Screen: NOT DETECTED
CANNABINOID 50 NG, UR ~~LOC~~: NOT DETECTED
Cocaine Metabolite,Ur ~~LOC~~: NOT DETECTED
MDMA (Ecstasy)Ur Screen: NOT DETECTED
Methadone Scn, Ur: NOT DETECTED
Opiate, Ur Screen: NOT DETECTED
PHENCYCLIDINE (PCP) UR S: NOT DETECTED
TRICYCLIC, UR SCREEN: NOT DETECTED

## 2017-06-28 LAB — DIFFERENTIAL
BASOS PCT: 1 %
Basophils Absolute: 0.1 10*3/uL (ref 0–0.1)
Eosinophils Absolute: 0.1 10*3/uL (ref 0–0.7)
Eosinophils Relative: 2 %
LYMPHS ABS: 2.7 10*3/uL (ref 1.0–3.6)
Lymphocytes Relative: 35 %
MONO ABS: 0.7 10*3/uL (ref 0.2–0.9)
Monocytes Relative: 9 %
NEUTROS ABS: 4.2 10*3/uL (ref 1.4–6.5)
Neutrophils Relative %: 53 %

## 2017-06-28 LAB — PROTIME-INR
INR: 0.97
Prothrombin Time: 12.8 seconds (ref 11.4–15.2)

## 2017-06-28 LAB — TROPONIN I

## 2017-06-28 LAB — COMPREHENSIVE METABOLIC PANEL
ALK PHOS: 143 U/L — AB (ref 38–126)
ALT: 16 U/L (ref 14–54)
AST: 21 U/L (ref 15–41)
Albumin: 4.4 g/dL (ref 3.5–5.0)
Anion gap: 11 (ref 5–15)
BILIRUBIN TOTAL: 0.5 mg/dL (ref 0.3–1.2)
BUN: 20 mg/dL (ref 6–20)
CALCIUM: 9 mg/dL (ref 8.9–10.3)
CHLORIDE: 104 mmol/L (ref 101–111)
CO2: 22 mmol/L (ref 22–32)
CREATININE: 1.07 mg/dL — AB (ref 0.44–1.00)
GFR, EST AFRICAN AMERICAN: 59 mL/min — AB (ref >60)
GFR, EST NON AFRICAN AMERICAN: 51 mL/min — AB (ref >60)
Glucose, Bld: 107 mg/dL — ABNORMAL HIGH (ref 65–99)
Potassium: 3.5 mmol/L (ref 3.5–5.1)
Sodium: 137 mmol/L (ref 135–145)
TOTAL PROTEIN: 8.1 g/dL (ref 6.5–8.1)

## 2017-06-28 LAB — CBC
HCT: 36.9 % (ref 35.0–47.0)
HEMOGLOBIN: 12.5 g/dL (ref 12.0–16.0)
MCH: 31.3 pg (ref 26.0–34.0)
MCHC: 33.8 g/dL (ref 32.0–36.0)
MCV: 92.7 fL (ref 80.0–100.0)
Platelets: 217 10*3/uL (ref 150–440)
RBC: 3.98 MIL/uL (ref 3.80–5.20)
RDW: 13.6 % (ref 11.5–14.5)
WBC: 7.7 10*3/uL (ref 3.6–11.0)

## 2017-06-28 LAB — GLUCOSE, CAPILLARY: Glucose-Capillary: 109 mg/dL — ABNORMAL HIGH (ref 65–99)

## 2017-06-28 LAB — APTT: aPTT: 25 seconds (ref 24–36)

## 2017-06-28 MED ORDER — ONDANSETRON HCL 4 MG/2ML IJ SOLN: 4.0000 mg | Freq: Four times a day (QID) | INTRAMUSCULAR | Status: DC | PRN

## 2017-06-28 MED ORDER — LOSARTAN POTASSIUM 25 MG PO TABS
12.5000 mg | ORAL_TABLET | Freq: Every day | ORAL | Status: DC
  Administered 2017-06-29: 12.5 mg via ORAL
  Filled 2017-06-28: qty 1

## 2017-06-28 MED ORDER — ASPIRIN 81 MG PO CHEW
324.0000 mg | CHEWABLE_TABLET | Freq: Once | ORAL | Status: AC
Start: 2017-06-28 — End: 2017-06-28
  Administered 2017-06-28: 324 mg via ORAL
  Filled 2017-06-28: qty 4

## 2017-06-28 MED ORDER — ASPIRIN 325 MG PO TABS
325.0000 mg | ORAL_TABLET | Freq: Every day | ORAL | Status: DC
  Administered 2017-06-29: 325 mg via ORAL
  Filled 2017-06-28: qty 1

## 2017-06-28 MED ORDER — FLUTICASONE PROPIONATE 50 MCG/ACT NA SUSP
1.0000 | Freq: Every day | NASAL | Status: DC
  Administered 2017-06-29: 1 via NASAL
  Filled 2017-06-28: qty 16

## 2017-06-28 MED ORDER — AMLODIPINE BESYLATE 5 MG PO TABS
5.0000 mg | ORAL_TABLET | Freq: Every day | ORAL | Status: DC
  Administered 2017-06-29: 5 mg via ORAL
  Filled 2017-06-28: qty 1

## 2017-06-28 MED ORDER — ACETAMINOPHEN 650 MG RE SUPP: 650.0000 mg | RECTAL | Status: DC | PRN

## 2017-06-28 MED ORDER — ENOXAPARIN SODIUM 40 MG/0.4ML ~~LOC~~ SOLN
40.0000 mg | SUBCUTANEOUS | Status: DC
  Administered 2017-06-28: 40 mg via SUBCUTANEOUS
  Filled 2017-06-28: qty 0.4

## 2017-06-28 MED ORDER — ACETAMINOPHEN 325 MG PO TABS: 650.0000 mg | ORAL_TABLET | ORAL | Status: DC | PRN

## 2017-06-28 MED ORDER — HYDRALAZINE HCL 20 MG/ML IJ SOLN: 10.0000 mg | Freq: Four times a day (QID) | INTRAMUSCULAR | Status: DC | PRN

## 2017-06-28 MED ORDER — PANTOPRAZOLE SODIUM 40 MG PO TBEC
40.0000 mg | DELAYED_RELEASE_TABLET | Freq: Every day | ORAL | Status: DC
  Administered 2017-06-28 – 2017-06-29 (×2): 40 mg via ORAL
  Filled 2017-06-28 (×2): qty 1

## 2017-06-28 MED ORDER — SODIUM CHLORIDE 0.9% FLUSH
3.0000 mL | Freq: Two times a day (BID) | INTRAVENOUS | Status: DC
  Administered 2017-06-28 – 2017-06-29 (×2): 3 mL via INTRAVENOUS

## 2017-06-28 MED ORDER — SODIUM CHLORIDE 0.9% FLUSH
3.0000 mL | Freq: Two times a day (BID) | INTRAVENOUS | Status: DC
  Administered 2017-06-28: 3 mL via INTRAVENOUS

## 2017-06-28 MED ORDER — STROKE: EARLY STAGES OF RECOVERY BOOK: Freq: Once | Status: DC

## 2017-06-28 MED ORDER — NICOTINE 14 MG/24HR TD PT24
14.0000 mg | MEDICATED_PATCH | Freq: Every day | TRANSDERMAL | Status: DC
  Administered 2017-06-28 – 2017-06-29 (×2): 14 mg via TRANSDERMAL
  Filled 2017-06-28 (×2): qty 1

## 2017-06-28 MED ORDER — ASPIRIN 300 MG RE SUPP: 300.0000 mg | Freq: Every day | RECTAL | Status: DC

## 2017-06-28 MED ORDER — ACETAMINOPHEN 160 MG/5ML PO SOLN: 650.0000 mg | ORAL | Status: DC | PRN

## 2017-06-28 NOTE — Consult Note (Signed)
Date: 06/28/2017  [facility] Tele[facility] Consult Services  Impression:  Acute Ischemic Stroke versus TIA - left hemispheric.  Cervical spine lesion also a possibility.  Given positional nature of the symptoms, peripheral neuropathy is also on differential but this would not explain the facial symptoms   Not a tpa candidate due to: NIHSS 0  Does not meet LVO screening criteria (no aphasia, neglect, gaze deviation, dense hemiparesis, or visual field deficits on exam), therefore advanced imaging is not indicated.   Differential Diagnosis:   1. Cardioembolic stroke  2. Small vessel disease/lacune  3. Thromboembolic, artery-to-artery mechanism  4. Hypercoagulable state-related infarct  5. Transient ischemic attack  6. Thrombotic mechanism, large artery disease   Comments: Door [id number] [facility] contacted: [phone] [facility] at bedside: 1620 NIHSS assessment start time (time the consultation begins):1622 Last known well time (LKW): 1430  Recommendations:  1. Admit to stroke/telemetry 2. Head of bed flat 3. IV Fluids, NS 4. Aspirin if no contraindications 5. DVT prophylaxis 6. Dysphagia screen 7. Neuro checks 8. In house neurology consult.  9. Further stroke workup per in house neurology/internal medicine  Discussed with ED attending.  Please call [facility] if you have any questions: [phone]  -----------------------------------------------------------------------------------------  CC Right sided numbness/tingling  History of Present Illness   Patient is a 72 year old woman who presents with right sided tingling and numbness.  The first episode occurred at 9:30 AM but completely resolved when she sat down.  She had another episode when she stood up at 2:30 pm.  The numbness/tingling involved her face and arm.  She admits to chronic weakness in her hands and legs for years (occurring after chemo).  The numbness/tingling is now  improving   Diagnostic: CT head reviewed: no acute intracranial abnormality.  Exam: Patient is in no apparent distress. Patient appears as stated age. No obvious acute respiratory or cardiac distress. Patient is well groomed and well-nourished.  1a- LOC: Keenly responsive - 0  1b- LOC questions: Answers both questions correctly - 0  1c- LOC commands- Performs both tasks correctly- 0  2- Gaze: Normal; no gaze paresis or gaze deviation - 0  3- Visual Fields: 0  4- Facial movements: 0  5- Upper limb motor - 0  6- Lower limb motor - 0  7- Limb Coordination: absent ataxia - 0  8- Sensory: 0 9- Language - 0  10- Speech - 0  11- Neglect - none found - 0  NIHSS score: 0   Medical Decision Making:  - Extensive number of diagnosis or management options are considered above. - Extensive amount of complex data reviewed. - High risk of complication and/or morbidity or mortality are associated with differential diagnostic considerations above.  - There may be Uncertain outcome and increased probability of prolonged functional impairment or high probability of severe prolonged functional impairment associated with some of these differential diagnosis.  Medical Data Reviewed:  1.Data reviewed include clinical labs, radiology,Medical Tests; 2.Tests results discussed w/performing or interpreting physician; 3.Obtaining/reviewing old medical records;  4.Obtaining case history from another source;  5.Independent review of image, tracing or specimen.   Patient was informed the Neurology Consult would happen via telehealth (remote video) and consented to receiving care in this manner.

## 2017-06-28 NOTE — H&P (Signed)
[facility] [place] NAME: Ashley Lynch    MR#:  [id number]  DATE OF BIRTH:  [birth year]  DATE OF ADMISSION:  06/28/2017  PRIMARY CARE PHYSICIAN: System, Pcp Not In   REQUESTING/REFERRING PHYSICIAN: Dr. Alfred Levins  CHIEF COMPLAINT:   Chief Complaint  Patient presents with  . Code Stroke    HISTORY OF PRESENT ILLNESS:  Ashley Lynch  is a 72 y.o. female with a known history of carotid stenosis, hypertension presents to the hospital complaining of tingling of the right side which started early in the morning.  Symptoms improved.  Later at 3 PM she had the symptoms again involving right side of the face mouth and lips.  Concerning that she arrived in the emergency room and slowly symptoms improved.  At this time patient has no residual symptoms.  No focal weakness or trouble swallowing.  No change in vision.  No prior history of stroke.  She did have right-sided carotid endarterectomy 10 years back.  Recently had routine carotid ultrasound in the hospital and was called and told these results were abnormal and has been scheduled for a CTA of the neck next Tuesday.  Today CT of the head showed no acute stroke.  Patient has been seen by tele neurology and advised admission.  PAST MEDICAL HISTORY:   Past Medical History:  Diagnosis Date  . Cancer ([place])    lymphoma  . History of chicken pox   . History of measles, mumps, or rubella   . HTN (hypertension)   . Lymphoma in remission ([place])     PAST SURGICAL HISTORY:   Past Surgical History:  Procedure Laterality Date  . ABDOMINAL HYSTERECTOMY  2013  . [place]  . TONSILLECTOMY  2010    SOCIAL HISTORY:   Social History   Tobacco Use  . Smoking status: Current Every Day Smoker    Packs/day: 1.00    Years: 52.00    Pack years: 52.00    Types: Cigarettes  . Smokeless tobacco: Current User  Substance Use Topics  . Alcohol use: No    FAMILY HISTORY:   Family History   Problem Relation Age of Onset  . Diabetes Mother        sibling and grandparent also    DRUG ALLERGIES:  No Known Allergies  REVIEW OF SYSTEMS:   Review of Systems  Constitutional: Positive for malaise/fatigue. Negative for chills, fever and weight loss.  HENT: Negative for hearing loss and nosebleeds.   Eyes: Negative for blurred vision, double vision and pain.  Respiratory: Negative for cough, hemoptysis, sputum production, shortness of breath and wheezing.   Cardiovascular: Negative for chest pain, palpitations, orthopnea and leg swelling.  Gastrointestinal: Negative for abdominal pain, constipation, diarrhea, nausea and vomiting.  Genitourinary: Negative for dysuria and hematuria.  Musculoskeletal: Negative for back pain, falls and myalgias.  Skin: Negative for rash.  Neurological: Positive for sensory change. Negative for dizziness, tremors, speech change, focal weakness, seizures and headaches.  Endo/Heme/Allergies: Does not bruise/bleed easily.  Psychiatric/Behavioral: Negative for depression and memory loss. The patient is not nervous/anxious.     MEDICATIONS AT HOME:   Prior to Admission medications   Medication Sig Start Date End Date Taking? Authorizing Provider  amLODipine (NORVASC) 5 MG tablet Take 1 tablet each morning 06/19/16  Yes Arlis Porta., MD  aspirin 81 MG tablet Take 81 mg by mouth daily.   Yes [provider]  Homeopathic Products (LEG  CRAMP RELIEF PO) Take 1 tablet by mouth daily.    Yes [provider]  losartan (COZAAR) 25 MG tablet Take 1 tablet daily. Patient taking differently: 12.5 mg. Take 1 tablet daily. 06/19/16  Yes Arlis Porta., MD  omeprazole (PRILOSEC) 20 MG capsule Take 1 capsule (20 mg total) by mouth daily. 11/27/16  Yes Karamalegos, Devonne Doughty, DO  cephALEXin (KEFLEX) 500 MG capsule Take 1 capsule (500 mg total) by mouth 3 (three) times daily. Patient not taking: Reported on 06/28/2017 03/14/17   Nance Pear, MD  fluticasone [facility]) 50 MCG/ACT nasal spray instill 2 sprays into each nostril once daily Patient taking differently: instill 2 sprays into each nostril as needed 01/11/16   Arlis Porta., MD  phenazopyridine (PYRIDIUM) 200 MG tablet Take 1 tablet (200 mg total) by mouth 3 (three) times daily as needed for pain. Patient not taking: Reported on 06/28/2017 03/14/17 03/14/18  Nance Pear, MD     VITAL SIGNS:  Blood pressure (!) 195/90, pulse 78, temperature 98.4 F (36.9 C), temperature source Oral, resp. rate 18, height 5\' 4"  (1.626 m), weight 70.1 kg (154 lb 8.7 oz), SpO2 98 %.  PHYSICAL EXAMINATION:  Physical Exam  GENERAL:  72 y.o.-year-old patient lying in the bed with no acute distress.  EYES: Pupils equal, round, reactive to light and accommodation. No scleral icterus. Extraocular muscles intact.  HEENT: Head atraumatic, normocephalic. Oropharynx and nasopharynx clear. No oropharyngeal erythema, moist oral mucosa  NECK:  Supple, no jugular venous distention. No thyroid enlargement, no tenderness.  LUNGS: Normal breath sounds bilaterally, no wheezing, rales, rhonchi. No use of accessory muscles of respiration.  CARDIOVASCULAR: S1, S2 normal. No murmurs, rubs, or gallops.  ABDOMEN: Soft, nontender, nondistended. Bowel sounds present. No organomegaly or mass.  EXTREMITIES: No pedal edema, cyanosis, or clubbing. + 2 pedal & radial pulses b/l.   NEUROLOGIC: Cranial nerves II through XII are intact. No focal Motor or sensory deficits appreciated b/l PSYCHIATRIC: The patient is alert and oriented x 3. Good affect.  SKIN: No obvious rash, lesion, or ulcer.   LABORATORY PANEL:   CBC Recent Labs  Lab 06/28/17 1615  WBC 7.7  HGB 12.5  HCT 36.9  PLT 217   ------------------------------------------------------------------------------------------------------------------  Chemistries  Recent Labs  Lab 06/28/17 1615  NA 137  K 3.5  CL 104  CO2 22  GLUCOSE  107*  BUN 20  CREATININE 1.07*  CALCIUM 9.0  AST 21  ALT 16  ALKPHOS 143*  BILITOT 0.5   ------------------------------------------------------------------------------------------------------------------  Cardiac Enzymes Recent Labs  Lab 06/28/17 1615  TROPONINI <0.03   ------------------------------------------------------------------------------------------------------------------  RADIOLOGY:  Ct Head Code Stroke Wo Contrast  Result Date: 06/28/2017 CLINICAL DATA:  Code stroke. Weakness in the right hand beginning this morning. New development of numbness in the mouth and tongue. EXAM: CT HEAD WITHOUT CONTRAST TECHNIQUE: Contiguous axial images were obtained from the base of the skull through the vertex without intravenous contrast. COMPARISON:  11/18/2005 FINDINGS: Brain: Mild generalized age related volume loss. No sign of acute infarction, mass lesion, hemorrhage, hydrocephalus or extra-axial collection. Vascular: There is atherosclerotic calcification of the major vessels at the base of the brain. Skull: Negative Sinuses/Orbits: Clear/normal Other: None ASPECTS ([place] Stroke Program Early CT Score) - Ganglionic level infarction (caudate, lentiform nuclei, internal capsule, insula, M1-M3 cortex): 7 - Supraganglionic infarction (M4-M6 cortex): 3 Total score (0-10 with 10 being normal): 10 IMPRESSION: 1. No acute finding.  Age related volume loss. 2. ASPECTS is 10.  3. These results were called by telephone at the time of interpretation on 06/28/2017 at 4:07 pm to Dr. Darel Hong , who verbally acknowledged these results. Electronically Signed   By: Nelson Chimes M.D.   On: 06/28/2017 16:09     IMPRESSION AND PLAN:   *Transient ischemic attack Patient is symptom-free at this time.  Unable to get MRI due to pelvic metal ring. Patient will be admitted to telemetry monitored floor.  Repeat CT scan of the head in the morning.  She does have history of carotid stenosis.  Get CTA neck.   Aspirin.  Patient does not want to take statins as she had myalgia in the past.  Consult neurology.  Get echocardiogram. Check hemoglobin A1c and fasting lipid profile.  *Carotid stenosis.  Recent ultrasound at her primary care physician's office was thought to be abnormal.  Will get CTA neck. She has seen Dr. dew in the past.  Had right-sided carotid endarterectomy 10 years back.  *Hypertension.  Continue with home medications.  IV hydralazine added.  All the records are reviewed and case discussed with ED provider. Management plans discussed with the patient, family and they are in agreement.  CODE STATUS: FULL CODE  TOTAL TIME TAKING CARE OF THIS PATIENT: 40 minutes.   Neita Carp M.D on 06/28/2017 at 5:31 PM  Between 7am to 6pm - Pager - [phone]  After 6pm go to [email or web address] - password [email or web address] [facility]  Office  [phone]  CC: Primary care physician; System, Pcp Not In  Note: This dictation was prepared with Dragon dictation along with smaller phrase technology. Any transcriptional errors that result from this process are unintentional.

## 2017-06-28 NOTE — ED Triage Notes (Signed)
Patient states she noticed weakness in her right hand this morning that resolved but then noticed numbness in her mouth and tongue, having brain fog, and some vision difficulties around 3pm.  Patient appears to have slight left sided facial droop.  Patient states she recently had an [place] of her carotid arteries and was told she needed to have a CT scan due to abnormal results.  Patient is alert and oriented x 4.  Grip strength is even.

## 2017-06-28 NOTE — Code Documentation (Signed)
Pt arrives with complaints of tingling and numbness on the right arm and face that began at 1500, pt states a similar episode at 0900 that completely resolved at 1100, upon arrival pt states symptoms are improving but still has some numbness in her right hand, code stroke activated in triage, pt taken from triage to CT for non-con head CT and then to room 8, NIHSS 0, tele- neuro consulted, family at bedside, no tPA due to low NIHSS and resolving symptoms, report off to [employer] and [employer] representative

## 2017-06-28 NOTE — ED Provider Notes (Signed)
[facility] Emergency Department Provider Note  ____________________________________________   First MD Initiated Contact with Patient 06/28/17 1607     (approximate)  I have reviewed the triage vital signs and the nursing notes.   HISTORY  Chief Complaint Code Stroke   HPI Ashley Lynch is a 72 y.o. female who self presents to the emergency department with roughly 1 hour episode of numbness in her right arm that began around 3 PM today.  The symptoms have subsequently resolved but they seem to exist from her mid upper arm down to her fingers.  They came on suddenly lasted about an hour and resolved on their own.  She never had any weakness.  No headache.  No neck pain.  No double vision or blurred vision.  No slurred speech.  No chest pain shortness of breath abdominal pain nausea or vomiting.  She does have a past medical history of "narrow carotid arteries" and is scheduled to have a CT angiogram later on this week.  She does take aspirin.  She has never had a stroke before.  Her symptoms were moderate in severity.  Past Medical History:  Diagnosis Date  . Cancer ([place])    lymphoma  . History of chicken pox   . History of measles, mumps, or rubella   . HTN (hypertension)   . Lymphoma in remission [facility])     Patient Active Problem List   Diagnosis Date Noted  . TIA (transient ischemic attack) 06/28/2017  . Chronic low back pain with left-sided sciatica 10/24/2016  . PVD (peripheral vascular disease) ([place]) 05/01/2016  . Right carotid bruit 05/01/2016  . Pedal edema 05/01/2016  . 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 Chest pain 11/01/2015  . Health maintenance examination 05/27/2015  . Depression 05/27/2015  . Colon cancer screening 05/27/2015  . Decreased hearing of left ear 05/27/2015  . Left leg swelling 05/27/2015  . Neuropathic pain, leg 01/01/2015  . Left leg pain 01/01/2015  . Essential hypertension 12/23/2014  .  Heartburn 11/18/2014  . Hypertension 11/18/2014  . Urinary frequency 11/18/2014  . Smoking 11/18/2014  . History of lymphoma 08/17/2014    Past Surgical History:  Procedure Laterality Date  . ABDOMINAL HYSTERECTOMY  2013  . [place]  . TONSILLECTOMY  2010    Prior to Admission medications   Medication Sig Start Date End Date Taking? Authorizing Provider  amLODipine (NORVASC) 5 MG tablet Take 1 tablet each morning 06/19/16  Yes Arlis Porta., MD  aspirin 81 MG tablet Take 81 mg by mouth daily.   Yes [provider]  Homeopathic Products (LEG CRAMP RELIEF PO) Take 1 tablet by mouth daily.    Yes [provider]  losartan (COZAAR) 25 MG tablet Take 1 tablet daily. Patient taking differently: 12.5 mg. Take 1 tablet daily. 06/19/16  Yes Arlis Porta., MD  omeprazole (PRILOSEC) 20 MG capsule Take 1 capsule (20 mg total) by mouth daily. 11/27/16  Yes Karamalegos, Devonne Doughty, DO  cephALEXin (KEFLEX) 500 MG capsule Take 1 capsule (500 mg total) by mouth 3 (three) times daily. Patient not taking: Reported on 06/28/2017 03/14/17   Nance Pear, MD  fluticasone [facility]) 50 MCG/ACT nasal spray instill 2 sprays into each nostril once daily Patient taking differently: instill 2 sprays into each nostril as needed 01/11/16   Arlis Porta., MD  phenazopyridine (PYRIDIUM) 200 MG tablet Take 1 tablet (200 mg total) by  mouth 3 (three) times daily as needed for pain. Patient not taking: Reported on 06/28/2017 03/14/17 03/14/18  Nance Pear, MD    Allergies Patient has no known allergies.  Family History  Problem Relation Age of Onset  . Diabetes Mother        sibling and grandparent also    Social History Social History   Tobacco Use  . Smoking status: Current Every Day Smoker    Packs/day: 1.00    Years: 52.00    Pack years: 52.00    Types: Cigarettes  . Smokeless tobacco: Current User  Substance Use Topics  . Alcohol use:  No  . Drug use: No    Review of Systems Constitutional: No fever/chills Eyes: No visual changes. ENT: No sore throat. Cardiovascular: Denies chest pain. Respiratory: Denies shortness of breath. Gastrointestinal: No abdominal pain.  No nausea, no vomiting.  No diarrhea.  No constipation. Genitourinary: Negative for dysuria. Musculoskeletal: Negative for back pain. Skin: Negative for rash. Neurological: For focal numbness   ____________________________________________   PHYSICAL EXAM:  VITAL SIGNS: ED Triage Vitals [06/28/17 1554]  Enc Vitals Group     BP (!) 195/90     Pulse Rate 78     Resp 18     Temp 98.4 F (36.9 C)     Temp Source Oral     SpO2 98 %     Weight      Height      Head Circumference      Peak Flow      Pain Score      Pain Loc      Pain Edu?      Excl. in [place]?     Constitutional: Alert and oriented x4 pleasant cooperative speaks full clear sentences no diaphoresis Eyes: PERRL EOMI. Head: Atraumatic. Nose: No congestion/rhinnorhea. Mouth/Throat: No trismus Neck: No stridor.   Cardiovascular: Normal rate, regular rhythm. Grossly normal heart sounds.  Good peripheral circulation. Respiratory: Normal respiratory effort.  No retractions. Lungs CTAB and moving good air Gastrointestinal: Soft nontender Musculoskeletal: No lower extremity edema   Neurologic:  Normal speech and language. No gross focal neurologic deficits are appreciated. Skin:  Skin is warm, dry and intact. No rash noted. Psychiatric: Mood and affect are normal. Speech and behavior are normal.    ____________________________________________   DIFFERENTIAL includes but not limited to  Stroke, TIA, intracerebral hemorrhage, peripheral neuropathy, metabolic derangement ____________________________________________   LABS (all labs ordered are listed, but only abnormal results are displayed)  Labs Reviewed  COMPREHENSIVE METABOLIC PANEL - Abnormal; Notable for the following  components:      Result Value   Glucose, Bld 107 (*)    Creatinine, Ser 1.07 (*)    Alkaline Phosphatase 143 (*)    GFR calc non Af Amer 51 (*)    GFR calc Af Amer 59 (*)    All other components within normal limits  GLUCOSE, CAPILLARY - Abnormal; Notable for the following components:   Glucose-Capillary 109 (*)    All other components within normal limits  PROTIME-INR  APTT  CBC  DIFFERENTIAL  TROPONIN I  ETHANOL  URINE DRUG SCREEN, QUALITATIVE ([facility] ONLY)  URINALYSIS, ROUTINE W REFLEX MICROSCOPIC  HEMOGLOBIN A1C  LIPID PANEL  CBG MONITORING, ED    Lab work reviewed by me with no acute disease __________________________________________  EKG  ED ECG REPORT I, Darel Hong, the attending physician, personally viewed and interpreted this ECG.  Date: 06/28/2017 EKG Time:  Rate: 70 Rhythm: normal  sinus rhythm QRS Axis: normal Intervals: normal ST/T Wave abnormalities: normal Narrative Interpretation: no evidence of acute ischemia __________________________________  RADIOLOGY  Head CT reviewed by me with no acute disease ____________________________________________   PROCEDURES  Procedure(s) performed: no  .Critical Care Performed by: Darel Hong, MD Authorized by: Darel Hong, MD   Critical care provider statement:    Critical care time (minutes):  30   Critical care time was exclusive of:  Separately billable procedures and treating other patients   Critical care was necessary to treat or prevent imminent or life-threatening deterioration of the following conditions:  CNS failure or compromise   Critical care was time spent personally by me on the following activities:  Development of treatment plan with patient or surrogate, discussions with consultants, evaluation of patient's response to treatment, examination of patient, obtaining history from patient or surrogate, ordering and performing treatments and interventions, ordering and review of  laboratory studies, ordering and review of radiographic studies, pulse oximetry, re-evaluation of patient's condition and review of old charts    Critical Care performed: yes  Observation: no ____________________________________________   INITIAL IMPRESSION / ASSESSMENT AND PLAN / ED COURSE  Pertinent labs & imaging results that were available during my care of the patient were reviewed by me and considered in my medical decision making (see chart for details).  The patient arrives with symptoms which are concerning for central etiology.  Code stroke activated in triage.  Head CT is pending as well as telemetry neurologist evaluation.     ----------------------------------------- 4:38 PM on 06/28/2017 -----------------------------------------  I was called by telemetry neurology that the patient has an NIH score of 0 and they do not recommend TPA.  Unfortunately the patient has hardware in her pelvis and is unable to get an MRI.  At this point the patient does require inpatient admission for further imaging of her carotid arteries and evaluation for possible carotid endarterectomy.  I discussed with the patient and family who verbalized understanding and agreement with the plan.  I then discussed with the hospitalist who has graciously agreed to admit the patient to his service.  I appreciate the patient's hypertension but in the setting of possible stroke versus TIA we will allow permissive hypertension up to a systolic of 248. ____________________________________________   FINAL CLINICAL IMPRESSION(S) / ED DIAGNOSES  Final diagnoses:  Numbness      NEW MEDICATIONS STARTED DURING THIS VISIT:  New Prescriptions   No medications on file     Note:  This document was prepared using Dragon voice recognition software and may include unintentional dictation errors.     Darel Hong, MD 06/28/17 [phone]

## 2017-06-29 ENCOUNTER — Observation Stay: Admit: 2017-06-29 | Payer: Medicare Other

## 2017-06-29 ENCOUNTER — Observation Stay: Payer: Medicare Other

## 2017-06-29 ENCOUNTER — Observation Stay (HOSPITAL_BASED_OUTPATIENT_CLINIC_OR_DEPARTMENT_OTHER)
Admit: 2017-06-29 | Discharge: 2017-06-29 | Disposition: A | Discharge status: Home / Self Care | Payer: Medicare Other | Attending: Internal Medicine | Admitting: Internal Medicine

## 2017-06-29 ENCOUNTER — Encounter: Payer: Self-pay | Admitting: Radiology

## 2017-06-29 DIAGNOSIS — I371 Nonrheumatic pulmonary valve insufficiency: Secondary | ICD-10-CM

## 2017-06-29 DIAGNOSIS — G459 Transient cerebral ischemic attack, unspecified: Secondary | ICD-10-CM | POA: Diagnosis not present

## 2017-06-29 LAB — LIPID PANEL
CHOL/HDL RATIO: 5 ratio
Cholesterol: 180 mg/dL (ref 0–200)
HDL: 36 mg/dL — ABNORMAL LOW (ref >40)
LDL Cholesterol: 109 mg/dL — ABNORMAL HIGH (ref 0–99)
Triglycerides: 176 mg/dL — ABNORMAL HIGH (ref <150)
VLDL: 35 mg/dL (ref 0–40)

## 2017-06-29 LAB — HEMOGLOBIN A1C
Hgb A1c MFr Bld: 5.5 % (ref 4.8–5.6)
Mean Plasma Glucose: 111.15 mg/dL

## 2017-06-29 LAB — ECHOCARDIOGRAM COMPLETE
HEIGHTINCHES: 64 in
Weight: 2457.6 oz

## 2017-06-29 MED ORDER — NICOTINE 14 MG/24HR TD PT24: 14.0000 mg | MEDICATED_PATCH | Freq: Every day | TRANSDERMAL | 0 refills | Status: DC

## 2017-06-29 MED ORDER — IOPAMIDOL (ISOVUE-370) INJECTION 76%
75.0000 mL | Freq: Once | INTRAVENOUS | Status: AC | PRN
  Administered 2017-06-29: 75 mL via INTRAVENOUS

## 2017-06-29 MED ORDER — ALUM & MAG HYDROXIDE-SIMETH 200-200-20 MG/5ML PO SUSP: 30.0000 mL | ORAL | Status: DC | PRN

## 2017-06-29 MED ORDER — OMEGA-3-ACID ETHYL ESTERS 1 G PO CAPS: 2.0000 g | ORAL_CAPSULE | Freq: Two times a day (BID) | ORAL | 2 refills | Status: DC

## 2017-06-29 NOTE — Care Management Obs Status (Signed)
[facility] NOTIFICATION   Patient Details  Name: ELIZETTE SHEK MRN: [id number] Date of Birth: 1946/01/12   Medicare Observation Status Notification Given:  Yes    Shelbie Ammons, RN 06/29/2017, 1:03 PM

## 2017-06-29 NOTE — Progress Notes (Signed)
Thsi RN called to pt's room by NT at 0330.  Patient c/o numbness in R hand going up R arm.  New NIHSS performed; score of 0.  Numbness resolved within 5 minutes.  MD notified via text page.

## 2017-06-29 NOTE — Progress Notes (Signed)
OT Cancellation Note  Patient Details Name: Ashley Lynch MRN: [id number] DOB: 11/06/1945   Cancelled Treatment:    Reason Eval/Treat Not Completed: Other (comment). Order received, chart reviewed. Initial attempt to see this am pt was receiving a repeat CT scan. On 2nd attempt this afternoon, pt discharged from hospital, per RN. Will sign off. Please re-consult as appropriate.  Jeni Salles, MPH, MS, OTR/L ascom [phone] 06/29/17, 3:02 PM

## 2017-06-29 NOTE — Progress Notes (Signed)
SLP Cancellation Note  Patient Details Name: Ashley Lynch MRN: [id number] DOB: 1946-03-28   Cancelled treatment:       Reason Eval/Treat Not Completed: SLP screened, no needs identified, will sign off(chart reviewed; consulted NSG then met w/ pt). Pt denied any difficulty swallowing and is currently on a regular diet; tolerates swallowing pills w/ water per NSG. Pt conversed at conversational level w/out deficits noted; pt and family denied any speech-language deficits.  No further skilled ST services indicated as pt appears at her baseline. Pt agreed. NSG to reconsult if any change in status.     Orinda Kenner, [place], CCC-SLP Watson,Katherine 06/29/2017, 12:34 PM

## 2017-06-29 NOTE — Discharge Summary (Signed)
[place] at [facility], 72 y.o., DOB 1945-08-14, MRN [id number]. Admission date: 06/28/2017 Discharge Date 06/29/2017 Primary MD Patient, No Pcp Per Admitting Physician Hillary Bow, MD  Admission Diagnosis  Numbness [R20.0]  Discharge Diagnosis   Active Problems: TIA (transient ischemic attack) Carotid vascular disease with plaque Essential hypertension Lipidemia Nicotine abuse       Hospital Course Ashley Lynch  is a 72 y.o. female with a known history of carotid stenosis, hypertension presents to the hospital complaining of tingling of the right side which started early in the morning.  Patient was admitted had a CT scan of the head which was negative.  She apparently had carotid Dopplers outside that showed that she may have carotid stenosis.  Due to these symptoms she was admitted for TIA workup.  She could not have an MRI due to screws in her pelvis according to her.  She had a CT angiogram of her neck which showed plaque and mild stenosis but no significant stenosis that warrants surgical vascular intervention.  Patient does smoke heavily and I strongly recommended she stop smoking.  Patient feeling back to baseline now      Nicotine abuse smoking cessation provided 4 minutes spent nicotine patch will be started patient recommended to stop smoking        Consults  None  Significant Tests:  See full reports for all details     Ct Head Wo Contrast  Result Date: 06/29/2017 CLINICAL DATA:  Tingling sensation right face EXAM: CT HEAD WITHOUT CONTRAST TECHNIQUE: Contiguous axial images were obtained from the base of the skull through the vertex without intravenous contrast. COMPARISON:  June 28, 2017 FINDINGS: Brain: The ventricles are normal in size and configuration. There is no intracranial mass, hemorrhage, extra-axial fluid collection, or midline shift. Gray-white compartments appear normal. No evident acute infarct. Vascular:  No evident hyperdense vessel. There is calcification in each carotid siphon and distal vertebral artery. Skull: The bony calvarium appears intact. Sinuses/Orbits: There is opacification in anterior left ethmoid air cell. There is mucosal thickening in the posterior left sphenoid sinus region. Other visualized paranasal sinuses are clear. Orbits appear symmetric bilaterally. Other: There is opacification in several mastoid air cells on both sides inferiorly. IMPRESSION: 1. No intracranial mass or hemorrhage. The gray-white compartments appear normal. 2.  Areas of arterial vascular calcification noted. 3. Areas of paranasal sinus disease. Opacification in each inferior mastoid air cell. Electronically Signed   By: Lowella Grip III M.D.   On: 06/29/2017 09:31   Ct Angio Neck W Or Wo Contrast  Result Date: 06/29/2017 CLINICAL DATA:  Stroke. Arterial stricture/occlusion. Right-sided tingling. EXAM: CT ANGIOGRAPHY NECK TECHNIQUE: Multidetector CT imaging of the neck was performed using the standard protocol during bolus administration of intravenous contrast. Multiplanar CT image reconstructions and MIPs were obtained to evaluate the vascular anatomy. Carotid stenosis measurements (when applicable) are obtained utilizing NASCET criteria, using the distal internal carotid diameter as the denominator. CONTRAST:  25mL ISOVUE-370 IOPAMIDOL (ISOVUE-370) INJECTION 76% COMPARISON:  CT head 06/29/2017, 06/28/2017 FINDINGS: Aortic arch: Atherosclerotic disease in the aortic arch and proximal great vessels. Proximal great vessels are patent without significant stenosis. Right carotid system: Atherosclerotic calcification in the right common carotid artery without significant narrowing. Atherosclerotic disease right carotid bifurcation with calcified and noncalcified plaque. No significant internal carotid artery stenosis. Moderate stenosis origin of right external carotid artery. Left carotid system: Atherosclerotic  calcification in the left common carotid artery without significant  stenosis. Irregular atherosclerotic plaque at the left carotid bifurcation. Predominantly calcific disease however there is a linear filling defect in the carotid bulb which may represent short segment dissection or atherosclerotic plaque. Based on linear morphology, acute thrombus not considered likely but not completely excluded. Less than 25% diameter stenosis left internal carotid artery. Moderate stenosis origin of left external carotid artery. Vertebral arteries: Both vertebral arteries patent to the basilar. Atherosclerotic plaque proximal and distal right vertebral artery with mild stenosis. No other significant vertebral stenosis. Skeleton: Cervical spondylosis.  No acute skeletal abnormality. Other neck: Negative for mass or adenopathy. Upper chest: Negative IMPRESSION: Atherosclerotic calcification aortic arch, bilateral, and carotid artery and at the carotid bifurcation bilaterally. Atherosclerotic disease right carotid bifurcation. Right internal carotid artery adequately patent without significant stenosis. Moderate stenosis origin of right external carotid artery Irregular calcified and noncalcified plaque left carotid bifurcation. Short segment linear filling defect in the carotid bulb may represent short segment dissection or atherosclerotic plaque. Less likely thrombus. Less than 25% diameter stenosis left internal carotid artery and moderate stenosis left external carotid artery Mild stenosis proximal and distal right vertebral artery due to atherosclerotic plaque. Cough Electronically Signed   By: Franchot Gallo M.D.   On: 06/29/2017 09:42   Ct Head Code Stroke Wo Contrast  Result Date: 06/28/2017 CLINICAL DATA:  Code stroke. Weakness in the right hand beginning this morning. New development of numbness in the mouth and tongue. EXAM: CT HEAD WITHOUT CONTRAST TECHNIQUE: Contiguous axial images were obtained from the base of  the skull through the vertex without intravenous contrast. COMPARISON:  11/18/2005 FINDINGS: Brain: Mild generalized age related volume loss. No sign of acute infarction, mass lesion, hemorrhage, hydrocephalus or extra-axial collection. Vascular: There is atherosclerotic calcification of the major vessels at the base of the brain. Skull: Negative Sinuses/Orbits: Clear/normal Other: None ASPECTS ([place] [facility] Stroke Program Early CT Score) - Ganglionic level infarction (caudate, lentiform nuclei, internal capsule, insula, M1-M3 cortex): 7 - Supraganglionic infarction (M4-M6 cortex): 3 Total score (0-10 with 10 being normal): 10 IMPRESSION: 1. No acute finding.  Age related volume loss. 2. ASPECTS is 10. 3. These results were called by telephone at the time of interpretation on 06/28/2017 at 4:07 pm to Dr. Darel Hong , who verbally acknowledged these results. Electronically Signed   By: Nelson Chimes M.D.   On: 06/28/2017 16:09       Today   Subjective:   Ashley Lynch feeling better wants to go home Objective:   Blood pressure (!) 143/62, pulse 70, temperature 97.8 F (36.6 C), temperature source Oral, resp. rate 16, height 5\' 4"  (1.626 m), weight 153 lb 9.6 oz (69.7 kg), SpO2 96 %.  .  Intake/Output Summary (Last 24 hours) at 06/29/2017 1430 Last data filed at 06/29/2017 0947 Gross per 24 hour  Intake 246 ml  Output 0 ml  Net 246 ml    Exam VITAL SIGNS: Blood pressure (!) 143/62, pulse 70, temperature 97.8 F (36.6 C), temperature source Oral, resp. rate 16, height 5\' 4"  (1.626 m), weight 153 lb 9.6 oz (69.7 kg), SpO2 96 %.  GENERAL:  72 y.o.-year-old patient lying in the bed with no acute distress.  EYES: Pupils equal, round, reactive to light and accommodation. No scleral icterus. Extraocular muscles intact.  HEENT: Head atraumatic, normocephalic. Oropharynx and nasopharynx clear.  NECK:  Supple, no jugular venous distention. No thyroid enlargement, no tenderness.  LUNGS: Normal breath  sounds bilaterally, no wheezing, rales,rhonchi or crepitation. No use of accessory muscles of  respiration.  CARDIOVASCULAR: S1, S2 normal. No murmurs, rubs, or gallops.  ABDOMEN: Soft, nontender, nondistended. Bowel sounds present. No organomegaly or mass.  EXTREMITIES: No pedal edema, cyanosis, or clubbing.  NEUROLOGIC: Cranial nerves II through XII are intact. Muscle strength 5/5 in all extremities. Sensation intact. Gait not checked.  PSYCHIATRIC: The patient is alert and oriented x 3.  SKIN: No obvious rash, lesion, or ulcer.   Data Review     CBC w Diff:  Lab Results  Component Value Date   WBC 7.7 06/28/2017   HGB 12.5 06/28/2017   HGB 12.0 06/23/2015   HCT 36.9 06/28/2017   HCT 35.7 06/23/2015   PLT 217 06/28/2017   PLT 248 06/23/2015   LYMPHOPCT 35 06/28/2017   LYMPHOPCT 27.2 12/19/2013   MONOPCT 9 06/28/2017   MONOPCT 7.8 12/19/2013   EOSPCT 2 06/28/2017   EOSPCT 1.5 12/19/2013   BASOPCT 1 06/28/2017   BASOPCT 0.8 12/19/2013   CMP:  Lab Results  Component Value Date   NA 137 06/28/2017   NA 136 06/23/2015   NA 139 12/19/2013   K 3.5 06/28/2017   K 3.9 12/19/2013   CL 104 06/28/2017   CL 103 12/19/2013   CO2 22 06/28/2017   CO2 28 12/19/2013   BUN 20 06/28/2017   BUN 17 06/23/2015   BUN 15 12/19/2013   CREATININE 1.07 (H) 06/28/2017   CREATININE 1.07 (H) 06/19/2016   PROT 8.1 06/28/2017   PROT 6.9 06/23/2015   PROT 7.5 12/19/2013   ALBUMIN 4.4 06/28/2017   ALBUMIN 4.2 06/23/2015   ALBUMIN 3.6 12/19/2013   BILITOT 0.5 06/28/2017   BILITOT 0.3 06/23/2015   BILITOT 0.3 12/19/2013   ALKPHOS 143 (H) 06/28/2017   ALKPHOS 166 (H) 12/19/2013   AST 21 06/28/2017   AST 18 12/19/2013   ALT 16 06/28/2017   ALT 27 12/19/2013  .  Micro Results No results found for this or any previous visit (from the past 240 hour(s)).      Code Status Orders  (From admission, onward)        Start     Ordered   06/28/17 1729  Full code  Continuous     06/28/17  1731    Code Status History    Date Active Date Inactive Code Status Order ID Comments User Context   This patient has a current code status but no historical code status.          Follow-up Information    pcp Follow up in 1 week(s).           Discharge Medications   Allergies as of 06/29/2017   No Known Allergies     Medication List    STOP taking these medications   cephALEXin 500 MG capsule Commonly known as:  KEFLEX     TAKE these medications   amLODipine 5 MG tablet Commonly known as:  NORVASC Take 1 tablet each morning   aspirin 81 MG tablet Take 81 mg by mouth daily.   fluticasone 50 MCG/ACT nasal spray Commonly known as:  FLONASE instill 2 sprays into each nostril once daily What changed:  See the new instructions.   LEG CRAMP RELIEF PO Take 1 tablet by mouth daily.   losartan 25 MG tablet Commonly known as:  COZAAR Take 1 tablet daily. What changed:    how much to take  additional instructions   nicotine 14 mg/24hr patch Commonly known as:  NICODERM CQ - dosed in mg/24 hours  Place 1 patch (14 mg total) onto the skin daily. Start taking on:  06/30/2017   omega-3 acid ethyl esters 1 g capsule Commonly known as:  LOVAZA Take 2 capsules (2 g total) by mouth 2 (two) times daily.   omeprazole 20 MG capsule Commonly known as:  PRILOSEC Take 1 capsule (20 mg total) by mouth daily.   phenazopyridine 200 MG tablet Commonly known as:  PYRIDIUM Take 1 tablet (200 mg total) by mouth 3 (three) times daily as needed for pain.          Total Time in preparing paper work, data evaluation and todays exam - 93 minutes  Dustin Flock M.D on 06/29/2017 at 2:30 PM [place]  [phone]

## 2017-06-29 NOTE — Progress Notes (Signed)
On-call MD returned call; queried if patient had been given aspirin, which she verbally confirmed she had been given in the ED.  MD states no further actions needed.

## 2017-06-29 NOTE — Progress Notes (Signed)
*  PRELIMINARY RESULTS* Echocardiogram 2D Echocardiogram has been performed.  Sherrie Sport 06/29/2017, 9:48 AM

## 2017-06-29 NOTE — Discharge Planning (Signed)
Discharge instructions reviewed with pt. Pt verbalizes understanding. Pt ready for discharge home. Family at bedside.

## 2017-06-29 NOTE — Evaluation (Signed)
Physical Therapy Evaluation Patient Details Name: Ashley Lynch MRN: [id number] DOB: [birth year] Today's Date: 06/29/2017   History of Present Illness  presented to ER secondary to R face, UE tingling (x2); admitted for TIA/CVA work-up.  CTH negative for acute change; unable to have MRI due to pelvic ring.  Symptoms fully resolved at this time per patient report  Clinical Impression  Upon evaluation, patient alert and oriented; follows all commands and demonstrates good insight/safety awareness.  Bilat UE/LE strength, ROM, sensation and coordination grossly symmetrical and WFL for basic transfers and mobility. No focal weakness appreciated.  Able to complete bed mobility with mod indep; sit/stand, basic transfers and gait (200') without assist device, sup/mod indep.  Good gait symmetry and overall fluidity; completing dynamic gait components (10' walk time, 6 seconds) without buckling, LOB or safety concern. Appears to be at baseline level of functional ability; patient, family in agreement.  No skilled PT needs identified at this time.  Will complete PT order; please re-consult should needs change.    Follow Up Recommendations No PT follow up    Equipment Recommendations       Recommendations for Other Services       Precautions / Restrictions Precautions Precautions: Fall Restrictions Weight Bearing Restrictions: No      Mobility  Bed Mobility Overal bed mobility: Modified Independent                Transfers     Transfers: Sit to/from Stand Sit to Stand: Supervision;Modified independent (Device/Increase time)         General transfer comment: minimal use of UEs with functional transfers; fair/good LE strenght and control  Ambulation/Gait Ambulation/Gait assistance: Supervision;Modified independent (Device/Increase time) Ambulation Distance (Feet): 200 Feet Assistive device: None   Gait velocity: 10' walk time, 6 seconds   General Gait Details: reciprocal  stepping pattern with good step height/length; normal trunk rotation; steady cadence and gait speed.  Completes dynamic gait speed without significant gait deviation, LOB or safety issue.  Stairs            Wheelchair Mobility    Modified Rankin (Stroke Patients Only)       Balance Overall balance assessment: Needs assistance Sitting-balance support: No upper extremity supported;Feet supported Sitting balance-Leahy Scale: Normal     Standing balance support: No upper extremity supported Standing balance-Leahy Scale: Good   Single Leg Stance - Right Leg: 3 Single Leg Stance - Left Leg: 10                         Pertinent Vitals/Pain Pain Assessment: No/denies pain    Home Living Family/patient expects to be discharged to:: Private residence Living Arrangements: Children Available Help at Discharge: Family Type of Home: House Home Access: Ramped entrance     Home Layout: One level Home Equipment: None      Prior Function Level of Independence: Independent         Comments: Indep with ADLs, household and limited community distances without assist device; denies fall history.     Hand Dominance   Dominant Hand: Right    Extremity/Trunk Assessment   Upper Extremity Assessment Upper Extremity Assessment: Overall WFL for tasks assessed(grossly 4+/5 throughout, no focal weakness, sensory or coordination deficits noted)    Lower Extremity Assessment Lower Extremity Assessment: Overall WFL for tasks assessed(grossly 4+/5 throughout, no focal weakness, sensory or coordination deficits noted)       Communication   Communication: No difficulties  Cognition Arousal/Alertness: Awake/alert Behavior During Therapy: WFL for tasks assessed/performed Overall Cognitive Status: Within Functional Limits for tasks assessed                                        General Comments      Exercises Other Exercises Other Exercises: Toilet  transfer, ambulatory without assist device, mod indep; sit/stand from standard toilet, mod indep; standing balance for hygiene, clothing management and hand hygiene (good functional reach, good awareness of limits of stability), mod indep   Assessment/Plan    PT Assessment Patent does not need any further PT services  PT Problem List         PT Treatment Interventions      PT Goals (Current goals can be found in the Care Plan section)  Acute Rehab PT Goals PT Goal Formulation: All assessment and education complete, DC therapy Time For Goal Achievement: 06/29/17 Potential to Achieve Goals: Good    Frequency     Barriers to discharge        Co-evaluation               AM-PAC PT "6 Clicks" Daily Activity  Outcome Measure Difficulty turning over in bed (including adjusting bedclothes, sheets and blankets)?: None Difficulty moving from lying on back to sitting on the side of the bed? : None Difficulty sitting down on and standing up from a chair with arms (e.g., wheelchair, bedside commode, etc,.)?: None Help needed moving to and from a bed to chair (including a wheelchair)?: None Help needed walking in hospital room?: None Help needed climbing 3-5 steps with a railing? : A Little 6 Click Score: 23    End of Session Equipment Utilized During Treatment: Gait belt Activity Tolerance: Patient tolerated treatment well Patient left: in bed;with call bell/phone within reach;with family/visitor present   PT Visit Diagnosis: Hemiplegia and hemiparesis Hemiplegia - Right/Left: Right Hemiplegia - dominant/non-dominant: Dominant Hemiplegia - caused by: Cerebral infarction    Time: 1145-1156 PT Time Calculation (min) (ACUTE ONLY): 11 min   Charges:   PT Evaluation $PT Eval Low Complexity: 1 Low     PT G Codes:         H. Owens Shark, PT, DPT, NCS 06/29/17, 12:06 PM [phone]

## 2017-06-29 NOTE — Progress Notes (Signed)
   06/29/17 1030  Clinical Encounter Type  Visited With Patient and family together  Visit Type Initial  Referral From Nurse  Consult/Referral To Chaplain  Spiritual Encounters  Spiritual Needs Prayer;Emotional   [place] was asked to check in on PT as she wanted to talk to the [facility]. Upon entering [facility], PT asked for a Bible. PT's grand-daughter told her that she just brought her Bible. [place] stayed and offered prayer for PT.

## 2017-09-18 ENCOUNTER — Encounter (INDEPENDENT_AMBULATORY_CARE_PROVIDER_SITE_OTHER): Payer: Self-pay | Admitting: Vascular Surgery

## 2017-09-18 ENCOUNTER — Ambulatory Visit (INDEPENDENT_AMBULATORY_CARE_PROVIDER_SITE_OTHER): Payer: Medicare Other | Admitting: Vascular Surgery

## 2017-09-18 VITALS — BP 157/87 | HR 81 | Resp 16 | Ht 63.0 in | Wt 162.0 lb

## 2017-09-18 DIAGNOSIS — F17200 Nicotine dependence, unspecified, uncomplicated: Secondary | ICD-10-CM

## 2017-09-18 DIAGNOSIS — I6523 Occlusion and stenosis of bilateral carotid arteries: Secondary | ICD-10-CM

## 2017-09-18 DIAGNOSIS — I739 Peripheral vascular disease, unspecified: Secondary | ICD-10-CM | POA: Diagnosis not present

## 2017-09-18 DIAGNOSIS — I6529 Occlusion and stenosis of unspecified carotid artery: Secondary | ICD-10-CM | POA: Insufficient documentation

## 2017-09-18 DIAGNOSIS — I10 Essential (primary) hypertension: Secondary | ICD-10-CM

## 2017-09-18 NOTE — Assessment & Plan Note (Signed)
Represents an atherosclerotic risk factor.  No interest in quitting.

## 2017-09-18 NOTE — Progress Notes (Signed)
Patient ID: Ashley Lynch, female   DOB: 09-Jan-1946, 72 y.o.   MRN: [id number]  Chief Complaint  Patient presents with  . Follow-up    Carotid stenosis    HPI Ashley Lynch is a 72 y.o. female.  I am asked to see the patient by Dr. Humphrey Rolls for evaluation of carotid stenosis.  The patient reports a stroke after a carotid ultrasound about 3 months ago.  She has had several episodes of her right arm going numb with positional changes in her neck.  She has known significant spine disease as well.  She was treated like a TIA but her carotid CT angiogram showed minimal carotid disease bilaterally.  I independently reviewed this CT scan and I would estimate about a 25 to 30% left ICA stenosis and minimal recurrent stenosis in the right ICA. She also complains of worsening pain in her legs.  She read our posterior on PAD and says she has "all those symptoms".  The pain is now waking her from sleep.  She was last seen about 2 years ago and has not been checked for PAD since that time to her knowledge.   Past Medical History:  Diagnosis Date  . Cancer ([place])    lymphoma  . History of chicken pox   . History of measles, mumps, or rubella   . HTN (hypertension)   . Lymphoma in remission [facility])     Past Surgical History:  Procedure Laterality Date  . ABDOMINAL HYSTERECTOMY  2013  . [place]  . TONSILLECTOMY  2010    Family History  Problem Relation Age of Onset  . Diabetes Mother        sibling and grandparent also  Brother with Alzheimer's disease No bleeding or clotting disorders  Social History Social History   Tobacco Use  . Smoking status: Current Every Day Smoker    Packs/day: 1.00    Years: 52.00    Pack years: 52.00    Types: Cigarettes  . Smokeless tobacco: Current User  Substance Use Topics  . Alcohol use: No  . Drug use: No    No Known Allergies  Current Outpatient Medications  Medication Sig Dispense Refill  . amLODipine (NORVASC) 5 MG  tablet Take 1 tablet each morning 30 tablet 12  . aspirin 81 MG tablet Take 81 mg by mouth daily.    . Cholecalciferol (VITAMIN D3) 50000 units CAPS TK ONE C PO ONCE A WK  3  . fluticasone (FLONASE) 50 MCG/ACT nasal spray instill 2 sprays into each nostril once daily (Patient taking differently: instill 2 sprays into each nostril as needed) 48 g 3  . Homeopathic Products (LEG CRAMP RELIEF PO) Take 1 tablet by mouth daily.     Marland Kitchen MAGNESIUM-OXIDE 400 (241.3 Mg) MG tablet TK 1 T PO QD  3  . nicotine (NICODERM CQ - DOSED IN MG/24 HOURS) 14 mg/24hr patch Place 1 patch (14 mg total) onto the skin daily. 28 patch 0  . omeprazole (PRILOSEC) 20 MG capsule Take 1 capsule (20 mg total) by mouth daily. 90 capsule 3  . losartan (COZAAR) 25 MG tablet Take 1 tablet daily. (Patient not taking: Reported on 09/18/2017) 90 tablet 3  . omega-3 acid ethyl esters (LOVAZA) 1 g capsule Take 2 capsules (2 g total) by mouth 2 (two) times daily. (Patient not taking: Reported on 09/18/2017) 60 capsule 2  . oxybutynin (DITROPAN-XL) 5 MG 24 hr tablet TK 1 T PO ONCE  D  3  . phenazopyridine (PYRIDIUM) 200 MG tablet Take 1 tablet (200 mg total) by mouth 3 (three) times daily as needed for pain. (Patient not taking: Reported on 06/28/2017) 20 tablet 0   No current facility-administered medications for this visit.       REVIEW OF SYSTEMS (Negative unless checked)  Constitutional: '[]' Weight loss  '[]' Fever  '[]' Chills Cardiac: '[]' Chest pain   '[]' Chest pressure   '[]' Palpitations   '[]' Shortness of breath when laying flat   '[]' Shortness of breath at rest   '[]' Shortness of breath with exertion. Vascular:  '[x]' Pain in legs with walking   '[x]' Pain in legs at rest   '[]' Pain in legs when laying flat   '[x]' Claudication   '[]' Pain in feet when walking  '[]' Pain in feet at rest  '[]' Pain in feet when laying flat   '[]' History of DVT   '[]' Phlebitis   '[]' Swelling in legs   '[]' Varicose veins   '[]' Non-healing ulcers Pulmonary:   '[]' Uses home oxygen   '[]' Productive cough    '[]' Hemoptysis   '[]' Wheeze  '[]' COPD   '[]' Asthma Neurologic:  '[]' Dizziness  '[]' Blackouts   '[]' Seizures   '[]' History of stroke   '[x]' History of TIA  '[]' Aphasia   '[]' Temporary blindness   '[]' Dysphagia   '[]' Weakness or numbness in arms   '[]' Weakness or numbness in legs Musculoskeletal:  '[x]' Arthritis   '[]' Joint swelling   '[x]' Joint pain   '[x]' Low back pain Hematologic:  '[]' Easy bruising  '[]' Easy bleeding   '[]' Hypercoagulable state   '[]' Anemic  '[]' Hepatitis Gastrointestinal:  '[]' Blood in stool   '[]' Vomiting blood  '[]' Gastroesophageal reflux/heartburn   '[]' Abdominal pain Genitourinary:  '[]' Chronic kidney disease   '[]' Difficult urination  '[]' Frequent urination  '[]' Burning with urination   '[]' Hematuria Skin:  '[]' Rashes   '[]' Ulcers   '[]' Wounds Psychological:  '[]' History of anxiety   '[]'  History of major depression.    Physical Exam BP (!) 157/87 (BP Location: Right Arm, Patient Position: Sitting)   Pulse 81   Resp 16   Ht '5\' 3"'  (1.6 m)   Wt 162 lb (73.5 kg)   BMI 28.70 kg/m  Gen:  WD/WN, NAD Head: [place]/AT, No temporalis wasting.  Ear/Nose/Throat: Hearing grossly intact, nares w/o erythema or drainage, oropharynx w/o Erythema/Exudate Eyes: Conjunctiva clear, sclera non-icteric  Neck: trachea midline.  No JVD.  Pulmonary:  Good air movement, respirations not labored, no use of accessory muscles Cardiac: RRR Vascular:  Vessel Right Left  Radial Palpable Palpable                          PT  1+ palpable  1+ palpable  DP  1+ palpable Palpable   Gastrointestinal: soft, non-tender/non-distended.  Musculoskeletal: M/S 5/5 throughout.  Extremities without ischemic changes.  No deformity or atrophy.  1+ lower extremity edema. Neurologic: Sensation grossly intact in extremities.  Symmetrical.  Speech is fluent. Motor exam as listed above. Psychiatric: Judgment intact, Mood & affect appropriate for pt's clinical situation. Dermatologic: No rashes or ulcers noted.  No cellulitis or open wounds.  Radiology No results  found.  Labs Recent Results (from the past 2160 hour(s))  Glucose, capillary     Status: Abnormal   Collection Time: 06/28/17  4:09 PM  Result Value Ref Range   Glucose-Capillary 109 (H) 65 - 99 mg/dL  Protime-INR     Status: None   Collection Time: 06/28/17  4:15 PM  Result Value Ref Range   Prothrombin Time 12.8 11.4 - 15.2 seconds   INR 0.97  Comment: Performed at [facility], [address] [place], [place] [address]  APTT     Status: None   Collection Time: 06/28/17  4:15 PM  Result Value Ref Range   aPTT 25 24 - 36 seconds    Comment: Performed at [facility], [address] [place], [place] [address]  CBC     Status: None   Collection Time: 06/28/17  4:15 PM  Result Value Ref Range   WBC 7.7 3.6 - 11.0 K/uL   RBC 3.98 3.80 - 5.20 MIL/uL   Hemoglobin 12.5 12.0 - 16.0 g/dL   HCT 36.9 35.0 - 47.0 %   MCV 92.7 80.0 - 100.0 fL   MCH 31.3 26.0 - 34.0 pg   MCHC 33.8 32.0 - 36.0 g/dL   RDW 13.6 11.5 - 14.5 %   Platelets 217 150 - 440 K/uL    Comment: Performed at [facility], [address] [place], [place] [address]  Differential     Status: None   Collection Time: 06/28/17  4:15 PM  Result Value Ref Range   Neutrophils Relative % 53 %   Neutro Abs 4.2 1.4 - 6.5 K/uL   Lymphocytes Relative 35 %   Lymphs Abs 2.7 1.0 - 3.6 K/uL   Monocytes Relative 9 %   Monocytes Absolute 0.7 0.2 - 0.9 K/uL   Eosinophils Relative 2 %   Eosinophils Absolute 0.1 0 - 0.7 K/uL   Basophils Relative 1 %   Basophils Absolute 0.1 0 - 0.1 K/uL    Comment: Performed at [facility], [address] [place], [place] [address]  Comprehensive metabolic panel     Status: Abnormal   Collection Time: 06/28/17  4:15 PM  Result Value Ref Range   Sodium 137 135 - 145 mmol/L   Potassium 3.5 3.5 - 5.1 mmol/L   Chloride 104 101 - 111 mmol/L   CO2 22 22 - 32 mmol/L   Glucose, Bld 107 (H) 65 - 99 mg/dL   BUN 20 6 - 20 mg/dL   Creatinine, Ser 1.07 (H) 0.44  - 1.00 mg/dL   Calcium 9.0 8.9 - 10.3 mg/dL   Total Protein 8.1 6.5 - 8.1 g/dL   Albumin 4.4 3.5 - 5.0 g/dL   AST 21 15 - 41 U/L   ALT 16 14 - 54 U/L   Alkaline Phosphatase 143 (H) 38 - 126 U/L   Total Bilirubin 0.5 0.3 - 1.2 mg/dL   GFR calc non Af Amer 51 (L) >60 mL/min   GFR calc Af Amer 59 (L) >60 mL/min    Comment: (NOTE) The eGFR has been calculated using the CKD EPI equation. This calculation has not been validated in all clinical situations. eGFR's persistently <60 mL/min signify possible Chronic Kidney Disease.    Anion gap 11 5 - 15    Comment: Performed at [facility], [address] [place], [place] [address]  Troponin I     Status: None   Collection Time: 06/28/17  4:15 PM  Result Value Ref Range   Troponin I <0.03 <0.03 ng/mL    Comment: Performed at [facility], [address] [place],  [address]  Urine Drug Screen, Qualitative     Status: None   Collection Time: 06/28/17  4:15 PM  Result Value Ref Range   Tricyclic, Ur Screen NONE DETECTED NONE DETECTED   Amphetamines, Ur Screen NONE DETECTED NONE DETECTED   MDMA (Ecstasy)Ur Screen NONE DETECTED NONE DETECTED  Cocaine Metabolite,Ur [place] NONE DETECTED NONE DETECTED   Opiate, Ur Screen NONE DETECTED NONE DETECTED   Phencyclidine (PCP) Ur S NONE DETECTED NONE DETECTED   Cannabinoid 50 Ng, Ur [place] NONE DETECTED NONE DETECTED   Barbiturates, Ur Screen NONE DETECTED NONE DETECTED   Benzodiazepine, Ur Scrn NONE DETECTED NONE DETECTED   Methadone Scn, Ur NONE DETECTED NONE DETECTED    Comment: (NOTE) Tricyclics + metabolites, urine    Cutoff 1000 ng/mL Amphetamines + metabolites, urine  Cutoff 1000 ng/mL MDMA (Ecstasy), urine              Cutoff 500 ng/mL Cocaine Metabolite, urine          Cutoff 300 ng/mL Opiate + metabolites, urine        Cutoff 300 ng/mL Phencyclidine (PCP), urine         Cutoff 25 ng/mL Cannabinoid, urine                 Cutoff 50 ng/mL Barbiturates + metabolites,  urine  Cutoff 200 ng/mL Benzodiazepine, urine              Cutoff 200 ng/mL Methadone, urine                   Cutoff 300 ng/mL The urine drug screen provides only a preliminary, unconfirmed analytical test result and should not be used for non-medical purposes. Clinical consideration and professional judgment should be applied to any positive drug screen result due to possible interfering substances. A more specific alternate chemical method must be used in order to obtain a confirmed analytical result. Gas chromatography / mass spectrometry (GC/MS) is the preferred confirmat ory method. Performed at [facility], [address] [place], [place] [address]   Urinalysis, Routine w reflex microscopic     Status: Abnormal   Collection Time: 06/28/17  4:15 PM  Result Value Ref Range   Color, Urine STRAW (A) YELLOW   APPearance CLEAR (A) CLEAR   Specific Gravity, Urine 1.004 (L) 1.005 - 1.030   pH 6.0 5.0 - 8.0   Glucose, UA NEGATIVE NEGATIVE mg/dL   Hgb urine dipstick SMALL (A) NEGATIVE   Bilirubin Urine NEGATIVE NEGATIVE   Ketones, ur NEGATIVE NEGATIVE mg/dL   Protein, ur NEGATIVE NEGATIVE mg/dL   Nitrite NEGATIVE NEGATIVE   Leukocytes, UA NEGATIVE NEGATIVE   RBC / HPF 0-5 0 - 5 RBC/hpf   WBC, UA 0-5 0 - 5 WBC/hpf   Bacteria, UA NONE SEEN NONE SEEN   Squamous Epithelial / LPF NONE SEEN NONE SEEN    Comment: Performed at [facility], [address] [place], [place] [address]  Ethanol     Status: None   Collection Time: 06/28/17  4:15 PM  Result Value Ref Range   Alcohol, Ethyl (B) <10 <10 mg/dL    Comment:        LOWEST DETECTABLE LIMIT FOR SERUM ALCOHOL IS 10 mg/dL FOR MEDICAL PURPOSES ONLY Performed at [facility], [address] [place], [place] [address]   Hemoglobin A1c     Status: None   Collection Time: 06/28/17  4:15 PM  Result Value Ref Range   Hgb A1c MFr Bld 5.5 4.8 - 5.6 %    Comment: (NOTE) Pre diabetes:           5.7%-6.4% Diabetes:              >6.4% Glycemic control for   <7.0% adults with diabetes    Mean  Plasma Glucose 111.15 mg/dL    Comment: Performed at [facility], [address], [place], [place] [address]  Lipid panel     Status: Abnormal   Collection Time: 06/29/17  5:10 AM  Result Value Ref Range   Cholesterol 180 0 - 200 mg/dL   Triglycerides 176 (H) <150 mg/dL   HDL 36 (L) >40 mg/dL   Total CHOL/HDL Ratio 5.0 RATIO   VLDL 35 0 - 40 mg/dL   LDL Cholesterol 109 (H) 0 - 99 mg/dL    Comment:        Total Cholesterol/HDL:CHD Risk Coronary Heart Disease Risk Table                     Men   Women  1/2 Average Risk   3.4   3.3  Average Risk       5.0   4.4  2 X Average Risk   9.6   7.1  3 X Average Risk  23.4   11.0        Use the calculated Patient Ratio above and the CHD Risk Table to determine the patient's CHD Risk.        ATP III CLASSIFICATION (LDL):  <100     mg/dL   Optimal  100-129  mg/dL   Near or Above                    Optimal  130-159  mg/dL   Borderline  160-189  mg/dL   High  >190     mg/dL   Very High Performed at [facility], [address] [place], [place] [address]   ECHOCARDIOGRAM COMPLETE     Status: None   Collection Time: 06/29/17  9:48 AM  Result Value Ref Range   Weight 2,457.6 oz   Height 64 in   BP 143/62 mmHg    Assessment/Plan:  Essential hypertension blood pressure control important in reducing the progression of atherosclerotic disease. On appropriate oral medications.   PVD (peripheral vascular disease) ([place]) Has not been checked in a couple of years to my knowledge.  Does complain of lower extremity symptoms.  Continued tobacco abuse as as well as other atherosclerotic risk factors.  Certainly her disease could have progressed.  Recheck at her next visit  Smoking Represents an atherosclerotic risk factor.  No interest in quitting.  Carotid stenosis her carotid CT angiogram showed minimal carotid disease  bilaterally.  I independently reviewed this CT scan and I would estimate about a 25 to 30% left ICA stenosis and minimal recurrent stenosis in the right ICA. She should continue her aspirin and statin agent. The scan was several months ago so I will plan to do a duplex in 2 to 3 months in follow-up. Continue current medical therapy      Leotis Pain 09/18/2017, 2:45 PM   This note was created with Dragon medical transcription system.  Any errors from dictation are unintentional.

## 2017-09-18 NOTE — Assessment & Plan Note (Signed)
blood pressure control important in reducing the progression of atherosclerotic disease. On appropriate oral medications.  

## 2017-09-18 NOTE — Patient Instructions (Signed)
Peripheral Vascular Disease Peripheral vascular disease (PVD) is a disease of the blood vessels that are not part of your heart and brain. A simple term for PVD is poor circulation. In most cases, PVD narrows the blood vessels that carry blood from your heart to the rest of your body. This can result in a decreased supply of blood to your arms, legs, and internal organs, like your stomach or kidneys. However, it most often affects a person's lower legs and feet. There are two types of PVD.  Organic PVD. This is the more common type. It is caused by damage to the structure of blood vessels.  Functional PVD. This is caused by conditions that make blood vessels contract and tighten (spasm).  Without treatment, PVD tends to get worse over time. PVD can also lead to acute ischemic limb. This is when an arm or limb suddenly has trouble getting enough blood. This is a medical emergency. What are the causes? Each type of PVD has many different causes. The most common cause of PVD is buildup of a fatty material (plaque) inside of your arteries (atherosclerosis). Small amounts of plaque can break off from the walls of the blood vessels and become lodged in a smaller artery. This blocks blood flow and can cause acute ischemic limb. Other common causes of PVD include:  Blood clots that form inside of blood vessels.  Injuries to blood vessels.  Diseases that cause inflammation of blood vessels or cause blood vessel spasms.  Health behaviors and health history that increase your risk of developing PVD.  What increases the risk? You may have a greater risk of PVD if you:  Have a family history of PVD.  Have certain medical conditions, including: ? High cholesterol. ? Diabetes. ? High blood pressure (hypertension). ? Coronary heart disease. ? Past problems with blood clots. ? Past injury, such as burns or a broken bone. These may have damaged blood vessels in your limbs. ? Buerger disease. This is  caused by inflamed blood vessels in your hands and feet. ? Some forms of arthritis. ? Rare birth defects that affect the arteries in your legs.  Use tobacco.  Do not get enough exercise.  Are obese.  Are age 50 or older.  What are the signs or symptoms? PVD may cause many different symptoms. Your symptoms depend on what part of your body is not getting enough blood. Some common signs and symptoms include:  Cramps in your lower legs. This may be a symptom of poor leg circulation (claudication).  Pain and weakness in your legs while you are physically active that goes away when you rest (intermittent claudication).  Leg pain when at rest.  Leg numbness, tingling, or weakness.  Coldness in a leg or foot, especially when compared with the other leg.  Skin or hair changes. These can include: ? Hair loss. ? Shiny skin. ? Pale or bluish skin. ? Thick toenails.  Inability to get or maintain an erection (erectile dysfunction).  People with PVD are more prone to developing ulcers and sores on their toes, feet, or legs. These may take longer than normal to heal. How is this diagnosed? Your health care provider may diagnose PVD from your signs and symptoms. The health care provider will also do a physical exam. You may have tests to find out what is causing your PVD and determine its severity. Tests may include:  Blood pressure recordings from your arms and legs and measurements of the strength of your pulses (  pulse volume recordings).  Imaging studies using sound waves to take pictures of the blood flow through your blood vessels (Doppler ultrasound).  Injecting a dye into your blood vessels before having imaging studies using: ? X-rays (angiogram or arteriogram). ? Computer-generated X-rays (CT angiogram). ? A powerful electromagnetic field and a computer (magnetic resonance angiogram or MRA).  How is this treated? Treatment for PVD depends on the cause of your condition and the  severity of your symptoms. It also depends on your age. Underlying causes need to be treated and controlled. These include long-lasting (chronic) conditions, such as diabetes, high cholesterol, and high blood pressure. You may need to first try making lifestyle changes and taking medicines. Surgery may be needed if these do not work. Lifestyle changes may include:  Quitting smoking.  Exercising regularly.  Following a low-fat, low-cholesterol diet.  Medicines may include:  Blood thinners to prevent blood clots.  Medicines to improve blood flow.  Medicines to improve your blood cholesterol levels.  Surgical procedures may include:  A procedure that uses an inflated balloon to open a blocked artery and improve blood flow (angioplasty).  A procedure to put in a tube (stent) to keep a blocked artery open (stent implant).  Surgery to reroute blood flow around a blocked artery (peripheral bypass surgery).  Surgery to remove dead tissue from an infected wound on the affected limb.  Amputation. This is surgical removal of the affected limb. This may be necessary in cases of acute ischemic limb that are not improved through medical or surgical treatments.  Follow these instructions at home:  Take medicines only as directed by your health care provider.  Do not use any tobacco products, including cigarettes, chewing tobacco, or electronic cigarettes. If you need help quitting, ask your health care provider.  Lose weight if you are overweight, and maintain a healthy weight as directed by your health care provider.  Eat a diet that is low in fat and cholesterol. If you need help, ask your health care provider.  Exercise regularly. Ask your health care provider to suggest some good activities for you.  Use compression stockings or other mechanical devices as directed by your health care provider.  Take good care of your feet. ? Wear comfortable shoes that fit well. ? Check your feet  often for any cuts or sores. Contact a health care provider if:  You have cramps in your legs while walking.  You have leg pain when you are at rest.  You have coldness in a leg or foot.  Your skin changes.  You have erectile dysfunction.  You have cuts or sores on your feet that are not healing. Get help right away if:  Your arm or leg turns cold and blue.  Your arms or legs become red, warm, swollen, painful, or numb.  You have chest pain or trouble breathing.  You suddenly have weakness in your face, arm, or leg.  You become very confused or lose the ability to speak.  You suddenly have a very bad headache or lose your vision. This information is not intended to replace advice given to you by your health care provider. Make sure you discuss any questions you have with your health care provider. Document Released: 05/11/2004 Document Revised: 09/09/2015 Document Reviewed: 09/11/2013 Elsevier Interactive Patient Education  2017 [employer] Inc[employer]  

## 2017-09-18 NOTE — Assessment & Plan Note (Signed)
her carotid CT angiogram showed minimal carotid disease bilaterally.  I independently reviewed this CT scan and I would estimate about a 25 to 30% left ICA stenosis and minimal recurrent stenosis in the right ICA. She should continue her aspirin and statin agent. The scan was several months ago so I will plan to do a duplex in 2 to 3 months in follow-up. Continue current medical therapy

## 2017-09-18 NOTE — Assessment & Plan Note (Signed)
Has not been checked in a couple of years to my knowledge.  Does complain of lower extremity symptoms.  Continued tobacco abuse as as well as other atherosclerotic risk factors.  Certainly her disease could have progressed.  Recheck at her next visit

## 2017-10-10 ENCOUNTER — Encounter (INDEPENDENT_AMBULATORY_CARE_PROVIDER_SITE_OTHER): Payer: Self-pay

## 2017-12-12 ENCOUNTER — Encounter (INDEPENDENT_AMBULATORY_CARE_PROVIDER_SITE_OTHER): Payer: Self-pay | Admitting: Vascular Surgery

## 2017-12-12 ENCOUNTER — Ambulatory Visit (INDEPENDENT_AMBULATORY_CARE_PROVIDER_SITE_OTHER): Payer: Medicare Other

## 2017-12-12 ENCOUNTER — Encounter (INDEPENDENT_AMBULATORY_CARE_PROVIDER_SITE_OTHER): Payer: Self-pay

## 2017-12-12 ENCOUNTER — Ambulatory Visit (INDEPENDENT_AMBULATORY_CARE_PROVIDER_SITE_OTHER): Payer: Medicare Other | Admitting: Vascular Surgery

## 2017-12-12 VITALS — BP 129/70 | HR 65 | Resp 16 | Ht 63.0 in | Wt 154.8 lb

## 2017-12-12 DIAGNOSIS — I739 Peripheral vascular disease, unspecified: Secondary | ICD-10-CM

## 2017-12-12 DIAGNOSIS — I6523 Occlusion and stenosis of bilateral carotid arteries: Secondary | ICD-10-CM

## 2017-12-12 DIAGNOSIS — IMO0001 Reserved for inherently not codable concepts without codable children: Secondary | ICD-10-CM

## 2017-12-12 DIAGNOSIS — F17200 Nicotine dependence, unspecified, uncomplicated: Secondary | ICD-10-CM

## 2017-12-12 NOTE — Progress Notes (Signed)
Subjective:    Patient ID: Ashley Lynch, female    DOB: [birth year], 72 y.o.   MRN: [id number] Chief Complaint  Patient presents with  . Follow-up    2-48month ultrasound   Patient presents to review vascular studies.  She was last seen in June 2019 for evaluation of carotid artery stenosis and peripheral vascular disease.  The patient presents today with progressively worsening claudication-like symptoms to the thigh and calf bilaterally.  The patient has noticed a shortening in her claudication distance.  The patient notes that her right lower extremity symptoms are worse when compared to the left.  The patient feels that her discomfort has progressed to the point that she is unable to function on a daily basis and they have become lifestyle limiting.  The patient underwent an ABI which was notable for decrease in arterial perfusion to the previous ABI.  Today's ABI - Right: 0.63, biphasic tibial waveforms (previous 0.77) and Left: 0.73, a phasic tibial waveforms (previous 0.97).  The patient denies any rest pain or ulceration to the bilateral lower extremity.  The patient also underwent a bilateral carotid duplex which was notable for right ICA stenosis of 1 to 39% and left ICA stenosis of 40 to 59%.  Bilateral vertebral arteries demonstrate antegrade flow.  The patient denies any amaurosis fugax, TIA like symptoms or neurological deficits.  The patient denies any fever, nausea vomiting.  Review of Systems  Constitutional: Negative.   HENT: Negative.   Eyes: Negative.   Respiratory: Negative.   Cardiovascular:       Carotid artery stenosis Peripheral vascular disease  Gastrointestinal: Negative.   Endocrine: Negative.   Genitourinary: Negative.   Musculoskeletal: Negative.   Skin: Negative.   Allergic/Immunologic: Negative.   Neurological: Negative.   Hematological: Negative.   Psychiatric/Behavioral: Negative.       Objective:   Physical Exam  Constitutional: She is oriented to  person, place, and time. She appears well-developed and well-nourished. No distress.  HENT:  Head: Normocephalic and atraumatic.  Right Ear: External ear normal.  Left Ear: External ear normal.  Eyes: Pupils are equal, round, and reactive to light. Conjunctivae and EOM are normal.  Neck: Normal range of motion.  Mild bilateral bruits noted  Cardiovascular: Normal rate, regular rhythm, normal heart sounds and intact distal pulses.  Pulses:      Radial pulses are 2+ on the right side, and 2+ on the left side.  Hard to palpate pedal pulses however the bilateral feet are warm  Pulmonary/Chest: Effort normal and breath sounds normal.  Musculoskeletal: Normal range of motion. She exhibits no edema.  Neurological: She is alert and oriented to person, place, and time.  Skin: Skin is warm and dry. She is not diaphoretic.  Psychiatric: She has a normal mood and affect. Her behavior is normal. Judgment and thought content normal.  Vitals reviewed.  BP 129/70 (BP Location: Left Arm)   Pulse 65   Resp 16   Ht 5\' 3"  (1.6 m)   Wt 154 lb 12.8 oz (70.2 kg)   BMI 27.42 kg/m   Past Medical History:  Diagnosis Date  . Cancer ([place])    lymphoma  . History of chicken pox   . History of measles, mumps, or rubella   . HTN (hypertension)   . Lymphoma in remission [facility])    Social History   Socioeconomic History  . Marital status: Widowed    Spouse name: Not on file  . Number of children: Not  on file  . Years of education: Not on file  . Highest education level: Not on file  Occupational History  . Not on file  Social Needs  . Financial resource strain: Not on file  . Food insecurity:    Worry: Not on file    Inability: Not on file  . Transportation needs:    Medical: Not on file    Non-medical: Not on file  Tobacco Use  . Smoking status: Current Every Day Smoker    Packs/day: 1.00    Years: 52.00    Pack years: 52.00    Types: Cigarettes  . Smokeless tobacco: Current User  Substance  and Sexual Activity  . Alcohol use: No  . Drug use: No  . Sexual activity: Not on file  Lifestyle  . Physical activity:    Days per week: Not on file    Minutes per session: Not on file  . Stress: Not on file  Relationships  . Social connections:    Talks on phone: Not on file    Gets together: Not on file    Attends religious service: Not on file    Active member of club or organization: Not on file    Attends meetings of clubs or organizations: Not on file    Relationship status: Not on file  . Intimate partner violence:    Fear of current or ex partner: Not on file    Emotionally abused: Not on file    Physically abused: Not on file    Forced sexual activity: Not on file  Other Topics Concern  . Not on file  Social History Narrative  . Not on file   Past Surgical History:  Procedure Laterality Date  . ABDOMINAL HYSTERECTOMY  2013  . [place]  . TONSILLECTOMY  2010   Family History  Problem Relation Age of Onset  . Diabetes Mother        sibling and grandparent also   No Known Allergies     Assessment & Plan:  Patient presents to review vascular studies.  She was last seen in June 2019 for evaluation of carotid artery stenosis and peripheral vascular disease.  The patient presents today with progressively worsening claudication-like symptoms to the thigh and calf bilaterally.  The patient has noticed a shortening in her claudication distance.  The patient notes that her right lower extremity symptoms are worse when compared to the left.  The patient feels that her discomfort has progressed to the point that she is unable to function on a daily basis and they have become lifestyle limiting.  The patient underwent an ABI which was notable for decrease in arterial perfusion to the previous ABI.  Today's ABI - Right: 0.63, biphasic tibial waveforms (previous 0.77) and Left: 0.73, a phasic tibial waveforms (previous 0.97).  The patient denies any rest pain  or ulceration to the bilateral lower extremity.  The patient also underwent a bilateral carotid duplex which was notable for right ICA stenosis of 1 to 39% and left ICA stenosis of 40 to 59%.  Bilateral vertebral arteries demonstrate antegrade flow.  The patient denies any amaurosis fugax, TIA like symptoms or neurological deficits.  The patient denies any fever, nausea vomiting.  1. PVD (peripheral vascular disease) ([place]) - Stable Patient with a known past medical history of peripheral artery disease Patient presents today with progressively worsening claudication-like symptoms to the thighs and calves bilaterally. The symptoms have become significant and lifestyle limiting  Patient with a decrease of arterial perfusion on ABI Recommend a right lower extremity angiogram with possible intervention followed by a left lower extremity angiogram with possible intervention in an attempt to assess the patient's anatomy and contributing degree of peripheral artery disease.  If appropriate at that time an attempt to revascularize the lower extremity can be made. Procedure, risks and benefits explained to the patient Questions answered Patient wishes to proceed  2. Bilateral carotid artery stenosis - Stable Studies reviewed with patient. Patient asymptomatic with stable duplex.  No intervention at this time. Patient to return in one year for surveillance carotid duplex. I have discussed with the patient at length the risk factors for and pathogenesis of atherosclerotic disease and encouraged a healthy diet, regular exercise regimen and blood pressure / glucose control.  Patient was instructed to contact our office in the interim with problems such as arm / leg weakness or numbness, speech / swallowing difficulty or temporary monocular blindness. The patient expresses their understanding.  - VAS US CAROTID; Future  3. Smoking - Stable We had a discussion for approximately five minutes regarding the  absolute need for smoking cessation due to the deleterious nature of tobacco on the vascular system. We discussed the tobacco use would diminish patency of any intervention, and likely significantly worsen progressio of disease. We discussed multiple agents for quitting including replacement therapy or medications to reduce cravings such as Chantix. The patient voices their understanding of the importance of smoking cessation.  Current Outpatient Medications on File Prior to Visit  Medication Sig Dispense Refill  . amLODipine (NORVASC) 5 MG tablet Take 1 tablet each morning 30 tablet 12  . aspirin 81 MG tablet Take 81 mg by mouth daily.    Marland Kitchen atorvastatin (LIPITOR) 10 MG tablet Take 10 mg by mouth daily.    . cholecalciferol (VITAMIN D) 1000 units tablet Take 1,000 Units by mouth daily.    . fluticasone (FLONASE) 50 MCG/ACT nasal spray instill 2 sprays into each nostril once daily (Patient taking differently: instill 2 sprays into each nostril as needed) 48 g 3  . Homeopathic Products (LEG CRAMP RELIEF PO) Take 1 tablet by mouth daily.     Marland Kitchen MAGNESIUM-OXIDE 400 (241.3 Mg) MG tablet TK 1 T PO QD  3  . omeprazole (PRILOSEC) 20 MG capsule Take 1 capsule (20 mg total) by mouth daily. 90 capsule 3  . oxybutynin (DITROPAN-XL) 5 MG 24 hr tablet TK 1 T PO ONCE D  3  . vitamin B-12 (CYANOCOBALAMIN) 1000 MCG tablet Take 1,000 mcg by mouth daily.    . Cholecalciferol (VITAMIN D3) 50000 units CAPS TK ONE C PO ONCE A WK  3  . losartan (COZAAR) 25 MG tablet Take 1 tablet daily. (Patient not taking: Reported on 09/18/2017) 90 tablet 3  . nicotine (NICODERM CQ - DOSED IN MG/24 HOURS) 14 mg/24hr patch Place 1 patch (14 mg total) onto the skin daily. (Patient not taking: Reported on 12/12/2017) 28 patch 0  . omega-3 acid ethyl esters (LOVAZA) 1 g capsule Take 2 capsules (2 g total) by mouth 2 (two) times daily. (Patient not taking: Reported on 09/18/2017) 60 capsule 2  . phenazopyridine (PYRIDIUM) 200 MG tablet Take 1  tablet (200 mg total) by mouth 3 (three) times daily as needed for pain. (Patient not taking: Reported on 06/28/2017) 20 tablet 0   No current facility-administered medications on file prior to visit.    There are no Patient Instructions on file for this visit.  No follow-ups on file.   A , PA-C

## 2017-12-21 ENCOUNTER — Inpatient Hospital Stay: Admission: RE | Admit: 2017-12-21 | Payer: Medicare Other | Source: Ambulatory Visit

## 2017-12-24 ENCOUNTER — Ambulatory Visit: Admit: 2017-12-24 | Payer: Medicare Other | Admitting: Vascular Surgery

## 2017-12-24 SURGERY — LOWER EXTREMITY ANGIOGRAPHY
Anesthesia: Moderate Sedation | Laterality: Right

## 2017-12-31 ENCOUNTER — Ambulatory Visit: Admit: 2017-12-31 | Payer: Medicare Other | Admitting: Vascular Surgery

## 2017-12-31 SURGERY — LOWER EXTREMITY ANGIOGRAPHY
Anesthesia: Moderate Sedation | Laterality: Left

## 2018-11-15 ENCOUNTER — Other Ambulatory Visit: Payer: Self-pay

## 2018-11-15 ENCOUNTER — Ambulatory Visit (INDEPENDENT_AMBULATORY_CARE_PROVIDER_SITE_OTHER): Payer: Medicare Other | Admitting: Vascular Surgery

## 2018-11-15 VITALS — BP 165/73 | HR 71 | Resp 16 | Wt 143.0 lb

## 2018-11-15 DIAGNOSIS — M792 Neuralgia and neuritis, unspecified: Secondary | ICD-10-CM | POA: Diagnosis not present

## 2018-11-15 DIAGNOSIS — I6523 Occlusion and stenosis of bilateral carotid arteries: Secondary | ICD-10-CM

## 2018-11-15 DIAGNOSIS — I70213 Atherosclerosis of native arteries of extremities with intermittent claudication, bilateral legs: Secondary | ICD-10-CM

## 2018-11-15 DIAGNOSIS — I10 Essential (primary) hypertension: Secondary | ICD-10-CM

## 2018-11-15 DIAGNOSIS — I70219 Atherosclerosis of native arteries of extremities with intermittent claudication, unspecified extremity: Secondary | ICD-10-CM | POA: Insufficient documentation

## 2018-11-15 NOTE — Progress Notes (Signed)
MRN : [id number]  Ashley Lynch is a 73 y.o. (02-Nov-1945) female who presents with chief complaint of  Chief Complaint  Patient presents with  . Follow-up    discuss surgery  .  History of Present Illness: Patient returns today in follow up of her PAD.  Her symptoms have gradually gotten worse since she was last seen towards the end of last year.  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  The pain is now waking her up at night.  Both legs are affected.  She reports no open ulcerations or infection.  She had moderate reduction in her ABIs at last check.  Current Outpatient Medications  Medication Sig Dispense Refill  . amLODipine (NORVASC) 5 MG tablet Take 1 tablet each morning 30 tablet 12  . aspirin 81 MG tablet Take 81 mg by mouth daily.    . cholecalciferol (VITAMIN D) 1000 units tablet Take 1,000 Units by mouth daily.    . fluticasone (FLONASE) 50 MCG/ACT nasal spray instill 2 sprays into each nostril once daily (Patient taking differently: instill 2 sprays into each nostril as needed) 48 g 3  . Homeopathic Products (LEG CRAMP RELIEF PO) Take 1 tablet by mouth daily.     Marland Kitchen MAGNESIUM-OXIDE 400 (241.3 Mg) MG tablet TK 1 T PO QD  3  . omeprazole (PRILOSEC) 20 MG capsule Take 1 capsule (20 mg total) by mouth daily. 90 capsule 3  . vitamin B-12 (CYANOCOBALAMIN) 1000 MCG tablet Take 1,000 mcg by mouth daily.    Marland Kitchen atorvastatin (LIPITOR) 10 MG tablet Take 10 mg by mouth daily.    . Cholecalciferol (VITAMIN D3) 50000 units CAPS TK ONE C PO ONCE A WK  3  . losartan (COZAAR) 25 MG tablet Take 1 tablet daily. (Patient not taking: Reported on 09/18/2017) 90 tablet 3  . nicotine (NICODERM CQ - DOSED IN MG/24 HOURS) 14 mg/24hr patch Place 1 patch (14 mg total) onto the skin daily. (Patient not taking: Reported on 12/12/2017) 28 patch 0  . omega-3 acid ethyl esters (LOVAZA) 1 g capsule Take 2  capsules (2 g total) by mouth 2 (two) times daily. (Patient not taking: Reported on 09/18/2017) 60 capsule 2  . oxybutynin (DITROPAN-XL) 5 MG 24 hr tablet TK 1 T PO ONCE D  3   No current facility-administered medications for this visit.     Past Medical History:  Diagnosis Date  . Cancer ([place])    lymphoma  . History of chicken pox   . History of measles, mumps, or rubella   . HTN (hypertension)   . Lymphoma in remission [facility])     Past Surgical History:  Procedure Laterality Date  . ABDOMINAL HYSTERECTOMY  2013  . [place]  . TONSILLECTOMY  2010    Social History Social History   Tobacco Use  . Smoking status: Current Every Day Smoker    Packs/day: 1.00    Years: 52.00    Pack years: 52.00    Types: Cigarettes  . Smokeless tobacco: Current User  Substance Use Topics  . Alcohol use: No  . Drug use: No     Family History Family History  Problem Relation Age of Onset  . Diabetes Mother        sibling and grandparent also  no bleeding or clotting disorders No aneurysms  No Known  Allergies  REVIEW OF SYSTEMS (Negative unless checked)  Constitutional: [] ?Weight loss  [] ?Fever  [] ?Chills Cardiac: [] ?Chest pain   [] ?Chest pressure   [] ?Palpitations   [] ?Shortness of breath when laying flat   [] ?Shortness of breath at rest   [] ?Shortness of breath with exertion. Vascular:  [x] ?Pain in legs with walking   [x] ?Pain in legs at rest   [] ?Pain in legs when laying flat   [x] ?Claudication   [] ?Pain in feet when walking  [] ?Pain in feet at rest  [] ?Pain in feet when laying flat   [] ?History of DVT   [] ?Phlebitis   [] ?Swelling in legs   [] ?Varicose veins   [] ?Non-healing ulcers Pulmonary:   [] ?Uses home oxygen   [] ?Productive cough   [] ?Hemoptysis   [] ?Wheeze  [] ?COPD   [] ?Asthma Neurologic:  [] ?Dizziness  [] ?Blackouts   [] ?Seizures   [] ?History of stroke   [x] ?History of TIA  [] ?Aphasia   [] ?Temporary blindness   [] ?Dysphagia   [] ?Weakness or numbness in  arms   [] ?Weakness or numbness in legs Musculoskeletal:  [x] ?Arthritis   [] ?Joint swelling   [x] ?Joint pain   [x] ?Low back pain Hematologic:  [] ?Easy bruising  [] ?Easy bleeding   [] ?Hypercoagulable state   [] ?Anemic  [] ?Hepatitis Gastrointestinal:  [] ?Blood in stool   [] ?Vomiting blood  [] ?Gastroesophageal reflux/heartburn   [] ?Abdominal pain Genitourinary:  [] ?Chronic kidney disease   [] ?Difficult urination  [] ?Frequent urination  [] ?Burning with urination   [] ?Hematuria Skin:  [] ?Rashes   [] ?Ulcers   [] ?Wounds Psychological:  [] ?History of anxiety   [] ? History of major depression.  Physical Examination  BP (!) 165/73 (BP Location: Right Arm)   Pulse 71   Resp 16   Wt 143 lb (64.9 kg)   BMI 25.33 kg/m  Gen:  WD/WN, NAD Head: [place]/AT, No temporalis wasting. Ear/Nose/Throat: Hearing grossly intact, nares w/o erythema or drainage Eyes: Conjunctiva clear. Sclera non-icteric Neck: Supple.  Trachea midline Pulmonary:  Good air movement, no use of accessory muscles.  Cardiac: RRR, no JVD Vascular:  Vessel Right Left  Radial Palpable Palpable                        Musculoskeletal: M/S 5/5 throughout.  No deformity or atrophy.  Decent capillary refill.  Trace lower extremity edema. Neurologic: Sensation grossly intact in extremities.  Symmetrical.  Speech is fluent.  Psychiatric: Judgment intact, Mood & affect appropriate for pt's clinical situation. Dermatologic: No rashes or ulcers noted.  No cellulitis or open wounds.       Labs No results found for this or any previous visit (from the past 2160 hour(s)).  Radiology No results found.  Assessment/Plan  Essential hypertension blood pressure control important in reducing the progression of atherosclerotic disease. On appropriate oral medications.   Carotid stenosis To be checked later this year.  Neuropathic pain, leg The patient has a combination of neuropathic symptoms and symptoms from peripheral arterial disease.   We have discussed that we can help potentially her arterial insufficiency symptoms, but her nerve symptoms are not going to be something that I can improve upon.  Atherosclerosis of native arteries of extremity with intermittent claudication ([place]) The patient has significant claudication symptoms bilaterally.  She may have a component of rest pain as well although some of her symptoms may be neuropathic complicating the situation.  We had a good long discussion today about the natural history and pathophysiology of peripheral arterial disease.  She is not at any immediate risk of limb loss.  Her  symptoms have deteriorated and she is now considering intervention, but still would need to do this several months in the future.  She has a lot of things going on particularly with her brother with dementia.  I told her that was certainly reasonable.  We could perform an angiogram of each lower extremity but that intervention would be done in a staged fashion requiring 2 procedures.  I discussed how the procedure should be performed.  At this time she is leaning towards having this done later in the year, and she will contact our office to schedule if she decides to have it done.    Leotis Pain, MD  11/15/2018 12:23 PM    This note was created with Dragon medical transcription system.  Any errors from dictation are purely unintentional

## 2018-11-15 NOTE — Patient Instructions (Signed)
Peripheral Vascular Disease  Peripheral vascular disease (PVD) is a disease of the blood vessels that are not part of your heart and brain. A simple term for PVD is poor circulation. In most cases, PVD narrows the blood vessels that carry blood from your heart to the rest of your body. This can reduce the supply of blood to your arms, legs, and internal organs, like your stomach or kidneys. However, PVD most often affects a person's lower legs and feet. Without treatment, PVD tends to get worse. PVD can also lead to acute ischemic limb. This is when an arm or leg suddenly cannot get enough blood. This is a medical emergency. Follow these instructions at home: Lifestyle  Do not use any products that contain nicotine or tobacco, such as cigarettes and e-cigarettes. If you need help quitting, ask your doctor.  Lose weight if you are overweight. Or, stay at a healthy weight as told by your doctor.  Eat a diet that is low in fat and cholesterol. If you need help, ask your doctor.  Exercise regularly. Ask your doctor for activities that are right for you. General instructions  Take over-the-counter and prescription medicines only as told by your doctor.  Take good care of your feet: ? Wear comfortable shoes that fit well. ? Check your feet often for any cuts or sores.  Keep all follow-up visits as told by your doctor This is important. Contact a doctor if:  You have cramps in your legs when you walk.  You have leg pain when you are at rest.  You have coldness in a leg or foot.  Your skin changes.  You are unable to get or have an erection (erectile dysfunction).  You have cuts or sores on your feet that do not heal. Get help right away if:  Your arm or leg turns cold, numb, and blue.  Your arms or legs become red, warm, swollen, painful, or numb.  You have chest pain.  You have trouble breathing.  You suddenly have weakness in your face, arm, or leg.  You become very  confused or you cannot speak.  You suddenly have a very bad headache.  You suddenly cannot see. Summary  Peripheral vascular disease (PVD) is a disease of the blood vessels.  A simple term for PVD is poor circulation. Without treatment, PVD tends to get worse.  Treatment may include exercise, low fat and low cholesterol diet, and quitting smoking. This information is not intended to replace advice given to you by your health care provider. Make sure you discuss any questions you have with your health care provider. Document Released: 06/28/2009 Document Revised: 03/16/2017 Document Reviewed: 05/11/2016 Elsevier Patient Education  2020 [employer]  

## 2018-11-15 NOTE — Assessment & Plan Note (Signed)
blood pressure control important in reducing the progression of atherosclerotic disease. On appropriate oral medications.  

## 2018-11-15 NOTE — Assessment & Plan Note (Signed)
The patient has a combination of neuropathic symptoms and symptoms from peripheral arterial disease.  We have discussed that we can help potentially her arterial insufficiency symptoms, but her nerve symptoms are not going to be something that I can improve upon.

## 2018-11-15 NOTE — Assessment & Plan Note (Signed)
The patient has significant claudication symptoms bilaterally.  She may have a component of rest pain as well although some of her symptoms may be neuropathic complicating the situation.  We had a good long discussion today about the natural history and pathophysiology of peripheral arterial disease.  She is not at any immediate risk of limb loss.  Her symptoms have deteriorated and she is now considering intervention, but still would need to do this several months in the future.  She has a lot of things going on particularly with her brother with dementia.  I told her that was certainly reasonable.  We could perform an angiogram of each lower extremity but that intervention would be done in a staged fashion requiring 2 procedures.  I discussed how the procedure should be performed.  At this time she is leaning towards having this done later in the year, and she will contact our office to schedule if she decides to have it done.

## 2018-11-15 NOTE — Assessment & Plan Note (Signed)
To be checked later this year 

## 2018-11-21 ENCOUNTER — Other Ambulatory Visit: Payer: Self-pay | Admitting: Internal Medicine

## 2018-11-21 DIAGNOSIS — Z1231 Encounter for screening mammogram for malignant neoplasm of breast: Secondary | ICD-10-CM

## 2019-09-02 ENCOUNTER — Other Ambulatory Visit: Payer: Self-pay | Admitting: Internal Medicine

## 2019-09-02 DIAGNOSIS — R748 Abnormal levels of other serum enzymes: Secondary | ICD-10-CM

## 2019-09-11 ENCOUNTER — Other Ambulatory Visit: Payer: Self-pay

## 2019-09-11 ENCOUNTER — Ambulatory Visit: Payer: Medicare Other

## 2019-09-11 ENCOUNTER — Ambulatory Visit
Admission: RE | Admit: 2019-09-11 | Discharge: 2019-09-11 | Disposition: A | Discharge status: Home / Self Care | Payer: Medicare PPO | Source: Ambulatory Visit | Attending: Internal Medicine | Admitting: Internal Medicine

## 2019-09-11 DIAGNOSIS — R748 Abnormal levels of other serum enzymes: Secondary | ICD-10-CM | POA: Diagnosis present

## 2019-10-07 ENCOUNTER — Other Ambulatory Visit: Payer: Self-pay | Admitting: Internal Medicine

## 2019-10-07 DIAGNOSIS — Z1231 Encounter for screening mammogram for malignant neoplasm of breast: Secondary | ICD-10-CM

## 2019-10-20 ENCOUNTER — Other Ambulatory Visit: Payer: Self-pay

## 2019-10-20 ENCOUNTER — Emergency Department
Admission: EM | Admit: 2019-10-20 | Discharge: 2019-10-20 | Disposition: A | Discharge status: Home / Self Care | Payer: Medicare PPO | Attending: Emergency Medicine | Admitting: Emergency Medicine

## 2019-10-20 ENCOUNTER — Encounter: Payer: Self-pay | Admitting: Emergency Medicine

## 2019-10-20 ENCOUNTER — Emergency Department: Payer: Medicare PPO

## 2019-10-20 DIAGNOSIS — Y999 Unspecified external cause status: Secondary | ICD-10-CM | POA: Insufficient documentation

## 2019-10-20 DIAGNOSIS — Y929 Unspecified place or not applicable: Secondary | ICD-10-CM | POA: Diagnosis not present

## 2019-10-20 DIAGNOSIS — I10 Essential (primary) hypertension: Secondary | ICD-10-CM | POA: Diagnosis not present

## 2019-10-20 DIAGNOSIS — Z7982 Long term (current) use of aspirin: Secondary | ICD-10-CM | POA: Insufficient documentation

## 2019-10-20 DIAGNOSIS — R202 Paresthesia of skin: Secondary | ICD-10-CM | POA: Diagnosis not present

## 2019-10-20 DIAGNOSIS — Z85038 Personal history of other malignant neoplasm of large intestine: Secondary | ICD-10-CM | POA: Diagnosis not present

## 2019-10-20 DIAGNOSIS — S32030A Wedge compression fracture of third lumbar vertebra, initial encounter for closed fracture: Secondary | ICD-10-CM | POA: Insufficient documentation

## 2019-10-20 DIAGNOSIS — X111XXA Contact with running hot water, initial encounter: Secondary | ICD-10-CM | POA: Insufficient documentation

## 2019-10-20 DIAGNOSIS — S3992XA Unspecified injury of lower back, initial encounter: Secondary | ICD-10-CM | POA: Diagnosis present

## 2019-10-20 DIAGNOSIS — Y939 Activity, unspecified: Secondary | ICD-10-CM | POA: Diagnosis not present

## 2019-10-20 DIAGNOSIS — R200 Anesthesia of skin: Secondary | ICD-10-CM | POA: Insufficient documentation

## 2019-10-20 DIAGNOSIS — F17210 Nicotine dependence, cigarettes, uncomplicated: Secondary | ICD-10-CM | POA: Insufficient documentation

## 2019-10-20 DIAGNOSIS — Z79899 Other long term (current) drug therapy: Secondary | ICD-10-CM | POA: Insufficient documentation

## 2019-10-20 MED ORDER — ONDANSETRON 4 MG PO TBDP: 4.0000 mg | ORAL_TABLET | Freq: Three times a day (TID) | ORAL | 0 refills | Status: DC | PRN

## 2019-10-20 MED ORDER — METHOCARBAMOL 500 MG PO TABS: 500.0000 mg | ORAL_TABLET | Freq: Four times a day (QID) | ORAL | 0 refills | Status: AC

## 2019-10-20 MED ORDER — ONDANSETRON 4 MG PO TBDP
4.0000 mg | ORAL_TABLET | Freq: Once | ORAL | Status: AC
  Administered 2019-10-20: 4 mg via ORAL
  Filled 2019-10-20: qty 1

## 2019-10-20 MED ORDER — HYDROCODONE-ACETAMINOPHEN 5-325 MG PO TABS: 1.0000 | ORAL_TABLET | Freq: Three times a day (TID) | ORAL | 0 refills | Status: AC | PRN

## 2019-10-20 MED ORDER — OXYCODONE-ACETAMINOPHEN 5-325 MG PO TABS
1.0000 | ORAL_TABLET | Freq: Once | ORAL | Status: AC
  Administered 2019-10-20: 1 via ORAL
  Filled 2019-10-20: qty 1

## 2019-10-20 MED ORDER — LIDOCAINE 5 % EX PTCH: 1.0000 | MEDICATED_PATCH | CUTANEOUS | 0 refills | Status: AC

## 2019-10-20 MED ORDER — METHOCARBAMOL 500 MG PO TABS
500.0000 mg | ORAL_TABLET | Freq: Once | ORAL | Status: AC
  Administered 2019-10-20: 500 mg via ORAL
  Filled 2019-10-20: qty 1

## 2019-10-20 MED ORDER — KETOROLAC TROMETHAMINE 30 MG/ML IJ SOLN
30.0000 mg | Freq: Once | INTRAMUSCULAR | Status: AC
  Administered 2019-10-20: 30 mg via INTRAMUSCULAR
  Filled 2019-10-20: qty 1

## 2019-10-20 NOTE — ED Notes (Signed)
See triage note  States she tried to slide of something heavy  Developed pain to lower back  States pain is at mid back

## 2019-10-20 NOTE — ED Notes (Signed)
Patient walking around lobby.  This RN re-assessed patient.  Patient states she feels better when she stands.  This RN asked patient regarding her numbness.  Patient states, "It's just pain, it was never numb.  I don't know why I told them that."

## 2019-10-20 NOTE — ED Notes (Signed)
Patient transported to CT 

## 2019-10-20 NOTE — Discharge Instructions (Signed)
Your exam and CT scan reveal a new compression fracture at L3. This is stable and will be treated with pain medicine. You should follow-up with Dr. Humphrey Rolls for ongoing pain management. Return to the ED as needed.

## 2019-10-20 NOTE — ED Provider Notes (Signed)
[facility] Emergency Department Provider Note ____________________________________________  Time seen: 1918  I have reviewed the triage vital signs and the nursing notes.  HISTORY  Chief Complaint  Back Pain  HPI Ashley Lynch is a 74 y.o. female presents to the ED  EMS from home, for evaluation of acute low back pain.  Patient presents to the ED with sudden onset of low back pain with right leg numbness.  According to EMS report, the patient was attempting to move a hot water heater by herself, when she apparently heard a "crack" in her back.  She has since complained of severe back pain, worse with movement.  She also reports some numbness and tingling to the right upper leg.  Patient denies any bladder bowel incontinence, foot drop, or saddle anesthesia.  She also denies any fall or direct trauma to the back.  Patient with a history of lymphoma, hypertension, and degenerative disc disease, presents for acute back pain.  Past Medical History:  Diagnosis Date  . Cancer ([place])    lymphoma  . History of chicken pox   . History of measles, mumps, or rubella   . HTN (hypertension)   . Lymphoma in remission [facility])     Patient Active Problem List   Diagnosis Date Noted  . Atherosclerosis of native arteries of extremity with intermittent claudication ([place]) 11/15/2018  . Carotid stenosis 09/18/2017  . TIA (transient ischemic attack) 06/28/2017  . Chronic low back pain with left-sided sciatica 10/24/2016  . PVD (peripheral vascular disease) ([place]) 05/01/2016  . Right carotid bruit 05/01/2016  . Pedal edema 05/01/2016  . 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 Chest pain 11/01/2015  . Health maintenance examination 05/27/2015  . Depression 05/27/2015  . Colon cancer screening 05/27/2015  . Decreased hearing of left ear 05/27/2015  . Left leg swelling 05/27/2015  . Neuropathic pain, leg 01/01/2015  . Left leg pain 01/01/2015  . Essential  hypertension 12/23/2014  . Heartburn 11/18/2014  . Hypertension 11/18/2014  . Urinary frequency 11/18/2014  . Smoking 11/18/2014  . History of lymphoma 08/17/2014    Past Surgical History:  Procedure Laterality Date  . ABDOMINAL HYSTERECTOMY  2013  . [place]  . TONSILLECTOMY  2010    Prior to Admission medications   Medication Sig Start Date End Date Taking? Authorizing Provider  amLODipine (NORVASC) 5 MG tablet Take 1 tablet each morning 06/19/16   Arlis Porta., MD  aspirin 81 MG tablet Take 81 mg by mouth daily.    [provider]  atorvastatin (LIPITOR) 10 MG tablet Take 10 mg by mouth daily.    [provider]  cholecalciferol (VITAMIN D) 1000 units tablet Take 1,000 Units by mouth daily.    [provider]  Cholecalciferol (VITAMIN D3) 50000 units CAPS TK ONE C PO ONCE A WK 07/10/17   [provider]  fluticasone (FLONASE) 50 MCG/ACT nasal spray instill 2 sprays into each nostril once daily Patient taking differently: instill 2 sprays into each nostril as needed 01/11/16   Arlis Porta., MD  Homeopathic Products (LEG CRAMP RELIEF PO) Take 1 tablet by mouth daily.     [provider]  HYDROcodone-acetaminophen (NORCO) 5-325 MG tablet Take 1 tablet by mouth 3 (three) times daily as needed for up to 7 days. 10/20/19 10/27/19  , Dannielle Karvonen, PA-C  lidocaine (LIDODERM) 5 % Place 1 patch onto the skin daily for 10 days.  Remove & Discard patch within 24 hours or as directed by MD 10/20/19 10/30/19  , Dannielle Karvonen, PA-C  MAGNESIUM-OXIDE 400 (241.3 Mg) MG tablet TK 1 T PO QD 07/11/17   [provider]  methocarbamol (ROBAXIN) 500 MG tablet Take 1 tablet (500 mg total) by mouth 4 (four) times daily for 10 days. 10/20/19 10/30/19  , Dannielle Karvonen, PA-C  omeprazole (PRILOSEC) 20 MG capsule Take 1 capsule (20 mg total) by mouth daily. 11/27/16   Karamalegos, Devonne Doughty, DO  ondansetron  (ZOFRAN ODT) 4 MG disintegrating tablet Take 1 tablet (4 mg total) by mouth every 8 (eight) hours as needed. 10/20/19   , Dannielle Karvonen, PA-C  oxybutynin (DITROPAN-XL) 5 MG 24 hr tablet TK 1 T PO ONCE D 07/23/17   [provider]  vitamin B-12 (CYANOCOBALAMIN) 1000 MCG tablet Take 1,000 mcg by mouth daily.    [provider]    Allergies Patient has no known allergies.  Family History  Problem Relation Age of Onset  . Diabetes Mother        sibling and grandparent also    Social History Social History   Tobacco Use  . Smoking status: Current Every Day Smoker    Packs/day: 1.00    Years: 52.00    Pack years: 52.00    Types: Cigarettes  . Smokeless tobacco: Current User  Substance Use Topics  . Alcohol use: No  . Drug use: No    Review of Systems  Constitutional: Negative for fever. Cardiovascular: Negative for chest pain. Respiratory: Negative for shortness of breath. Gastrointestinal: Negative for abdominal pain, vomiting and diarrhea. Genitourinary: Negative for dysuria. Musculoskeletal: Positive for back pain. Skin: Negative for rash. Neurological: Negative for headaches, focal weakness or numbness. ____________________________________________  PHYSICAL EXAM:  VITAL SIGNS: ED Triage Vitals  Enc Vitals Group     BP 10/20/19 1736 (!) 150/71     Pulse Rate 10/20/19 1736 84     Resp 10/20/19 1736 20     Temp 10/20/19 1736 97.9 F (36.6 C)     Temp Source 10/20/19 1736 Oral     SpO2 10/20/19 1736 98 %     Weight 10/20/19 1641 135 lb (61.2 kg)     Height 10/20/19 1641 5\' 3"  (1.6 m)     Head Circumference --      Peak Flow --      Pain Score 10/20/19 1638 8     Pain Loc --      Pain Edu? --      Excl. in [place]? --     Constitutional: Alert and oriented. Well appearing and in no distress. Head: Normocephalic and atraumatic. Eyes: Conjunctivae are normal. Normal extraocular movements Cardiovascular: Normal rate, regular rhythm. Normal  distal pulses. Respiratory: Normal respiratory effort. No wheezes/rales/rhonchi. Gastrointestinal: Soft and nontender. No distention. Musculoskeletal: Normal spinal alignment with mild midline tenderness to palpation over the upper lumbar region.  Nontender with normal range of motion in all extremities.  Neurologic:  CN II-XII grossly intact. Normal gait without ataxia. Normal speech and language. No gross focal neurologic deficits are appreciated. Skin:  Skin is warm, dry and intact. No rash noted. Psychiatric: Mood and affect are normal. Patient exhibits appropriate insight and judgment. ____________________________________________   RADIOLOGY  CT Lumbar Spine w/o CM  IMPRESSION: 1. Acute compression fracture of the superior endplate of L3 with approximately 10% loss of vertebral body height and approximately 2 mm retropulsion of the posterosuperior endplate of L3.  No significant associated spinal canal stenosis. 2. Advanced osteopenia with multilevel degenerative changes. 3. 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 Recommend followup by ultrasound in 3 years. This recommendation follows ACR consensus guidelines: White Paper of the ACR Incidental Findings Committee II on Vascular Findings. J Am Coll Radiol 2013; 10:789-794. Aortic aneurysm NOS (ICD10-I71.9) 4. Aortic Atherosclerosis (ICD10-I70.0). ____________________________________________  PROCEDURES  Percocet 5-325 mg PO Procedures ____________________________________________  INITIAL IMPRESSION / ASSESSMENT AND PLAN / ED COURSE  DDX: HNP, lumbar radiculopathy, compression fracture, DDD  Geriatric patient with ED evaluation of acute low back pain with right lower extremity referral following mechanical injury.  Patient's exam is concerning for lumbar radiculopathy with an acute process.  CT exam reveals an acute L3 compression fracture with out retropulsion or spinal stenosis.  Patient is treated with  pain medications in the ED including Percocet, ketorolac, Robaxin, and Zofran.  Patient is discharged with directions to follow with a primary provider for ongoing pain management.  Return precautions have been reviewed.  GERAL TUCH was evaluated in Emergency Department on 10/20/2019 for the symptoms described in the history of present illness. She was evaluated in the context of the global COVID-19 pandemic, which necessitated consideration that the patient might be at risk for infection with the SARS-CoV-2 virus that causes COVID-19. Institutional protocols and algorithms that pertain to the evaluation of patients at risk for COVID-19 are in a state of rapid change based on information released by regulatory bodies including the CDC and federal and state organizations. These policies and algorithms were followed during the patient's care in the ED.  I reviewed the patient's prescription history over the last 12 months in the multi-state controlled substances database(s) that includes [place], [place], [place], [place], [place], [place], [place], [place], [place], [place], [place], [place], [place], and [place].  Results were notable for monthly Rx for Tramadol. ____________________________________________  FINAL CLINICAL IMPRESSION(S) / ED DIAGNOSES  Final diagnoses:  Compression fracture of L3 vertebra, initial encounter [facility])      , Dannielle Karvonen, PA-C 10/20/19 2326    Duffy Bruce, MD 10/23/19 2115

## 2019-10-20 NOTE — ED Triage Notes (Signed)
Patient presents to the ED via EMS for lower back pain and right leg numbness.  Per EMS patient was attempting to move a hot water heater by herself and she heard a "crack" in her back.  Patient is now complaining of severe back pain, worse with movement and some upper right leg numbness.

## 2019-10-29 ENCOUNTER — Emergency Department: Payer: Medicare PPO

## 2019-10-29 ENCOUNTER — Other Ambulatory Visit: Payer: Self-pay

## 2019-10-29 ENCOUNTER — Emergency Department
Admission: EM | Admit: 2019-10-29 | Discharge: 2019-10-29 | Disposition: A | Discharge status: Home / Self Care | Payer: Medicare PPO | Attending: Student in an Organized Health Care Education/Training Program | Admitting: Student in an Organized Health Care Education/Training Program

## 2019-10-29 DIAGNOSIS — K5903 Drug induced constipation: Secondary | ICD-10-CM | POA: Diagnosis not present

## 2019-10-29 DIAGNOSIS — Z8572 Personal history of non-Hodgkin lymphomas: Secondary | ICD-10-CM | POA: Insufficient documentation

## 2019-10-29 DIAGNOSIS — F17210 Nicotine dependence, cigarettes, uncomplicated: Secondary | ICD-10-CM | POA: Insufficient documentation

## 2019-10-29 DIAGNOSIS — I10 Essential (primary) hypertension: Secondary | ICD-10-CM | POA: Insufficient documentation

## 2019-10-29 DIAGNOSIS — Z79899 Other long term (current) drug therapy: Secondary | ICD-10-CM | POA: Insufficient documentation

## 2019-10-29 DIAGNOSIS — K5900 Constipation, unspecified: Secondary | ICD-10-CM | POA: Diagnosis present

## 2019-10-29 LAB — COMPREHENSIVE METABOLIC PANEL
ALT: 14 U/L (ref 0–44)
AST: 18 U/L (ref 15–41)
Albumin: 3.6 g/dL (ref 3.5–5.0)
Alkaline Phosphatase: 133 U/L — ABNORMAL HIGH (ref 38–126)
Anion gap: 11 (ref 5–15)
BUN: 21 mg/dL (ref 8–23)
CO2: 24 mmol/L (ref 22–32)
Calcium: 8.9 mg/dL (ref 8.9–10.3)
Chloride: 98 mmol/L (ref 98–111)
Creatinine, Ser: 1.17 mg/dL — ABNORMAL HIGH (ref 0.44–1.00)
GFR calc Af Amer: 54 mL/min — ABNORMAL LOW (ref >60)
GFR calc non Af Amer: 46 mL/min — ABNORMAL LOW (ref >60)
Glucose, Bld: 103 mg/dL — ABNORMAL HIGH (ref 70–99)
Potassium: 3.9 mmol/L (ref 3.5–5.1)
Sodium: 133 mmol/L — ABNORMAL LOW (ref 135–145)
Total Bilirubin: 0.5 mg/dL (ref 0.3–1.2)
Total Protein: 6.9 g/dL (ref 6.5–8.1)

## 2019-10-29 LAB — CBC
HCT: 32.3 % — ABNORMAL LOW (ref 36.0–46.0)
Hemoglobin: 11.5 g/dL — ABNORMAL LOW (ref 12.0–15.0)
MCH: 31.3 pg (ref 26.0–34.0)
MCHC: 35.6 g/dL (ref 30.0–36.0)
MCV: 87.8 fL (ref 80.0–100.0)
Platelets: 222 10*3/uL (ref 150–400)
RBC: 3.68 MIL/uL — ABNORMAL LOW (ref 3.87–5.11)
RDW: 13.9 % (ref 11.5–15.5)
WBC: 8.5 10*3/uL (ref 4.0–10.5)
nRBC: 0 % (ref 0.0–0.2)

## 2019-10-29 LAB — LIPASE, BLOOD: Lipase: 20 U/L (ref 11–51)

## 2019-10-29 MED ORDER — POLYETHYLENE GLYCOL 3350 17 G PO PACK: 17.0000 g | PACK | Freq: Every day | ORAL | 0 refills | Status: DC

## 2019-10-29 MED ORDER — SORBITOL 70 % SOLN
960.0000 mL | TOPICAL_OIL | Freq: Once | ORAL | Status: AC
  Administered 2019-10-29: 960 mL via RECTAL
  Filled 2019-10-29: qty 473

## 2019-10-29 MED ORDER — SODIUM CHLORIDE 0.9 % IV BOLUS
500.0000 mL | Freq: Once | INTRAVENOUS | Status: AC
  Administered 2019-10-29: 500 mL via INTRAVENOUS

## 2019-10-29 MED ORDER — LACTULOSE 10 GM/15ML PO SOLN
30.0000 g | Freq: Once | ORAL | Status: AC
  Administered 2019-10-29: 30 g via ORAL
  Filled 2019-10-29: qty 60

## 2019-10-29 NOTE — ED Notes (Signed)
RN has called pharmacy to check on medications. Pharmacist Abby is sending them time now.

## 2019-10-29 NOTE — ED Notes (Signed)
Patient discharged home with grandadughter, patient received discharge papers. Patient got belongings and verbalized he has received all of his belongings. Patient appropriate and cooperative. Vital signs taken. NAD noted.

## 2019-10-29 NOTE — ED Provider Notes (Signed)
-----------------------------------------   4:59 PM on 10/29/2019 -----------------------------------------  I took over care on this patient from Dr. Quentin Cornwall.  The patient reports that she had a small bowel movement after the smog enema and lactulose.  She states that she feels about the same.  I offered to observe her a bit longer and potentially give additional medication, however it would also be appropriate for her to be discharged home and continue with outpatient treatment.  The patient states that she would like to go home.  I gave the patient thorough return precautions and she expressed understanding.  She has been prescribed MiraLAX.   Arta Silence, MD 10/29/19 1701

## 2019-10-29 NOTE — ED Notes (Signed)
Lactulose has arrived from pharmacy, however RN is still waiting for SMOG enema. RN has requested assistance from pharmacy Tech to help attain medication.

## 2019-10-29 NOTE — ED Triage Notes (Signed)
Patient arrived via EMS from home. Patient is AOx4 with very limited mobility. Patient chief complaint is constipation. Patient stated she broke her back and has been very limited in mobility in addition to taking narcotic pain medication which all began July 5th per patient.

## 2019-10-29 NOTE — ED Provider Notes (Signed)
[facility] Emergency Department Provider Note    First MD Initiated Contact with Patient 10/29/19 1046     (approximate)  I have reviewed the triage vital signs and the nursing notes.   HISTORY  Chief Complaint Constipation    HPI Ashley Lynch is a 74 y.o. female presents to the ER for evaluation abdominal pain as well as constipation for the past several days.  Was recently placed on narcotic pain medication due to is a ER for evaluation of abdominal pain as well as constipation for the past several days.  Was recently placed on narcotic pain medication due to a back injury.  Has been taking stool softeners but with no relief. .  But had an episode of nausea and vomiting this morning which brought her to the ER    Past Medical History:  Diagnosis Date  . Cancer ([place])    lymphoma  . History of chicken pox   . History of measles, mumps, or rubella   . HTN (hypertension)   . Lymphoma in remission [facility])    Family History  Problem Relation Age of Onset  . Diabetes Mother        sibling and grandparent also   Past Surgical History:  Procedure Laterality Date  . ABDOMINAL HYSTERECTOMY  2013  . [place]  . TONSILLECTOMY  2010   Patient Active Problem List   Diagnosis Date Noted  . Atherosclerosis of native arteries of extremity with intermittent claudication ([place]) 11/15/2018  . Carotid stenosis 09/18/2017  . TIA (transient ischemic attack) 06/28/2017  . Chronic low back pain with left-sided sciatica 10/24/2016  . PVD (peripheral vascular disease) ([employer]) 05/01/2016  . Right carotid bruit 05/01/2016  . Pedal edema 05/01/2016  . 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 Chest pain 11/01/2015  . Health maintenance examination 05/27/2015  . Depression 05/27/2015  . Colon cancer screening 05/27/2015  . Decreased hearing of left ear 05/27/2015  . Left leg swelling 05/27/2015  . Neuropathic pain, leg  01/01/2015  . Left leg pain 01/01/2015  . Essential hypertension 12/23/2014  . Heartburn 11/18/2014  . Hypertension 11/18/2014  . Urinary frequency 11/18/2014  . Smoking 11/18/2014  . History of lymphoma 08/17/2014      Prior to Admission medications   Medication Sig Start Date End Date Taking? Authorizing Provider  amLODipine (NORVASC) 5 MG tablet Take 1 tablet each morning 06/19/16   Arlis Porta., MD  aspirin 81 MG tablet Take 81 mg by mouth daily.    [provider]  atorvastatin (LIPITOR) 10 MG tablet Take 10 mg by mouth daily.    [provider]  cholecalciferol (VITAMIN D) 1000 units tablet Take 1,000 Units by mouth daily.    [provider]  Cholecalciferol (VITAMIN D3) 50000 units CAPS TK ONE C PO ONCE A WK 07/10/17   [provider]  fluticasone (FLONASE) 50 MCG/ACT nasal spray instill 2 sprays into each nostril once daily Patient taking differently: instill 2 sprays into each nostril as needed 01/11/16   Arlis Porta., MD  Homeopathic Products (LEG CRAMP RELIEF PO) Take 1 tablet by mouth daily.     [provider]  lidocaine (LIDODERM) 5 % Place 1 patch onto the skin daily for 10 days. Remove & Discard patch within 24 hours or as directed by MD 10/20/19 10/30/19  Menshew, Dannielle Karvonen, PA-C  MAGNESIUM-OXIDE 400 (241.3 Mg) MG tablet TK 1  T PO QD 07/11/17   [provider]  methocarbamol (ROBAXIN) 500 MG tablet Take 1 tablet (500 mg total) by mouth 4 (four) times daily for 10 days. 10/20/19 10/30/19  Menshew, Dannielle Karvonen, PA-C  omeprazole (PRILOSEC) 20 MG capsule Take 1 capsule (20 mg total) by mouth daily. 11/27/16   Karamalegos, Devonne Doughty, DO  ondansetron (ZOFRAN ODT) 4 MG disintegrating tablet Take 1 tablet (4 mg total) by mouth every 8 (eight) hours as needed. 10/20/19   Menshew, Dannielle Karvonen, PA-C  oxybutynin (DITROPAN-XL) 5 MG 24 hr tablet TK 1 T PO ONCE D 07/23/17   [provider]  polyethylene glycol  (MIRALAX / GLYCOLAX) 17 g packet Take 17 g by mouth daily. Mix one tablespoon with 8oz of your favorite juice or water every day until you are having soft formed stools. Then start taking once daily if you didn't have a stool the day before. 10/29/19   Merlyn Lot, MD  vitamin B-12 (CYANOCOBALAMIN) 1000 MCG tablet Take 1,000 mcg by mouth daily.    [provider]    Allergies Patient has no known allergies.    Social History Social History   Tobacco Use  . Smoking status: Current Every Day Smoker    Packs/day: 1.00    Years: 52.00    Pack years: 52.00    Types: Cigarettes  . Smokeless tobacco: Current User  Substance Use Topics  . Alcohol use: No  . Drug use: No    Review of Systems Patient denies headaches, rhinorrhea, blurry vision, numbness, shortness of breath, chest pain, edema, cough, abdominal pain, nausea, vomiting, diarrhea, dysuria, fevers, rashes or hallucinations unless otherwise stated above in HPI. ____________________________________________   PHYSICAL EXAM:  VITAL SIGNS: Vitals:   10/29/19 1400 10/29/19 1500  BP: (!) 179/74 (!) 168/78  Pulse: 69 94  Resp: 14 17  Temp:    SpO2: 97% 97%    Constitutional: Alert and oriented.  Eyes: Conjunctivae are normal.  Head: Atraumatic. Nose: No congestion/rhinnorhea. Mouth/Throat: Mucous membranes are moist.   Neck: No stridor. Painless ROM.  Cardiovascular: Normal rate, regular rhythm. Grossly normal heart sounds.  Good peripheral circulation. Respiratory: Normal respiratory effort.  No retractions. Lungs CTAB. Gastrointestinal: Soft and nontender. No distention. No abdominal bruits. No CVA tenderness. Genitourinary:  Musculoskeletal: No lower extremity tenderness nor edema.  No joint effusions. Neurologic:  Normal speech and language. No gross focal neurologic deficits are appreciated. No facial droop Skin:  Skin is warm, dry and intact. No rash noted. Psychiatric: Mood and affect are normal.  Speech and behavior are normal.  ____________________________________________   LABS (all labs ordered are listed, but only abnormal results are displayed)  Results for orders placed or performed during the hospital encounter of 10/29/19 (from the past 24 hour(s))  Lipase, blood     Status: None   Collection Time: 10/29/19 10:01 AM  Result Value Ref Range   Lipase 20 11 - 51 U/L  Comprehensive metabolic panel     Status: Abnormal   Collection Time: 10/29/19 10:01 AM  Result Value Ref Range   Sodium 133 (L) 135 - 145 mmol/L   Potassium 3.9 3.5 - 5.1 mmol/L   Chloride 98 98 - 111 mmol/L   CO2 24 22 - 32 mmol/L   Glucose, Bld 103 (H) 70 - 99 mg/dL   BUN 21 8 - 23 mg/dL   Creatinine, Ser 1.17 (H) 0.44 - 1.00 mg/dL   Calcium 8.9 8.9 - 10.3 mg/dL  Total Protein 6.9 6.5 - 8.1 g/dL   Albumin 3.6 3.5 - 5.0 g/dL   AST 18 15 - 41 U/L   ALT 14 0 - 44 U/L   Alkaline Phosphatase 133 (H) 38 - 126 U/L   Total Bilirubin 0.5 0.3 - 1.2 mg/dL   GFR calc non Af Amer 46 (L) >60 mL/min   GFR calc Af Amer 54 (L) >60 mL/min   Anion gap 11 5 - 15  CBC     Status: Abnormal   Collection Time: 10/29/19 10:01 AM  Result Value Ref Range   WBC 8.5 4.0 - 10.5 K/uL   RBC 3.68 (L) 3.87 - 5.11 MIL/uL   Hemoglobin 11.5 (L) 12.0 - 15.0 g/dL   HCT 32.3 (L) 36 - 46 %   MCV 87.8 80.0 - 100.0 fL   MCH 31.3 26.0 - 34.0 pg   MCHC 35.6 30.0 - 36.0 g/dL   RDW 13.9 11.5 - 15.5 %   Platelets 222 150 - 400 K/uL   nRBC 0.0 0.0 - 0.2 %   ____________________________________________ ____________________________________________  RADIOLOGY  I personally reviewed all radiographic images ordered to evaluate for the above acute complaints and reviewed radiology reports and findings.  These findings were personally discussed with the patient.  Please see medical record for radiology report.  ____________________________________________   PROCEDURES  Procedure(s) performed:  Procedures    Critical Care  performed: no ____________________________________________   INITIAL IMPRESSION / ASSESSMENT AND PLAN / ED COURSE  Pertinent labs & imaging results that were available during my care of the patient were reviewed by me and considered in my medical decision making (see chart for details).   DDX: Constipation, obstipation, impaction, SBO, medication reaction  Ashley Lynch is a 74 y.o. who presents to the ED with evidence of severe constipation likely secondary to narcotic medications that she has been prescribed.  Does not seem consistent with SBO she has been passing gas and moving her bowels.  Will give oral as well as rectal laxatives and reassess.  Patient will be signed out to oncoming physician pending reassessment.     The patient was evaluated in Emergency Department today for the symptoms described in the history of present illness. He/she was evaluated in the context of the global COVID-19 pandemic, which necessitated consideration that the patient might be at risk for infection with the SARS-CoV-2 virus that causes COVID-19. Institutional protocols and algorithms that pertain to the evaluation of patients at risk for COVID-19 are in a state of rapid change based on information released by regulatory bodies including the CDC and federal and state organizations. These policies and algorithms were followed during the patient's care in the ED.  As part of my medical decision making, I reviewed the following data within the [place] notes reviewed and incorporated, Labs reviewed, notes from prior ED visits and [place] Controlled Substance Database   ____________________________________________   FINAL CLINICAL IMPRESSION(S) / ED DIAGNOSES  Final diagnoses:  Drug-induced constipation      NEW MEDICATIONS STARTED DURING THIS VISIT:  New Prescriptions   POLYETHYLENE GLYCOL (MIRALAX / GLYCOLAX) 17 G PACKET    Take 17 g by mouth daily. Mix one tablespoon with 8oz  of your favorite juice or water every day until you are having soft formed stools. Then start taking once daily if you didn't have a stool the day before.     Note:  This document was prepared using Systems analyst and  may include unintentional dictation errors.    Merlyn Lot, MD 10/29/19 1530

## 2019-12-17 DIAGNOSIS — Z955 Presence of coronary angioplasty implant and graft: Secondary | ICD-10-CM

## 2019-12-17 HISTORY — DX: Presence of coronary angioplasty implant and graft: Z95.5

## 2020-01-12 ENCOUNTER — Encounter: Payer: Self-pay | Admitting: Emergency Medicine

## 2020-01-12 ENCOUNTER — Inpatient Hospital Stay
Admission: EM | Admit: 2020-01-12 | Discharge: 2020-01-13 | DRG: 281 | Disposition: A | Discharge status: Other Acute Inpatient Hospital | Payer: Medicare PPO | Attending: Internal Medicine | Admitting: Internal Medicine

## 2020-01-12 ENCOUNTER — Other Ambulatory Visit: Payer: Self-pay

## 2020-01-12 ENCOUNTER — Emergency Department: Payer: Medicare PPO

## 2020-01-12 DIAGNOSIS — Z833 Family history of diabetes mellitus: Secondary | ICD-10-CM

## 2020-01-12 DIAGNOSIS — D649 Anemia, unspecified: Secondary | ICD-10-CM | POA: Diagnosis not present

## 2020-01-12 DIAGNOSIS — Z20822 Contact with and (suspected) exposure to covid-19: Secondary | ICD-10-CM | POA: Diagnosis present

## 2020-01-12 DIAGNOSIS — N179 Acute kidney failure, unspecified: Secondary | ICD-10-CM

## 2020-01-12 DIAGNOSIS — I739 Peripheral vascular disease, unspecified: Secondary | ICD-10-CM | POA: Diagnosis present

## 2020-01-12 DIAGNOSIS — I214 Non-ST elevation (NSTEMI) myocardial infarction: Principal | ICD-10-CM | POA: Diagnosis present

## 2020-01-12 DIAGNOSIS — I745 Embolism and thrombosis of iliac artery: Secondary | ICD-10-CM | POA: Diagnosis present

## 2020-01-12 DIAGNOSIS — I10 Essential (primary) hypertension: Secondary | ICD-10-CM | POA: Diagnosis not present

## 2020-01-12 DIAGNOSIS — N1832 Chronic kidney disease, stage 3b: Secondary | ICD-10-CM

## 2020-01-12 DIAGNOSIS — D72829 Elevated white blood cell count, unspecified: Secondary | ICD-10-CM | POA: Diagnosis present

## 2020-01-12 DIAGNOSIS — R079 Chest pain, unspecified: Secondary | ICD-10-CM | POA: Diagnosis present

## 2020-01-12 DIAGNOSIS — I2510 Atherosclerotic heart disease of native coronary artery without angina pectoris: Secondary | ICD-10-CM | POA: Diagnosis present

## 2020-01-12 DIAGNOSIS — Z8572 Personal history of non-Hodgkin lymphomas: Secondary | ICD-10-CM

## 2020-01-12 DIAGNOSIS — Z8673 Personal history of transient ischemic attack (TIA), and cerebral infarction without residual deficits: Secondary | ICD-10-CM

## 2020-01-12 DIAGNOSIS — R0789 Other chest pain: Secondary | ICD-10-CM | POA: Diagnosis not present

## 2020-01-12 DIAGNOSIS — I493 Ventricular premature depolarization: Secondary | ICD-10-CM | POA: Diagnosis present

## 2020-01-12 DIAGNOSIS — Z9221 Personal history of antineoplastic chemotherapy: Secondary | ICD-10-CM

## 2020-01-12 DIAGNOSIS — Z7982 Long term (current) use of aspirin: Secondary | ICD-10-CM

## 2020-01-12 DIAGNOSIS — E785 Hyperlipidemia, unspecified: Secondary | ICD-10-CM | POA: Diagnosis present

## 2020-01-12 DIAGNOSIS — M79602 Pain in left arm: Secondary | ICD-10-CM

## 2020-01-12 DIAGNOSIS — F17210 Nicotine dependence, cigarettes, uncomplicated: Secondary | ICD-10-CM | POA: Diagnosis present

## 2020-01-12 DIAGNOSIS — Z79899 Other long term (current) drug therapy: Secondary | ICD-10-CM

## 2020-01-12 LAB — RESPIRATORY PANEL BY RT PCR (FLU A&B, COVID)
Influenza A by PCR: NEGATIVE
Influenza B by PCR: NEGATIVE
SARS Coronavirus 2 by RT PCR: NEGATIVE

## 2020-01-12 LAB — BASIC METABOLIC PANEL
Anion gap: 11 (ref 5–15)
BUN: 22 mg/dL (ref 8–23)
CO2: 24 mmol/L (ref 22–32)
Calcium: 9.4 mg/dL (ref 8.9–10.3)
Chloride: 101 mmol/L (ref 98–111)
Creatinine, Ser: 1.21 mg/dL — ABNORMAL HIGH (ref 0.44–1.00)
GFR calc Af Amer: 51 mL/min — ABNORMAL LOW (ref >60)
GFR calc non Af Amer: 44 mL/min — ABNORMAL LOW (ref >60)
Glucose, Bld: 121 mg/dL — ABNORMAL HIGH (ref 70–99)
Potassium: 3.8 mmol/L (ref 3.5–5.1)
Sodium: 136 mmol/L (ref 135–145)

## 2020-01-12 LAB — CBC
HCT: 30.8 % — ABNORMAL LOW (ref 36.0–46.0)
Hemoglobin: 10.8 g/dL — ABNORMAL LOW (ref 12.0–15.0)
MCH: 31.3 pg (ref 26.0–34.0)
MCHC: 35.1 g/dL (ref 30.0–36.0)
MCV: 89.3 fL (ref 80.0–100.0)
Platelets: 260 10*3/uL (ref 150–400)
RBC: 3.45 MIL/uL — ABNORMAL LOW (ref 3.87–5.11)
RDW: 14.1 % (ref 11.5–15.5)
WBC: 13.9 10*3/uL — ABNORMAL HIGH (ref 4.0–10.5)
nRBC: 0 % (ref 0.0–0.2)

## 2020-01-12 LAB — PROTIME-INR
INR: 1.1 (ref 0.8–1.2)
Prothrombin Time: 13.6 seconds (ref 11.4–15.2)

## 2020-01-12 LAB — TROPONIN I (HIGH SENSITIVITY)
Troponin I (High Sensitivity): 660 ng/L (ref <18)
Troponin I (High Sensitivity): 599 ng/L (ref <18)
Troponin I (High Sensitivity): 600 ng/L (ref <18)
Troponin I (High Sensitivity): 460 ng/L (ref <18)

## 2020-01-12 LAB — APTT: aPTT: 30 seconds (ref 24–36)

## 2020-01-12 LAB — PROCALCITONIN: Procalcitonin: 1.38 ng/mL

## 2020-01-12 MED ORDER — HEPARIN (PORCINE) 25000 UT/250ML-% IV SOLN
850.0000 [IU]/h | INTRAVENOUS | Status: DC
  Administered 2020-01-12: 700 [IU]/h via INTRAVENOUS
  Filled 2020-01-12: qty 250

## 2020-01-12 MED ORDER — ENOXAPARIN SODIUM 60 MG/0.6ML ~~LOC~~ SOLN
1.0000 mg/kg | Freq: Once | SUBCUTANEOUS | Status: DC
  Filled 2020-01-12: qty 0.6

## 2020-01-12 MED ORDER — HEPARIN BOLUS VIA INFUSION
3000.0000 [IU] | Freq: Once | INTRAVENOUS | Status: AC
  Administered 2020-01-12: 3000 [IU] via INTRAVENOUS
  Filled 2020-01-12: qty 3000

## 2020-01-12 MED ORDER — VITAMIN B-12 1000 MCG PO TABS
1000.0000 ug | ORAL_TABLET | Freq: Every day | ORAL | Status: DC
  Administered 2020-01-13: 1000 ug via ORAL
  Filled 2020-01-12 (×2): qty 1

## 2020-01-12 MED ORDER — VITAMIN B-12 1000 MCG PO TABS: 1000.0000 ug | ORAL_TABLET | Freq: Every day | ORAL | Status: DC

## 2020-01-12 MED ORDER — ALUM & MAG HYDROXIDE-SIMETH 200-200-20 MG/5ML PO SUSP
30.0000 mL | Freq: Once | ORAL | Status: AC
  Administered 2020-01-12: 30 mL via ORAL
  Filled 2020-01-12: qty 30

## 2020-01-12 MED ORDER — ACETAMINOPHEN 325 MG PO TABS: 650.0000 mg | ORAL_TABLET | ORAL | Status: DC | PRN

## 2020-01-12 MED ORDER — AMLODIPINE BESYLATE 5 MG PO TABS
5.0000 mg | ORAL_TABLET | Freq: Every day | ORAL | Status: DC
  Filled 2020-01-12: qty 1

## 2020-01-12 MED ORDER — PANTOPRAZOLE SODIUM 40 MG PO TBEC
40.0000 mg | DELAYED_RELEASE_TABLET | Freq: Every day | ORAL | Status: DC
  Administered 2020-01-13: 40 mg via ORAL
  Filled 2020-01-12: qty 1

## 2020-01-12 MED ORDER — ONDANSETRON HCL 4 MG/2ML IJ SOLN: 4.0000 mg | Freq: Four times a day (QID) | INTRAMUSCULAR | Status: DC | PRN

## 2020-01-12 MED ORDER — ROSUVASTATIN CALCIUM 10 MG PO TABS
10.0000 mg | ORAL_TABLET | Freq: Every day | ORAL | Status: DC
  Administered 2020-01-13: 10 mg via ORAL
  Filled 2020-01-12 (×2): qty 1

## 2020-01-12 MED ORDER — IOHEXOL 350 MG/ML SOLN
75.0000 mL | Freq: Once | INTRAVENOUS | Status: AC | PRN
  Administered 2020-01-12: 75 mL via INTRAVENOUS

## 2020-01-12 MED ORDER — ROSUVASTATIN CALCIUM 10 MG PO TABS: 10.0000 mg | ORAL_TABLET | Freq: Every day | ORAL | Status: DC

## 2020-01-12 MED ORDER — SODIUM CHLORIDE 0.9 % IV SOLN: INTRAVENOUS | Status: AC

## 2020-01-12 MED ORDER — ASPIRIN EC 81 MG PO TBEC
81.0000 mg | DELAYED_RELEASE_TABLET | Freq: Every day | ORAL | Status: DC
  Administered 2020-01-13: 81 mg via ORAL
  Filled 2020-01-12: qty 1

## 2020-01-12 MED ORDER — TRAMADOL HCL 50 MG PO TABS: 50.0000 mg | ORAL_TABLET | Freq: Two times a day (BID) | ORAL | Status: DC | PRN

## 2020-01-12 MED ORDER — KETOROLAC TROMETHAMINE 30 MG/ML IJ SOLN
30.0000 mg | Freq: Once | INTRAMUSCULAR | Status: AC
  Administered 2020-01-12: 30 mg via INTRAVENOUS
  Filled 2020-01-12: qty 1

## 2020-01-12 NOTE — H&P (Signed)
History and Physical    Ashley Lynch SFK:[id number] DOB: 15-Mar-1946 DOA: 01/12/2020  PCP: Perrin Maltese, MD  Patient coming from: Home, her granddaughter who lives with her is at bedside  I have personally briefly reviewed patient's old medical records in [place]  Chief Complaint: Chest pain  HPI: Ashley Lynch is a 74 y.o. female with medical history significant for history of lymphoma s/p tonsillectomy and chemotherapy in remission, hypertension, PVD, TIA who presents with concerns of headache and chest pain.  Patient states that she developed a headache that she describes as soreness last night on the top and back of her head.  This persisted when she awoke this morning when she was working on her computer when she also developed anterior pressure chest pain that radiated to either side of her neck and then to her right shoulder and right arm.  States pain still persisted until she got to the ED where she received medication.  She also noted increasing shortness of breath last night but denies any wheezing.  She is a current half a pack to a pack smoker and has been doing it for 50 years.  She denies any cough or fever but has been having runny nose that she thinks is due to allergies.  She also notes heartburn today but has been compliant with taking her PPI daily. No trauma or heavy lifting.No new meds other than taking Benadryl recently for her sleep and allergies.  ED Course: She had temperature of 38F, intermittently bradycardic at times and normotensive on room air. Lab work is significant for elevated troponin of 660 that later trended down to 599.  EKG is similar to prior except more frequent PVCs.  No significant ST or T wave changes.  CBC that showed mild leukocytosis of 13.9, stable normocytic anemia with hemoglobin of 10.8.  Creatinine elevated at 1.21 from a prior of 1.17 a few months ago.  CT angio chest did not show any dissection.  She was given 30 mg of Toradol and  in the ED.  Review of Systems:  Constitutional: No Weight Change, No Fever ENT/Mouth: No sore throat, No Rhinorrhea Eyes: No Eye Pain, No Vision Changes Cardiovascular: + Chest Pain, + SOB+ Dyspnea on Exertion Respiratory: No Cough, No Sputum, No Wheezing, + Dyspnea  Gastrointestinal: No Nausea, No Vomiting, No Diarrhea, No Constipation, No Pain Genitourinary: no Urinary Incontinence, No Urgency, No Flank Pain Musculoskeletal: No Arthralgias, No Myalgias Skin: No Skin Lesions, No Pruritus, Neuro: no Weakness, No Numbness Psych: No Anxiety/Panic, No Depression, no decrease appetite Heme/Lymph: No Bruising, No Bleeding  Past Medical History:  Diagnosis Date  . Cancer ([place])    lymphoma  . History of chicken pox   . History of measles, mumps, or rubella   . HTN (hypertension)   . Lymphoma in remission [facility])     Past Surgical History:  Procedure Laterality Date  . ABDOMINAL HYSTERECTOMY  2013  . [place]  . TONSILLECTOMY  2010     reports that she has been smoking cigarettes. She has a 52.00 pack-year smoking history. She uses smokeless tobacco. She reports that she does not drink alcohol and does not use drugs. Social History  No Known Allergies  Family History  Problem Relation Age of Onset  . Diabetes Mother        sibling and grandparent also     Prior to Admission medications   Medication Sig Start Date End Date Taking? Authorizing  Provider  amLODipine (NORVASC) 5 MG tablet Take 1 tablet each morning 06/19/16  Yes Arlis Porta., MD  aspirin 81 MG tablet Take 81 mg by mouth daily.   Yes [provider]  benazepril (LOTENSIN) 20 MG tablet Take 20 mg by mouth daily. 01/08/20  Yes [provider]  furosemide (LASIX) 20 MG tablet Take 20 mg by mouth daily. 12/11/19  Yes [provider]  omeprazole (PRILOSEC) 20 MG capsule Take 1 capsule (20 mg total) by mouth daily. 11/27/16  Yes Karamalegos, Devonne Doughty, DO   rosuvastatin (CRESTOR) 10 MG tablet Take 10 mg by mouth daily. 09/02/19  Yes [provider]  vitamin B-12 (CYANOCOBALAMIN) 1000 MCG tablet Take 1,000 mcg by mouth daily.   Yes [provider]  fluticasone Asencion Islam) 50 MCG/ACT nasal spray instill 2 sprays into each nostril once daily 01/11/16   Arlis Porta., MD  ondansetron (ZOFRAN ODT) 4 MG disintegrating tablet Take 1 tablet (4 mg total) by mouth every 8 (eight) hours as needed. Patient not taking: Reported on 01/12/2020 10/20/19   Menshew, Dannielle Karvonen, PA-C  polyethylene glycol (MIRALAX / GLYCOLAX) 17 g packet Take 17 g by mouth daily. Mix one tablespoon with 8oz of your favorite juice or water every day until you are having soft formed stools. Then start taking once daily if you didn't have a stool the day before. Patient not taking: Reported on 01/12/2020 10/29/19   Arta Silence, MD  traMADol (ULTRAM) 50 MG tablet Take 50 mg by mouth 3 (three) times daily as needed. 12/25/19   [provider]    Physical Exam: Vitals:   01/12/20 1337 01/12/20 1700 01/12/20 1753 01/12/20 1915  BP: (!) 156/55 (!) 158/64 (!) 119/59 (!) 186/64  Pulse:  61 (!) 59 71  Resp:  (!) 24 13 18   Temp:      TempSrc:      SpO2:  98% 98% 98%  Weight:      Height:        Constitutional: NAD, calm, comfortable, thin elderly female sitting at 40 degree incline in bed Vitals:   01/12/20 1337 01/12/20 1700 01/12/20 1753 01/12/20 1915  BP: (!) 156/55 (!) 158/64 (!) 119/59 (!) 186/64  Pulse:  61 (!) 59 71  Resp:  (!) 24 13 18   Temp:      TempSrc:      SpO2:  98% 98% 98%  Weight:      Height:       Eyes: PERRL, lids and conjunctivae normal ENMT: Mucous membranes are moist.  Neck: normal, supple Respiratory: clear to auscultation bilaterally, no wheezing, no crackles. Normal respiratory effort.  Cardiovascular: Regular rate and rhythm, no murmurs / rubs / gallops. No extremity edema.  Abdomen: no tenderness, no masses  palpated.  Bowel sounds positive.  Musculoskeletal: no clubbing / cyanosis. No joint deformity upper and lower extremities. Good ROM, no contractures. Normal muscle tone.  Skin: no rashes, lesions, ulcers. No induration Neurologic: CN 2-12 grossly intact. Sensation intact, Strength 5/5 in all 4.  Psychiatric: Normal judgment and insight. Alert and oriented x 3. Normal mood.     Labs on Admission: I have personally reviewed following labs and imaging studies  CBC: Recent Labs  Lab 01/12/20 1348  WBC 13.9*  HGB 10.8*  HCT 30.8*  MCV 89.3  PLT 332   Basic Metabolic Panel: Recent Labs  Lab 01/12/20 1348  NA 136  K 3.8  CL 101  CO2 24  GLUCOSE 121*  BUN 22  CREATININE 1.21*  CALCIUM 9.4   GFR: Estimated Creatinine Clearance: 33.7 mL/min (A) (by C-G formula based on SCr of 1.21 mg/dL (H)). Liver Function Tests: No results for input(s): AST, ALT, ALKPHOS, BILITOT, PROT, ALBUMIN in the last 168 hours. No results for input(s): LIPASE, AMYLASE in the last 168 hours. No results for input(s): AMMONIA in the last 168 hours. Coagulation Profile: No results for input(s): INR, PROTIME in the last 168 hours. Cardiac Enzymes: No results for input(s): CKTOTAL, CKMB, CKMBINDEX, TROPONINI in the last 168 hours. BNP (last 3 results) No results for input(s): PROBNP in the last 8760 hours. HbA1C: No results for input(s): HGBA1C in the last 72 hours. CBG: No results for input(s): GLUCAP in the last 168 hours. Lipid Profile: No results for input(s): CHOL, HDL, LDLCALC, TRIG, CHOLHDL, LDLDIRECT in the last 72 hours. Thyroid Function Tests: No results for input(s): TSH, T4TOTAL, FREET4, T3FREE, THYROIDAB in the last 72 hours. Anemia Panel: No results for input(s): VITAMINB12, FOLATE, FERRITIN, TIBC, IRON, RETICCTPCT in the last 72 hours. Urine analysis:    Component Value Date/Time   COLORURINE STRAW (A) 06/28/2017 1615   APPEARANCEUR CLEAR (A) 06/28/2017 1615   LABSPEC 1.004 (L)  06/28/2017 1615   PHURINE 6.0 06/28/2017 1615   GLUCOSEU NEGATIVE 06/28/2017 1615   HGBUR SMALL (A) 06/28/2017 1615   BILIRUBINUR NEGATIVE 06/28/2017 1615   KETONESUR NEGATIVE 06/28/2017 1615   PROTEINUR NEGATIVE 06/28/2017 1615   NITRITE NEGATIVE 06/28/2017 1615   LEUKOCYTESUR NEGATIVE 06/28/2017 1615    Radiological Exams on Admission: DG Chest 2 View  Result Date: 01/12/2020 CLINICAL DATA:  Acute onset chest, neck, back and arm pain today. EXAM: CHEST - 2 VIEW COMPARISON:  PA and lateral chest 10/21/2008. FINDINGS: Port-A-Cath present on the prior exam has been removed. Lungs are clear. Heart size is normal. No pneumothorax or pleural fluid. No acute or focal bony abnormality. IMPRESSION: No acute disease. Electronically Signed   By: Inge Rise M.D.   On: 01/12/2020 14:15   CT Angio Chest Aorta w/CM &/OR wo/CM  Result Date: 01/12/2020 CLINICAL DATA:  Chest pain or back pain, aortic dissection suspected EXAM: CT ANGIOGRAPHY CHEST WITH CONTRAST TECHNIQUE: Precontrast images were obtained. Multidetector CT imaging of the chest was performed using the standard protocol during bolus administration of intravenous contrast. Multiplanar CT image reconstructions and MIPs were obtained to evaluate the vascular anatomy. CONTRAST:  61mL OMNIPAQUE IOHEXOL 350 MG/ML SOLN COMPARISON:  Radiograph earlier today. FINDINGS: Cardiovascular: No aortic hematoma on noncontrast exam. There is no aortic dissection. Advanced calcified noncalcified atheromatous plaque throughout the thoracic aorta. There is no periaortic stranding. No evidence of penetrating ulcer or acute aortic findings. Descending aortic tortuosity. No aortic aneurysm. Conventional branching pattern from the aortic arch. There is just over 50% luminal narrowing of the origin of the left common carotid artery due to atheromatous plaque. Branch vessels otherwise patent. Mild dilatation of the main pulmonary artery at 3.4 cm. There are no obvious  pulmonary arterial lung filling defects, evaluation at tailored to pulmonary artery evaluation. Only the main pulmonary artery is well evaluated. There are coronary artery calcifications. Heart size upper normal. No pericardial effusion. Mediastinum/Nodes: Shotty mediastinal lymph nodes not enlarged by size criteria. There is no hilar adenopathy. No esophageal wall thickening. No thyroid nodule. Lungs/Pleura: Subpleural reticulation within the dependent and basilar lungs. Mild bronchiolectasis in the right lower lobe. No acute or confluent airspace disease. No findings of pulmonary edema. No pleural fluid. Trachea  and central bronchi are patent. There is no pulmonary mass or dominant nodule. Upper Abdomen: Chronic pneumobilia, unchanged from 03/14/2017 abdominal CT. No acute findings. Musculoskeletal: There are no acute or suspicious osseous abnormalities. Review of the MIP images confirms the above findings. IMPRESSION: 1. Diffuse calcified and noncalcified atheromatous plaque throughout the thoracic aorta without dissection or acute aortic findings. 2. Mild dilatation of the main pulmonary artery suggesting pulmonary arterial hypertension. 3. Subpleural reticulation in the dependent and basilar lungs. This may represent hypoventilatory change or can be seen in the setting of interstitial lung disease. This could be further assessed none high-resolution chest CT on an elective basis. 4. Coronary artery calcifications. Aortic Atherosclerosis (ICD10-I70.0). Electronically Signed   By: Keith Rake M.D.   On: 01/12/2020 17:56      Assessment/Plan  Chest pain Patient presented with chest pain with radiation of bilateral neck and right upper extremity relieved with Toradol and Nitropaste. Initial troponin of 660 that downward trended to 599.  Will continue to trend to see if it is a downward trend.  No other clear explanation of her elevated troponin.  She does have mildly elevated AKI but would not suspect  that it would elevate her troponin this much. Has risk factors with HTN, current tobacco use, and significant atherosclerotic plaque seen on CTA. will start Heparin gtt for now for NSTEMI.  Obtain echo in the morning. Last echo in 12/2017 with EF of 55-60% with normal diastolic function  She also has noted leukocytosis but currently afebrile and no findings  on CTA. If she develops fever, low threshold to treat for community acquired pneumonia. Check PCT.  Trial Maalox  AKI creatinine of 1.21 from prior of 1.17 give continous IV fluids for 8 hrs and recheck avoid nephrotoxic agent  Anemia Hgb of 10.8 from prior of 11.5. No overt bleeding check iron, vitamin B04 and folic- she also endorse peripheral neuropathy  Hx of HTN Hold benazepril, Lasix due to AKI.  Continue amlodipine  DVT prophylaxis: Heparin gtt Code Status: Full Family Communication: Plan discussed with patient and granddaughter at bedside  disposition Plan: Home with observation Consults called:  Admission status: Observation  Status is: Observation  The patient remains OBS appropriate and will d/c before 2 midnights.  Dispo: The patient is from: Home              Anticipated d/c is to: Home              Anticipated d/c date is: 1 day              Patient currently is not medically stable to d/c.         Orene Desanctis DO [facility]   If 7PM-7AM, please contact night-coverage [email or web address]   01/12/2020, 8:15 PM

## 2020-01-12 NOTE — ED Notes (Signed)
Pt transported to CT at this time.

## 2020-01-12 NOTE — Consult Note (Signed)
ANTICOAGULATION CONSULT NOTE - Initial Consult  Pharmacy Consult for Heparin Infusion Indication: chest pain/ACS  No Known Allergies  Patient Measurements: Height: 5\' 3"  (160 cm) Weight: 58.1 kg (128 lb) IBW/kg (Calculated) : 52.4 Heparin Dosing Weight: 58.1 kg  Labs: Recent Labs    01/12/20 1348 01/12/20 1548  HGB 10.8*  --   HCT 30.8*  --   PLT 260  --   CREATININE 1.21*  --   TROPONINIHS 660* 599*    Estimated Creatinine Clearance: 33.7 mL/min (A) (by C-G formula based on SCr of 1.21 mg/dL (H)).   Medical History: Past Medical History:  Diagnosis Date  . Cancer ([place])    lymphoma  . History of chicken pox   . History of measles, mumps, or rubella   . HTN (hypertension)   . Lymphoma in remission [facility])     Medications:  No anticoagulation prior to admission per chart review  Assessment: Patient is a 74 y/o F with medical history as above who presented to the ED 9/27 with chief complaint of chest pain. CT negative for aortic dissection. Troponin elevated (660 >> 599). Pharmacy has been consulted to initiate heparin infusion for ACS.  Baseline CBC with Hgb 10.8 (last was 11.5 on 10/29/19). Platelets within normal limits. Baseline aPTT and PT-INR pending.  Goal of Therapy:  Heparin level 0.3-0.7 units/ml Monitor platelets by anticoagulation protocol: Yes   Plan:  --Heparin 3000 unit IV bolus x 1 followed by continuous infusion at 700 units/hr --HL 8 hours after initiation of infusion --Daily CBC per protocol  Benita Gutter 01/12/2020,8:15 PM

## 2020-01-12 NOTE — ED Triage Notes (Signed)
Pt in via EMS from home with c/o sudden onset of chest pain, neck pain, back pain, arm pain and HTN. 190/82.  1in of nitr paste applied. #22 g to right [facility]

## 2020-01-12 NOTE — ED Triage Notes (Signed)
Says headache started last night and then chest pain started today and then pain in neck and right arm.  Says just before arrival her back was hurting too.

## 2020-01-12 NOTE — ED Provider Notes (Signed)
[facility] Emergency Department Provider Note   ____________________________________________   First MD Initiated Contact with Patient 01/12/20 1557     (approximate)  I have reviewed the triage vital signs and the nursing notes.   HISTORY  Chief Complaint Headache, Chest Pain, Neck Pain, and Arm Pain    HPI Ashley Lynch is a 74 y.o. female with a stated past medical history of hypertension who presents for chest pain that began this morning. Patient describes substernal chest tightness that radiates to the left neck and left arm that began approximately 10 hours prior to arrival. Patient denies ever having similar symptoms in the past. Patient denies this pain being exertional. Patient also states that this pain radiates into her back and is 5/10 in severity. Patient got 324 of aspirin and an inch of Nitropaste prior to my evaluation and states that her pain is much resolved.         Past Medical History:  Diagnosis Date  . Cancer ([place])    lymphoma  . History of chicken pox   . History of measles, mumps, or rubella   . HTN (hypertension)   . Lymphoma in remission [facility])     Patient Active Problem List   Diagnosis Date Noted  . AKI (acute kidney injury) ([place]) 01/12/2020  . Anemia 01/12/2020  . Atherosclerosis of native arteries of extremity with intermittent claudication ([place]) 11/15/2018  . Carotid stenosis 09/18/2017  . TIA (transient ischemic attack) 06/28/2017  . Chronic low back pain with left-sided sciatica 10/24/2016  . PVD (peripheral vascular disease) ([place]) 05/01/2016  . Right carotid bruit 05/01/2016  . Pedal edema 05/01/2016  . 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[place]) 01/12/2016  . Chest pain 11/01/2015  . Health maintenance examination 05/27/2015  . Depression 05/27/2015  . Colon cancer screening 05/27/2015  . Decreased hearing of left ear 05/27/2015  . Left leg swelling 05/27/2015  . Neuropathic pain, leg 01/01/2015  .  Left leg pain 01/01/2015  . Essential hypertension 12/23/2014  . Heartburn 11/18/2014  . Hypertension 11/18/2014  . Urinary frequency 11/18/2014  . Smoking 11/18/2014  . History of lymphoma 08/17/2014    Past Surgical History:  Procedure Laterality Date  . ABDOMINAL HYSTERECTOMY  2013  . [place]  . TONSILLECTOMY  2010    Prior to Admission medications   Medication Sig Start Date End Date Taking? Authorizing Provider  amLODipine (NORVASC) 5 MG tablet Take 1 tablet each morning 06/19/16  Yes Arlis Porta., MD  aspirin 81 MG tablet Take 81 mg by mouth daily.   Yes [provider]  benazepril (LOTENSIN) 20 MG tablet Take 20 mg by mouth daily. 01/08/20  Yes [provider]  furosemide (LASIX) 20 MG tablet Take 20 mg by mouth daily. 12/11/19  Yes [provider]  omeprazole (PRILOSEC) 20 MG capsule Take 1 capsule (20 mg total) by mouth daily. 11/27/16  Yes Karamalegos, Devonne Doughty, DO  rosuvastatin (CRESTOR) 10 MG tablet Take 10 mg by mouth daily. 09/02/19  Yes [provider]  vitamin B-12 (CYANOCOBALAMIN) 1000 MCG tablet Take 1,000 mcg by mouth daily.   Yes [provider]  fluticasone Asencion Islam) 50 MCG/ACT nasal spray instill 2 sprays into each nostril once daily 01/11/16   Arlis Porta., MD  ondansetron (ZOFRAN ODT) 4 MG disintegrating tablet Take 1 tablet (4 mg total) by mouth every 8 (eight) hours as needed. Patient not taking: Reported on 01/12/2020 10/20/19  Menshew, Dannielle Karvonen, PA-C  polyethylene glycol (MIRALAX / GLYCOLAX) 17 g packet Take 17 g by mouth daily. Mix one tablespoon with 8oz of your favorite juice or water every day until you are having soft formed stools. Then start taking once daily if you didn't have a stool the day before. Patient not taking: Reported on 01/12/2020 10/29/19   Arta Silence, MD  traMADol (ULTRAM) 50 MG tablet Take 50 mg by mouth 3 (three) times daily as needed. 12/25/19    [provider]    Allergies Patient has no known allergies.  Family History  Problem Relation Age of Onset  . Diabetes Mother        sibling and grandparent also    Social History Social History   Tobacco Use  . Smoking status: Current Every Day Smoker    Packs/day: 1.00    Years: 52.00    Pack years: 52.00    Types: Cigarettes  . Smokeless tobacco: Current User  Substance Use Topics  . Alcohol use: No  . Drug use: No    Review of Systems Constitutional: No fever/chills Eyes: No visual changes. ENT: No sore throat. Cardiovascular: Endorses chest pain. Respiratory: Denies shortness of breath. Gastrointestinal: No abdominal pain.  No nausea, no vomiting.  No diarrhea. Genitourinary: Negative for dysuria. Musculoskeletal: Negative for acute arthralgias Skin: Negative for rash. Neurological: Negative for headaches, weakness/numbness/paresthesias in any extremity Psychiatric: Negative for suicidal ideation/homicidal ideation   ____________________________________________   PHYSICAL EXAM:  VITAL SIGNS: ED Triage Vitals  Enc Vitals Group     BP 01/12/20 1337 (!) 156/55     Pulse Rate 01/12/20 1334 69     Resp 01/12/20 1334 16     Temp 01/12/20 1334 99 F (37.2 C)     Temp Source 01/12/20 1334 Oral     SpO2 01/12/20 1334 99 %     Weight 01/12/20 1335 128 lb (58.1 kg)     Height 01/12/20 1335 5\' 3"  (1.6 m)     Head Circumference --      Peak Flow --      Pain Score 01/12/20 1335 4     Pain Loc --      Pain Edu? --      Excl. in [place]? --    Constitutional: Alert and oriented. Well appearing and in no acute distress. Eyes: Conjunctivae are normal. PERRL. Head: Atraumatic. Nose: No congestion/rhinnorhea. Mouth/Throat: Mucous membranes are moist. Neck: No stridor Cardiovascular: Grossly normal heart sounds.  Good peripheral circulation. Respiratory: Normal respiratory effort.  No retractions. Gastrointestinal: Soft and nontender. No  distention. Musculoskeletal: No obvious deformities Neurologic:  Normal speech and language. No gross focal neurologic deficits are appreciated. Skin:  Skin is warm and dry. No rash noted. Psychiatric: Mood and affect are normal. Speech and behavior are normal.  ____________________________________________   LABS (all labs ordered are listed, but only abnormal results are displayed)  Labs Reviewed  BASIC METABOLIC PANEL - Abnormal; Notable for the following components:      Result Value   Glucose, Bld 121 (*)    Creatinine, Ser 1.21 (*)    GFR calc non Af Amer 44 (*)    GFR calc Af Amer 51 (*)    All other components within normal limits  CBC - Abnormal; Notable for the following components:   WBC 13.9 (*)    RBC 3.45 (*)    Hemoglobin 10.8 (*)    HCT 30.8 (*)    All other components  within normal limits  TROPONIN I (HIGH SENSITIVITY) - Abnormal; Notable for the following components:   Troponin I (High Sensitivity) 660 (*)    All other components within normal limits  TROPONIN I (HIGH SENSITIVITY) - Abnormal; Notable for the following components:   Troponin I (High Sensitivity) 599 (*)    All other components within normal limits  RESPIRATORY PANEL BY RT PCR (FLU A&B, COVID)  LIPID PANEL  PROCALCITONIN  APTT  PROTIME-INR  HEPARIN LEVEL (UNFRACTIONATED)  CBC  IRON AND TIBC  VITAMIN B12  FOLATE  TROPONIN I (HIGH SENSITIVITY)   ____________________________________________  EKG  ED ECG REPORT I, Naaman Plummer, the attending physician, personally viewed and interpreted this ECG.  Date: 01/12/2020 EKG Time: 1648 Rate: 61 Rhythm: normal sinus rhythm QRS Axis: normal Intervals: normal ST/T Wave abnormalities: normal Narrative Interpretation: no evidence of acute ischemia  ____________________________________________  RADIOLOGY  ED MD interpretation: 2 view x-ray of the chest as well as CT angiography of the chest showed no evidence of acute pneumonia,  pneumothorax, or widened mediastinum as well as no evidence of active dissection at this time. Patient does have chronic changes it can be read and CT report.  Official radiology report(s): DG Chest 2 View  Result Date: 01/12/2020 CLINICAL DATA:  Acute onset chest, neck, back and arm pain today. EXAM: CHEST - 2 VIEW COMPARISON:  PA and lateral chest 10/21/2008. FINDINGS: Port-A-Cath present on the prior exam has been removed. Lungs are clear. Heart size is normal. No pneumothorax or pleural fluid. No acute or focal bony abnormality. IMPRESSION: No acute disease. Electronically Signed   By: Inge Rise M.D.   On: 01/12/2020 14:15   CT Angio Chest Aorta w/CM &/OR wo/CM  Result Date: 01/12/2020 CLINICAL DATA:  Chest pain or back pain, aortic dissection suspected EXAM: CT ANGIOGRAPHY CHEST WITH CONTRAST TECHNIQUE: Precontrast images were obtained. Multidetector CT imaging of the chest was performed using the standard protocol during bolus administration of intravenous contrast. Multiplanar CT image reconstructions and MIPs were obtained to evaluate the vascular anatomy. CONTRAST:  43mL OMNIPAQUE IOHEXOL 350 MG/ML SOLN COMPARISON:  Radiograph earlier today. FINDINGS: Cardiovascular: No aortic hematoma on noncontrast exam. There is no aortic dissection. Advanced calcified noncalcified atheromatous plaque throughout the thoracic aorta. There is no periaortic stranding. No evidence of penetrating ulcer or acute aortic findings. Descending aortic tortuosity. No aortic aneurysm. Conventional branching pattern from the aortic arch. There is just over 50% luminal narrowing of the origin of the left common carotid artery due to atheromatous plaque. Branch vessels otherwise patent. Mild dilatation of the main pulmonary artery at 3.4 cm. There are no obvious pulmonary arterial lung filling defects, evaluation at tailored to pulmonary artery evaluation. Only the main pulmonary artery is well evaluated. There are  coronary artery calcifications. Heart size upper normal. No pericardial effusion. Mediastinum/Nodes: [place] mediastinal lymph nodes not enlarged by size criteria. There is no hilar adenopathy. No esophageal wall thickening. No thyroid nodule. Lungs/Pleura: Subpleural reticulation within the dependent and basilar lungs. Mild bronchiolectasis in the right lower lobe. No acute or confluent airspace disease. No findings of pulmonary edema. No pleural fluid. Trachea and central bronchi are patent. There is no pulmonary mass or dominant nodule. Upper Abdomen: Chronic pneumobilia, unchanged from 03/14/2017 abdominal CT. No acute findings. Musculoskeletal: There are no acute or suspicious osseous abnormalities. Review of the MIP images confirms the above findings. IMPRESSION: 1. Diffuse calcified and noncalcified atheromatous plaque throughout the thoracic aorta without dissection or acute aortic findings.  2. Mild dilatation of the main pulmonary artery suggesting pulmonary arterial hypertension. 3. Subpleural reticulation in the dependent and basilar lungs. This may represent hypoventilatory change or can be seen in the setting of interstitial lung disease. This could be further assessed none high-resolution chest CT on an elective basis. 4. Coronary artery calcifications. Aortic Atherosclerosis (ICD10-I70.0). Electronically Signed   By: Keith Rake M.D.   On: 01/12/2020 17:56    ____________________________________________   PROCEDURES  Procedure(s) performed (including Critical Care):  .Critical Care Performed by: Naaman Plummer, MD Authorized by: Naaman Plummer, MD   Critical care provider statement:    Critical care time (minutes):  43   Critical care time was exclusive of:  Separately billable procedures and treating other patients   Critical care was necessary to treat or prevent imminent or life-threatening deterioration of the following conditions:  Cardiac failure   Critical care was time  spent personally by me on the following activities:  Discussions with consultants, evaluation of patient's response to treatment, examination of patient, ordering and performing treatments and interventions, ordering and review of laboratory studies, ordering and review of radiographic studies, pulse oximetry, re-evaluation of patient's condition, obtaining history from patient or surrogate and review of old charts   I assumed direction of critical care for this patient from another provider in my specialty: no   .1-3 Lead EKG Interpretation Performed by: Naaman Plummer, MD Authorized by: Naaman Plummer, MD     Interpretation: normal     ECG rate:  77   ECG rate assessment: normal     Rhythm: sinus rhythm     Ectopy: none     Conduction: normal       ____________________________________________   INITIAL IMPRESSION / ASSESSMENT AND PLAN / ED COURSE  As part of my medical decision making, I reviewed the following data within the [place] notes reviewed and incorporated, Labs reviewed, EKG interpreted, Old chart reviewed, Radiograph reviewed and Notes from prior ED visits reviewed and incorporated        Workup: ECG, CXR, CBC, BMP, Troponin Findings: ECG: No overt evidence of STEMI. No evidence of Brugadas sign, delta wave, epsilon wave, significantly prolonged QTc, or malignant arrhythmia HS Troponin: Negative x1 Other Labs unremarkable for emergent problems. CXR: Without PTX, PNA, or widened mediastinum Last Stress Test: Never Last Heart Catheterization: Never HEART Score: 4  Given History, Exam, and Workup I have low suspicion for ACS, Pneumothorax, Pneumonia, Pulmonary Embolus, Tamponade, Aortic Dissection or other emergent problem as a cause for this presentation.   Patient at increased risk for Major Adverse Cardiac Event (AMI, PCI, CABG, death) Interventions: ASA 324mg  Defer Heparin drip as patient pain free at this time,   Disposition:  Admit for continued cardiac monitoring and trending of troponins as well as further evaluation for potential inpatient stress testing vs cardiac catheterization and coronary angiography.      ____________________________________________   FINAL CLINICAL IMPRESSION(S) / ED DIAGNOSES  Final diagnoses:  NSTEMI (non-ST elevated myocardial infarction) ([place])  Chest wall pain  Left arm pain     ED Discharge Orders    None       Note:  This document was prepared using Dragon voice recognition software and may include unintentional dictation errors.   Naaman Plummer, MD 01/12/20 2111

## 2020-01-12 NOTE — ED Notes (Signed)
Pt verbalized she has taken all her daily medications and does not usually take any medications at night.

## 2020-01-13 ENCOUNTER — Ambulatory Visit (HOSPITAL_COMMUNITY)
Admission: AD | Admit: 2020-01-13 | Discharge: 2020-01-13 | Disposition: A | Discharge status: Home / Self Care | Payer: Medicare PPO | Source: Other Acute Inpatient Hospital | Attending: Internal Medicine | Admitting: Internal Medicine

## 2020-01-13 ENCOUNTER — Observation Stay
Admit: 2020-01-13 | Discharge: 2020-01-13 | Disposition: A | Discharge status: Home / Self Care | Payer: Medicare PPO | Attending: Family Medicine | Admitting: Family Medicine

## 2020-01-13 ENCOUNTER — Encounter
Admission: EM | Disposition: A | Discharge status: Other Acute Inpatient Hospital | Payer: Self-pay | Source: Home / Self Care | Attending: Internal Medicine

## 2020-01-13 DIAGNOSIS — I214 Non-ST elevation (NSTEMI) myocardial infarction: Secondary | ICD-10-CM | POA: Diagnosis present

## 2020-01-13 DIAGNOSIS — N179 Acute kidney failure, unspecified: Secondary | ICD-10-CM | POA: Diagnosis present

## 2020-01-13 DIAGNOSIS — I739 Peripheral vascular disease, unspecified: Secondary | ICD-10-CM | POA: Diagnosis present

## 2020-01-13 DIAGNOSIS — D649 Anemia, unspecified: Secondary | ICD-10-CM | POA: Diagnosis present

## 2020-01-13 DIAGNOSIS — R079 Chest pain, unspecified: Secondary | ICD-10-CM | POA: Diagnosis present

## 2020-01-13 DIAGNOSIS — Z7982 Long term (current) use of aspirin: Secondary | ICD-10-CM | POA: Diagnosis not present

## 2020-01-13 DIAGNOSIS — Z8572 Personal history of non-Hodgkin lymphomas: Secondary | ICD-10-CM | POA: Diagnosis not present

## 2020-01-13 DIAGNOSIS — Z9221 Personal history of antineoplastic chemotherapy: Secondary | ICD-10-CM | POA: Diagnosis not present

## 2020-01-13 DIAGNOSIS — Z8673 Personal history of transient ischemic attack (TIA), and cerebral infarction without residual deficits: Secondary | ICD-10-CM | POA: Diagnosis not present

## 2020-01-13 DIAGNOSIS — F17210 Nicotine dependence, cigarettes, uncomplicated: Secondary | ICD-10-CM | POA: Diagnosis present

## 2020-01-13 DIAGNOSIS — Z20822 Contact with and (suspected) exposure to covid-19: Secondary | ICD-10-CM | POA: Diagnosis present

## 2020-01-13 DIAGNOSIS — Z79899 Other long term (current) drug therapy: Secondary | ICD-10-CM | POA: Diagnosis not present

## 2020-01-13 DIAGNOSIS — R0789 Other chest pain: Secondary | ICD-10-CM | POA: Diagnosis present

## 2020-01-13 DIAGNOSIS — I2510 Atherosclerotic heart disease of native coronary artery without angina pectoris: Secondary | ICD-10-CM | POA: Diagnosis present

## 2020-01-13 DIAGNOSIS — Z833 Family history of diabetes mellitus: Secondary | ICD-10-CM | POA: Diagnosis not present

## 2020-01-13 DIAGNOSIS — I745 Embolism and thrombosis of iliac artery: Secondary | ICD-10-CM | POA: Diagnosis present

## 2020-01-13 DIAGNOSIS — D72829 Elevated white blood cell count, unspecified: Secondary | ICD-10-CM | POA: Diagnosis present

## 2020-01-13 DIAGNOSIS — E785 Hyperlipidemia, unspecified: Secondary | ICD-10-CM | POA: Diagnosis present

## 2020-01-13 DIAGNOSIS — I493 Ventricular premature depolarization: Secondary | ICD-10-CM | POA: Diagnosis present

## 2020-01-13 DIAGNOSIS — I10 Essential (primary) hypertension: Secondary | ICD-10-CM | POA: Diagnosis present

## 2020-01-13 HISTORY — PX: LEFT HEART CATH AND CORONARY ANGIOGRAPHY: CATH118249

## 2020-01-13 LAB — CBC
HCT: 25.5 % — ABNORMAL LOW (ref 36.0–46.0)
Hemoglobin: 9.3 g/dL — ABNORMAL LOW (ref 12.0–15.0)
MCH: 33 pg (ref 26.0–34.0)
MCHC: 36.5 g/dL — ABNORMAL HIGH (ref 30.0–36.0)
MCV: 90.4 fL (ref 80.0–100.0)
Platelets: 241 10*3/uL (ref 150–400)
RBC: 2.82 MIL/uL — ABNORMAL LOW (ref 3.87–5.11)
RDW: 14.2 % (ref 11.5–15.5)
WBC: 11.5 10*3/uL — ABNORMAL HIGH (ref 4.0–10.5)
nRBC: 0 % (ref 0.0–0.2)

## 2020-01-13 LAB — LIPID PANEL
Cholesterol: 130 mg/dL (ref 0–200)
HDL: 43 mg/dL (ref >40)
LDL Cholesterol: 70 mg/dL (ref 0–99)
Total CHOL/HDL Ratio: 3 RATIO
Triglycerides: 83 mg/dL (ref <150)
VLDL: 17 mg/dL (ref 0–40)

## 2020-01-13 LAB — IRON AND TIBC
Iron: 18 ug/dL — ABNORMAL LOW (ref 28–170)
Saturation Ratios: 10 % — ABNORMAL LOW (ref 10.4–31.8)
TIBC: 190 ug/dL — ABNORMAL LOW (ref 250–450)
UIBC: 172 ug/dL

## 2020-01-13 LAB — ECHOCARDIOGRAM COMPLETE
Height: 63 in
S' Lateral: 3.07 cm
Weight: 2048 oz

## 2020-01-13 LAB — VITAMIN B12: Vitamin B-12: 337 pg/mL (ref 180–914)

## 2020-01-13 LAB — FOLATE: Folate: 8.8 ng/mL (ref >5.9)

## 2020-01-13 LAB — HEPARIN LEVEL (UNFRACTIONATED): Heparin Unfractionated: 0.23 IU/mL — ABNORMAL LOW (ref 0.30–0.70)

## 2020-01-13 SURGERY — LEFT HEART CATH AND CORONARY ANGIOGRAPHY
Anesthesia: Moderate Sedation

## 2020-01-13 MED ORDER — MIDAZOLAM HCL 2 MG/2ML IJ SOLN
INTRAMUSCULAR | Status: AC
  Filled 2020-01-13: qty 2

## 2020-01-13 MED ORDER — SODIUM CHLORIDE 0.9% FLUSH: 3.0000 mL | INTRAVENOUS | Status: DC | PRN

## 2020-01-13 MED ORDER — SODIUM CHLORIDE 0.9 % IV SOLN: 250.0000 mL | INTRAVENOUS | Status: DC | PRN

## 2020-01-13 MED ORDER — FENTANYL CITRATE (PF) 100 MCG/2ML IJ SOLN
INTRAMUSCULAR | Status: DC | PRN
Start: 2020-01-13 — End: 2020-01-13
  Administered 2020-01-13 (×3): 25 ug via INTRAVENOUS

## 2020-01-13 MED ORDER — SODIUM CHLORIDE 0.9 % WEIGHT BASED INFUSION: 1.0000 mL/kg/h | INTRAVENOUS | Status: DC

## 2020-01-13 MED ORDER — SODIUM CHLORIDE 0.9 % WEIGHT BASED INFUSION
3.0000 mL/kg/h | INTRAVENOUS | Status: DC
  Administered 2020-01-13: 3 mL/kg/h via INTRAVENOUS

## 2020-01-13 MED ORDER — HEPARIN BOLUS VIA INFUSION
1000.0000 [IU] | Freq: Once | INTRAVENOUS | Status: AC
  Administered 2020-01-13: 1000 [IU] via INTRAVENOUS
  Filled 2020-01-13: qty 1000

## 2020-01-13 MED ORDER — HEPARIN SODIUM (PORCINE) 1000 UNIT/ML IJ SOLN
INTRAMUSCULAR | Status: AC
  Filled 2020-01-13: qty 1

## 2020-01-13 MED ORDER — ASPIRIN 81 MG PO CHEW: 81.0000 mg | CHEWABLE_TABLET | ORAL | Status: DC

## 2020-01-13 MED ORDER — HYDRALAZINE HCL 20 MG/ML IJ SOLN: 10.0000 mg | INTRAMUSCULAR | Status: DC | PRN

## 2020-01-13 MED ORDER — ROSUVASTATIN CALCIUM 10 MG PO TABS: 20.0000 mg | ORAL_TABLET | Freq: Every day | ORAL | Status: DC

## 2020-01-13 MED ORDER — VERAPAMIL HCL 2.5 MG/ML IV SOLN
INTRAVENOUS | Status: AC
  Filled 2020-01-13: qty 2

## 2020-01-13 MED ORDER — SODIUM CHLORIDE 0.9% FLUSH: 3.0000 mL | Freq: Two times a day (BID) | INTRAVENOUS | Status: DC

## 2020-01-13 MED ORDER — ONDANSETRON HCL 4 MG/2ML IJ SOLN: 4.0000 mg | Freq: Four times a day (QID) | INTRAMUSCULAR | Status: DC | PRN

## 2020-01-13 MED ORDER — HEPARIN SODIUM (PORCINE) 1000 UNIT/ML IJ SOLN
INTRAMUSCULAR | Status: DC | PRN
  Administered 2020-01-13: 2000 [IU] via INTRAVENOUS

## 2020-01-13 MED ORDER — IOHEXOL 300 MG/ML  SOLN
INTRAMUSCULAR | Status: DC | PRN
  Administered 2020-01-13: 85 mL

## 2020-01-13 MED ORDER — HEPARIN (PORCINE) IN NACL 1000-0.9 UT/500ML-% IV SOLN
INTRAVENOUS | Status: AC
  Filled 2020-01-13: qty 1000

## 2020-01-13 MED ORDER — MIDAZOLAM HCL 2 MG/2ML IJ SOLN
INTRAMUSCULAR | Status: DC | PRN
  Administered 2020-01-13: 1 mg via INTRAVENOUS
  Administered 2020-01-13 (×2): 0.5 mg via INTRAVENOUS

## 2020-01-13 MED ORDER — HEPARIN (PORCINE) IN NACL 1000-0.9 UT/500ML-% IV SOLN
INTRAVENOUS | Status: DC | PRN
  Administered 2020-01-13: 1000 mL

## 2020-01-13 MED ORDER — FENTANYL CITRATE (PF) 100 MCG/2ML IJ SOLN
INTRAMUSCULAR | Status: AC
  Filled 2020-01-13: qty 2

## 2020-01-13 MED ORDER — HEPARIN (PORCINE) 25000 UT/250ML-% IV SOLN
850.0000 [IU]/h | INTRAVENOUS | Status: DC
  Administered 2020-01-13: 850 [IU]/h via INTRAVENOUS

## 2020-01-13 MED ORDER — VERAPAMIL HCL 2.5 MG/ML IV SOLN
INTRAVENOUS | Status: DC | PRN
  Administered 2020-01-13: 2.5 mg via INTRAVENOUS

## 2020-01-13 MED ORDER — LABETALOL HCL 5 MG/ML IV SOLN: 10.0000 mg | INTRAVENOUS | Status: DC | PRN

## 2020-01-13 MED ORDER — ACETAMINOPHEN 325 MG PO TABS: 650.0000 mg | ORAL_TABLET | ORAL | Status: DC | PRN

## 2020-01-13 SURGICAL SUPPLY — 13 items
CATH INFINITI 5FR ANG PIGTAIL (CATHETERS) ×3 IMPLANT
CATH INFINITI 5FR JL4 (CATHETERS) ×3 IMPLANT
CATH INFINITI JR4 5F (CATHETERS) ×3 IMPLANT
DEVICE RAD TR BAND REGULAR (VASCULAR PRODUCTS) ×3 IMPLANT
GLIDESHEATH SLEND SS 6F .021 (SHEATH) ×3 IMPLANT
GUIDEWIRE INQWIRE 1.5J.035X260 (WIRE) ×1 IMPLANT
INQWIRE 1.5J .035X260CM (WIRE) ×3
KIT MANI 3VAL PERCEP (MISCELLANEOUS) ×3 IMPLANT
NEEDLE PERC 18GX7CM (NEEDLE) ×3 IMPLANT
PACK CARDIAC CATH (CUSTOM PROCEDURE TRAY) ×3 IMPLANT
SHEATH AVANTI 5FR X 11CM (SHEATH) ×3 IMPLANT
WIRE GUIDERIGHT .035X150 (WIRE) ×3 IMPLANT
WIRE HITORQ VERSACORE ST 145CM (WIRE) ×3 IMPLANT

## 2020-01-13 NOTE — Progress Notes (Signed)
Patient had cardiac catheterization for chest pain and elevated troponin.  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  Will send the films to [facility] and evaluate for CABG.  Patient also has severe peripheral vascular disease with right groin access achieved but had occluded distal common iliac.  Thus will patient under underwent through right great radial catheterization.  Patient tolerated the procedure well.

## 2020-01-13 NOTE — Consult Note (Signed)
[facility] Consultation Note  Patient ID: Ashley Lynch, MRN: [id number], DOB/AGE: December 20, 1945 74 y.o. Admit date: 01/12/2020   Date of Consult: 01/13/2020 Primary Physician: Perrin Maltese, MD Primary Cardiologist: None  Chief Complaint:  Chief Complaint  Patient presents with  . Headache  . Chest Pain  . Neck Pain  . Arm Pain   Reason for Consult: Chest pain  HPI: 74 y.o. female with known tobacco use hypertension and hyperlipidemia who has had significant onset of waxing and waning chest pain over the last 12 to 24 hours.  When seen in the emergency room the patient had elevated troponin up to 660 with an EKG showing nonspecific ST changes and PVCs.  The patient had full release from her chest discomfort with heparin and nitroglycerin and other medication management.  Currently she is hemodynamically stable  Past Medical History:  Diagnosis Date  . Cancer ([place])    lymphoma  . History of chicken pox   . History of measles, mumps, or rubella   . HTN (hypertension)   . Lymphoma in remission [facility])       Surgical History:  Past Surgical History:  Procedure Laterality Date  . ABDOMINAL HYSTERECTOMY  2013  . [place]  . TONSILLECTOMY  2010     Home Meds: Prior to Admission medications   Medication Sig Start Date End Date Taking? Authorizing Provider  amLODipine (NORVASC) 5 MG tablet Take 1 tablet each morning 06/19/16  Yes Arlis Porta., MD  aspirin 81 MG tablet Take 81 mg by mouth daily.   Yes [provider]  benazepril (LOTENSIN) 20 MG tablet Take 20 mg by mouth daily. 01/08/20  Yes [provider]  furosemide (LASIX) 20 MG tablet Take 20 mg by mouth daily. 12/11/19  Yes [provider]  omeprazole (PRILOSEC) 20 MG capsule Take 1 capsule (20 mg total) by mouth daily. 11/27/16  Yes Karamalegos, Devonne Doughty, DO  rosuvastatin (CRESTOR) 10 MG tablet Take 10 mg by mouth daily. 09/02/19  Yes [provider]   vitamin B-12 (CYANOCOBALAMIN) 1000 MCG tablet Take 1,000 mcg by mouth daily.   Yes [provider]  fluticasone Asencion Islam) 50 MCG/ACT nasal spray instill 2 sprays into each nostril once daily 01/11/16   Arlis Porta., MD  ondansetron (ZOFRAN ODT) 4 MG disintegrating tablet Take 1 tablet (4 mg total) by mouth every 8 (eight) hours as needed. Patient not taking: Reported on 01/12/2020 10/20/19   Menshew, Dannielle Karvonen, PA-C  polyethylene glycol (MIRALAX / GLYCOLAX) 17 g packet Take 17 g by mouth daily. Mix one tablespoon with 8oz of your favorite juice or water every day until you are having soft formed stools. Then start taking once daily if you didn't have a stool the day before. Patient not taking: Reported on 01/12/2020 10/29/19   Arta Silence, MD  traMADol (ULTRAM) 50 MG tablet Take 50 mg by mouth 3 (three) times daily as needed. 12/25/19   [provider]    Inpatient Medications:  . amLODipine  5 mg Oral Daily  . aspirin EC  81 mg Oral Daily  . pantoprazole  40 mg Oral Daily  . rosuvastatin  10 mg Oral Daily  . vitamin B-12  1,000 mcg Oral Daily   . heparin 850 Units/hr (01/13/20 3329)    Allergies: No Known Allergies  Social History   Socioeconomic History  . Marital status: Widowed    Spouse name: Not on file  .  Number of children: Not on file  . Years of education: Not on file  . Highest education level: Not on file  Occupational History  . Not on file  Tobacco Use  . Smoking status: Current Every Day Smoker    Packs/day: 1.00    Years: 52.00    Pack years: 52.00    Types: Cigarettes  . Smokeless tobacco: Current User  Substance and Sexual Activity  . Alcohol use: No  . Drug use: No  . Sexual activity: Not on file  Other Topics Concern  . Not on file  Social History Narrative  . Not on file   Social Determinants of Health   Financial Resource Strain:   . Difficulty of Paying Living Expenses: Not on file  Food Insecurity:   .  Worried About Charity fundraiser in the Last Year: Not on file  . Ran Out of Food in the Last Year: Not on file  Transportation Needs:   . Lack of Transportation (Medical): Not on file  . Lack of Transportation (Non-Medical): Not on file  Physical Activity:   . Days of Exercise per Week: Not on file  . Minutes of Exercise per Session: Not on file  Stress:   . Feeling of Stress : Not on file  Social Connections:   . Frequency of Communication with Friends and Family: Not on file  . Frequency of Social Gatherings with Friends and Family: Not on file  . Attends Religious Services: Not on file  . Active Member of Clubs or Organizations: Not on file  . Attends Archivist Meetings: Not on file  . Marital Status: Not on file  Intimate Partner Violence:   . Fear of Current or Ex-Partner: Not on file  . Emotionally Abused: Not on file  . Physically Abused: Not on file  . Sexually Abused: Not on file     Family History  Problem Relation Age of Onset  . Diabetes Mother        sibling and grandparent also     Review of Systems Positive for chest pain Negative for: General:  chills, fever, night sweats or weight changes.  Cardiovascular: PND orthopnea syncope dizziness  Dermatological skin lesions rashes Respiratory: Cough congestion Urologic: Frequent urination urination at night and hematuria Abdominal: negative for nausea, vomiting, diarrhea, bright red blood per rectum, melena, or hematemesis Neurologic: negative for visual changes, and/or hearing changes  All other systems reviewed and are otherwise negative except as noted above.  Labs: No results for input(s): CKTOTAL, CKMB, TROPONINI in the last 72 hours. Lab Results  Component Value Date   WBC 11.5 (H) 01/13/2020   HGB 9.3 (L) 01/13/2020   HCT 25.5 (L) 01/13/2020   MCV 90.4 01/13/2020   PLT 241 01/13/2020    Recent Labs  Lab 01/12/20 1348  NA 136  K 3.8  CL 101  CO2 24  BUN 22  CREATININE 1.21*   CALCIUM 9.4  GLUCOSE 121*   Lab Results  Component Value Date   CHOL 130 01/13/2020   HDL 43 01/13/2020   LDLCALC 70 01/13/2020   TRIG 83 01/13/2020   No results found for: DDIMER  Radiology/Studies:  DG Chest 2 View  Result Date: 01/12/2020 CLINICAL DATA:  Acute onset chest, neck, back and arm pain today. EXAM: CHEST - 2 VIEW COMPARISON:  PA and lateral chest 10/21/2008. FINDINGS: Port-A-Cath present on the prior exam has been removed. Lungs are clear. Heart size is normal. No pneumothorax or  pleural fluid. No acute or focal bony abnormality. IMPRESSION: No acute disease. Electronically Signed   By: Inge Rise M.D.   On: 01/12/2020 14:15   CT Angio Chest Aorta w/CM &/OR wo/CM  Result Date: 01/12/2020 CLINICAL DATA:  Chest pain or back pain, aortic dissection suspected EXAM: CT ANGIOGRAPHY CHEST WITH CONTRAST TECHNIQUE: Precontrast images were obtained. Multidetector CT imaging of the chest was performed using the standard protocol during bolus administration of intravenous contrast. Multiplanar CT image reconstructions and MIPs were obtained to evaluate the vascular anatomy. CONTRAST:  39mL OMNIPAQUE IOHEXOL 350 MG/ML SOLN COMPARISON:  Radiograph earlier today. FINDINGS: Cardiovascular: No aortic hematoma on noncontrast exam. There is no aortic dissection. Advanced calcified noncalcified atheromatous plaque throughout the thoracic aorta. There is no periaortic stranding. No evidence of penetrating ulcer or acute aortic findings. Descending aortic tortuosity. No aortic aneurysm. Conventional branching pattern from the aortic arch. There is just over 50% luminal narrowing of the origin of the left common carotid artery due to atheromatous plaque. Branch vessels otherwise patent. Mild dilatation of the main pulmonary artery at 3.4 cm. There are no obvious pulmonary arterial lung filling defects, evaluation at tailored to pulmonary artery evaluation. Only the main pulmonary artery is well  evaluated. There are coronary artery calcifications. Heart size upper normal. No pericardial effusion. Mediastinum/Nodes: Shotty mediastinal lymph nodes not enlarged by size criteria. There is no hilar adenopathy. No esophageal wall thickening. No thyroid nodule. Lungs/Pleura: Subpleural reticulation within the dependent and basilar lungs. Mild bronchiolectasis in the right lower lobe. No acute or confluent airspace disease. No findings of pulmonary edema. No pleural fluid. Trachea and central bronchi are patent. There is no pulmonary mass or dominant nodule. Upper Abdomen: Chronic pneumobilia, unchanged from 03/14/2017 abdominal CT. No acute findings. Musculoskeletal: There are no acute or suspicious osseous abnormalities. Review of the MIP images confirms the above findings. IMPRESSION: 1. Diffuse calcified and noncalcified atheromatous plaque throughout the thoracic aorta without dissection or acute aortic findings. 2. Mild dilatation of the main pulmonary artery suggesting pulmonary arterial hypertension. 3. Subpleural reticulation in the dependent and basilar lungs. This may represent hypoventilatory change or can be seen in the setting of interstitial lung disease. This could be further assessed none high-resolution chest CT on an elective basis. 4. Coronary artery calcifications. Aortic Atherosclerosis (ICD10-I70.0). Electronically Signed   By: Keith Rake M.D.   On: 01/12/2020 17:56    EKG: Normal sinus rhythm with nonspecific ST changes and PVCs  Weights: Filed Weights   01/12/20 1335  Weight: 58.1 kg     Physical Exam: Blood pressure 126/69, pulse 71, temperature 99 F (37.2 C), temperature source Oral, resp. rate 20, height 5\' 3"  (1.6 m), weight 58.1 kg, SpO2 97 %. Body mass index is 22.67 kg/m. General: Well developed, well nourished, in no acute distress. Head eyes ears nose throat: Normocephalic, atraumatic, sclera non-icteric, no xanthomas, nares are without discharge. No  apparent thyromegaly and/or mass  Lungs: Normal respiratory effort.  no wheezes, no rales, no rhonchi.  Heart: RRR with normal S1 S2. no murmur gallop, no rub, PMI is normal size and placement, carotid upstroke normal without bruit, jugular venous pressure is normal Abdomen: Soft, non-tender, non-distended with normoactive bowel sounds. No hepatomegaly. No rebound/guarding. No obvious abdominal masses. Abdominal aorta is normal size without bruit Extremities: No edema. no cyanosis, no clubbing, no ulcers  Peripheral : 2+ bilateral upper extremity pulses, 2+ bilateral femoral pulses, 2+ bilateral dorsal pedal pulse Neuro: Alert and oriented. No facial asymmetry.  No focal deficit. Moves all extremities spontaneously. Musculoskeletal: Normal muscle tone without kyphosis Psych:  Responds to questions appropriately with a normal affect.    Assessment: 74 year old female with hypertension hyperlipidemia tobacco abuse with a non-ST elevation myocardial infarction and no current evidence of congestive heart failure  Plan: 1.  Continue heparin for further risk reduction and myocardial infarction along with aspirin. 2.  Continue treatment of chest discomfort with nitrates 3.  Beta-blocker as able for non-ST elevation myocardial infarction 4.  Echocardiogram for LV systolic dysfunction valvular heart disease contributing to above 5.  Proceed to cardiac catheterization to assess coronary anatomy and further treatment thereof as necessary.  Patient understands the risk and benefits of cardiac catheterization.  This includes the possibility of death stroke heart attack infection bleeding or blood clot.  The patient is at low risk for conscious sedation  Signed, Corey Skains M.D. [facility] 01/13/2020, 8:39 AM

## 2020-01-13 NOTE — Consult Note (Signed)
ANTICOAGULATION CONSULT NOTE -  Pharmacy Consult for Heparin Infusion Indication: chest pain/ACS  No Known Allergies  Patient Measurements: Height: 5\' 3"  (160 cm) Weight: 58.1 kg (128 lb) IBW/kg (Calculated) : 52.4 Heparin Dosing Weight: 58.1 kg  Labs: Recent Labs    01/12/20 1348 01/12/20 1348 01/12/20 1548 01/12/20 2108 01/12/20 2316 01/13/20 0531  HGB 10.8*  --   --   --   --  9.3*  HCT 30.8*  --   --   --   --  25.5*  PLT 260  --   --   --   --  241  APTT  --   --   --  30  --   --   LABPROT  --   --   --  13.6  --   --   INR  --   --   --  1.1  --   --   HEPARINUNFRC  --   --   --   --   --  0.23*  CREATININE 1.21*  --   --   --   --   --   TROPONINIHS 660*   < > 599* 600* 460*  --    < > = values in this interval not displayed.    Estimated Creatinine Clearance: 33.7 mL/min (A) (by C-G formula based on SCr of 1.21 mg/dL (H)).   Medical History: Past Medical History:  Diagnosis Date  . Cancer ([place])    lymphoma  . History of chicken pox   . History of measles, mumps, or rubella   . HTN (hypertension)   . Lymphoma in remission [facility])     Medications:  No anticoagulation prior to admission per chart review  Assessment: Patient is a 74 y/o F with medical history as above who presented to the ED 9/27 with chief complaint of chest pain. CT negative for aortic dissection. Troponin elevated (660 >> 599). Pharmacy has been consulted to initiate heparin infusion for ACS.  Baseline CBC with Hgb 10.8 (last was 11.5 on 10/29/19). Platelets within normal limits. Baseline aPTT and PT-INR pending. Heparin infusion was initiated at 700 units/hr.   0928 @ 0531 HL 0.23, SUBtherapeutic, H/H worse, PLTs OK. Infusion increased to 850 units/hr.   Goal of Therapy:  Heparin level 0.3-0.7 units/ml Monitor platelets by anticoagulation protocol: Yes   Plan:  Patient now S/P Cardiac catheterization. Per Dr. Humphrey Rolls restart heparin infusion @ 1700 this evening.  Will plan to start  heparin infusion @ 850 units/hr. Recheck HL ~8 hours after heparin infusion restarted.   --Daily CBC per protocol  Pernell Dupre, PharmD, BCPS Clinical Pharmacist 01/13/2020 2:27 PM

## 2020-01-13 NOTE — Consult Note (Signed)
ANTICOAGULATION CONSULT NOTE -  Pharmacy Consult for Heparin Infusion Indication: chest pain/ACS  No Known Allergies  Patient Measurements: Height: 5\' 3"  (160 cm) Weight: 58.1 kg (128 lb) IBW/kg (Calculated) : 52.4 Heparin Dosing Weight: 58.1 kg  Labs: Recent Labs    01/12/20 1348 01/12/20 1348 01/12/20 1548 01/12/20 2108 01/12/20 2316 01/13/20 0531  HGB 10.8*  --   --   --   --  9.3*  HCT 30.8*  --   --   --   --  25.5*  PLT 260  --   --   --   --  241  APTT  --   --   --  30  --   --   LABPROT  --   --   --  13.6  --   --   INR  --   --   --  1.1  --   --   HEPARINUNFRC  --   --   --   --   --  0.23*  CREATININE 1.21*  --   --   --   --   --   TROPONINIHS 660*   < > 599* 600* 460*  --    < > = values in this interval not displayed.    Estimated Creatinine Clearance: 33.7 mL/min (A) (by C-G formula based on SCr of 1.21 mg/dL (H)).   Medical History: Past Medical History:  Diagnosis Date  . Cancer ([place])    lymphoma  . History of chicken pox   . History of measles, mumps, or rubella   . HTN (hypertension)   . Lymphoma in remission [facility])     Medications:  No anticoagulation prior to admission per chart review  Assessment: Patient is a 75 y/o F with medical history as above who presented to the ED 9/27 with chief complaint of chest pain. CT negative for aortic dissection. Troponin elevated (660 >> 599). Pharmacy has been consulted to initiate heparin infusion for ACS.  Baseline CBC with Hgb 10.8 (last was 11.5 on 10/29/19). Platelets within normal limits. Baseline aPTT and PT-INR pending.  5597 @ 0531 HL 0.23, SUBtherapeutic, H/H worse, PLTs OK  Goal of Therapy:  Heparin level 0.3-0.7 units/ml Monitor platelets by anticoagulation protocol: Yes   Plan:  --Heparin 1000 unit IV rebolus x 1 and increase infusion to 850 units/hr --Recheck HL ~8 hours after increase --Daily CBC per protocol  Hart Robinsons A 01/13/2020,6:32 AM

## 2020-01-13 NOTE — Progress Notes (Addendum)
PROGRESS NOTE    Ashley Lynch  ESP:[id number] DOB: [birth year] DOA: 01/12/2020  PCP: Perrin Maltese, MD   Brief Narrative:  HPI: Ashley Lynch is a 74 y.o. female with medical history significant for history of lymphoma s/p tonsillectomy and chemotherapy in remission, hypertension, PVD, TIA who presents with concerns of headache and chest pain.  Patient states that she developed a headache that she describes as soreness last night on the top and back of her head.  This persisted when she awoke this morning when she was working on her computer when she also developed anterior pressure chest pain that radiated to either side of her neck and then to her right shoulder and right arm.  States pain still persisted until she got to the ED where she received medication.  She also noted increasing shortness of breath last night but denies any wheezing.  She is a current half a pack to a pack smoker and has been doing it for 50 years.  She denies any cough or fever but has been having runny nose that she thinks is due to allergies.  She also notes heartburn today but has been compliant with taking her PPI daily. No trauma or heavy lifting.No new meds other than taking Benadryl recently for her sleep and allergies.  ED Course: She had temperature of 65F, intermittently bradycardic at times and normotensive on room air. Lab work is significant for elevated troponin of 660 that later trended down to 599.  EKG is similar to prior except more frequent PVCs.  No significant ST or T wave changes.  CBC that showed mild leukocytosis of 13.9, stable normocytic anemia with hemoglobin of 10.8.  Creatinine elevated at 1.21 from a prior of 1.17 a few months ago.  CT angio chest did not show any dissection.  She was given 30 mg of Toradol and in the ED.  Assessment & Plan: Principal Problem:   Chest pain Active Problems:   Essential hypertension   AKI (acute kidney injury) ([place])   Anemia   Chest  pain Patient presented with chest pain with radiation of bilateral neck and right upper extremity relieved with Toradol and Nitropaste. Initial troponin of 660 that downward trended to 599.  Will continue to trend to see if it is a downward trend.  No other clear explanation of her elevated troponin.  She does have mildly elevated AKI but would not suspect that it would elevate her troponin this much. Has risk factors with HTN, current tobacco use, and significant atherosclerotic plaque seen on CTA. will start Heparin gtt for now for NSTEMI.  Obtain echo in the morning. Last echo in 12/2017 with EF of 55-60% with normal diastolic function  She also has noted leukocytosis but currently afebrile and no findings  on CTA. If she develops fever, low threshold to treat for community acquired pneumonia. Check PCT.  Trial Maalox. Pt has positive cardiac cath, she has heart disease and PAD and needs procedure will d/w cardiologist about plan for transfer .  AKI creatinine of 1.21 from prior of 1.17 give continous IV fluids for 8 hrs and recheck avoid nephrotoxic agent  Anemia Hgb of 10.8 from prior of 11.5. No overt bleeding check iron, vitamin Q33 and folic- she also endorse peripheral neuropathy  Hx of HTN Hold benazepril, Lasix due to AKI.  Continue amlodipine  DVT prophylaxis: Heparin gtt Code Status: Full Family Communication: Plan discussed with patient and granddaughter at bedside  disposition Plan: Home with observation  Consults called:  Admission status: Inpatient Status is: Inpatient  Remains inpatient appropriate because:Inpatient level of care appropriate due to severity of illness   Dispo: The patient is from: Home              Anticipated d/c is to: Home              Anticipated d/c date is: > 3 days              Patient currently is not medically stable to d/c.  Consultants:  Cardiology: Dr.Kowalski.  Procedures:  Left heart cath.  Subjective: Pt admitted with  chest pain and found to have pos trop and started on heparin drip we have consulted cardiology and pt is post cath found to need CABG.  Objective: Vitals:   01/13/20 1345 01/13/20 1400 01/13/20 1415 01/13/20 1430  BP: (!) 173/59 (!) 168/52 (!) 177/71 (!) 172/76  Pulse: 60 62 69 79  Resp: 16 15 18 16   Temp:      TempSrc:      SpO2: 97% 97% 97% 98%  Weight:      Height:       SpO2: 98 % O2 Flow Rate (L/min): 2 L/min No intake or output data in the 24 hours ending 01/13/20 1454 Filed Weights   01/12/20 1335 01/13/20 1124  Weight: 58.1 kg 58.1 kg    Examination: Blood pressure (!) 172/76, pulse 79, temperature 98.4 F (36.9 C), temperature source Oral, resp. rate 16, height 5\' 3"  (1.6 m), weight 58.1 kg, SpO2 98 %. General exam: Appears calm and comfortable  HEENT:EOMI, perrl  Respiratory system: Clear to auscultation. Respiratory effort normal. Cardiovascular system: S1 & S2 heard, RRR. No JVD, murmurs, rubs, gallops or clicks. No pedal edema. Gastrointestinal system: Abdomen is nondistended, soft and nontender. No organomegaly or masses felt. Normal bowel sounds heard. Central nervous system: Alert and oriented.CN grossly intact No focal neurological deficits. Extremities: pt moving all 4 ext and ambulating. Skin: No rashes, lesions or ulcers Psychiatry: Judgement and insight appear normal. Mood & affect appropriate.   Data Reviewed: I have personally reviewed following labs and imaging studies  No intake/output data recorded. No intake/output data recorded. Lab Results  Component Value Date   CREATININE 1.21 (H) 01/12/2020   CREATININE 1.17 (H) 10/29/2019   CREATININE 1.07 (H) 06/28/2017   CBC: Recent Labs  Lab 01/12/20 1348 01/13/20 0531  WBC 13.9* 11.5*  HGB 10.8* 9.3*  HCT 30.8* 25.5*  MCV 89.3 90.4  PLT 260 130   Basic Metabolic Panel: Recent Labs  Lab 01/12/20 1348  NA 136  K 3.8  CL 101  CO2 24  GLUCOSE 121*  BUN 22  CREATININE 1.21*  CALCIUM  9.4   GFR: Estimated Creatinine Clearance: 33.7 mL/min (A) (by C-G formula based on SCr of 1.21 mg/dL (H)). Liver Function Tests: No results for input(s): AST, ALT, ALKPHOS, BILITOT, PROT, ALBUMIN in the last 168 hours. No results for input(s): LIPASE, AMYLASE in the last 168 hours. No results for input(s): AMMONIA in the last 168 hours.  Coagulation Profile: Recent Labs  Lab 01/12/20 2108  INR 1.1   Cardiac Enzymes: No results for input(s): CKTOTAL, CKMB, CKMBINDEX, TROPONINI in the last 168 hours. BNP (last 3 results) No results for input(s): PROBNP in the last 8760 hours. HbA1C: No results for input(s): HGBA1C in the last 72 hours. CBG: No results for input(s): GLUCAP in the last 168 hours. Lipid Profile: Recent Labs    01/13/20 0531  CHOL 130  HDL 43  LDLCALC 70  TRIG 83  CHOLHDL 3.0   Thyroid Function Tests: No results for input(s): TSH, T4TOTAL, FREET4, T3FREE, THYROIDAB in the last 72 hours. Anemia Panel: Recent Labs    01/13/20 0531  VITAMINB12 337  FOLATE 8.8  TIBC 190*  IRON 18*   Sepsis Labs: Recent Labs  Lab 01/12/20 2108  PROCALCITON 1.38    Recent Results (from the past 240 hour(s))  Respiratory Panel by RT PCR (Flu A&B, Covid) - Nasopharyngeal Swab     Status: None   Collection Time: 01/12/20  7:16 PM   Specimen: Nasopharyngeal Swab  Result Value Ref Range Status   SARS Coronavirus 2 by RT PCR NEGATIVE NEGATIVE Final    Comment: (NOTE) SARS-CoV-2 target nucleic acids are NOT DETECTED.  The SARS-CoV-2 RNA is generally detectable in upper respiratoy specimens during the acute phase of infection. The lowest concentration of SARS-CoV-2 viral copies this assay can detect is 131 copies/mL. A negative result does not preclude SARS-Cov-2 infection and should not be used as the sole basis for treatment or other patient management decisions. A negative result may occur with  improper specimen collection/handling, submission of specimen  other than nasopharyngeal swab, presence of viral mutation(s) within the areas targeted by this assay, and inadequate number of viral copies (<131 copies/mL). A negative result must be combined with clinical observations, patient history, and epidemiological information. The expected result is Negative.  Fact Sheet for Patients:  PinkCheek.be  Fact Sheet for Healthcare Providers:  GravelBags.it  This test is no t yet approved or cleared by the [place] FDA and  has been authorized for detection and/or diagnosis of SARS-CoV-2 by FDA under an Emergency Use Authorization (EUA). This EUA will remain  in effect (meaning this test can be used) for the duration of the COVID-19 declaration under Section 564(b)(1) of the Act, 21 U.S.C. section 360bbb-3(b)(1), unless the authorization is terminated or revoked sooner.     Influenza A by PCR NEGATIVE NEGATIVE Final   Influenza B by PCR NEGATIVE NEGATIVE Final    Comment: (NOTE) The Xpert Xpress SARS-CoV-2/FLU/RSV assay is intended as an aid in  the diagnosis of influenza from Nasopharyngeal swab specimens and  should not be used as a sole basis for treatment. Nasal washings and  aspirates are unacceptable for Xpert Xpress SARS-CoV-2/FLU/RSV  testing.  Fact Sheet for Patients: PinkCheek.be  Fact Sheet for Healthcare Providers: GravelBags.it  This test is not yet approved or cleared by the [place] FDA and  has been authorized for detection and/or diagnosis of SARS-CoV-2 by  FDA under an Emergency Use Authorization (EUA). This EUA will remain  in effect (meaning this test can be used) for the duration of the  Covid-19 declaration under Section 564(b)(1) of the Act, 21  U.S.C. section 360bbb-3(b)(1), unless the authorization is  terminated or revoked. Performed at [facility], [address]  [place], [place] [address]     COVID-19 Labs  No results for input(s): DDIMER, FERRITIN, LDH, CRP in the last 72 hours.  Lab Results  Component Value Date   [place] NEGATIVE 01/12/2020   Radiology Studies: DG Chest 2 View  Result Date: 01/12/2020 CLINICAL DATA:  Acute onset chest, neck, back and arm pain today. EXAM: CHEST - 2 VIEW COMPARISON:  PA and lateral chest 10/21/2008. FINDINGS: Port-A-Cath present on the prior exam has been removed. Lungs are clear. Heart size is normal. No pneumothorax or pleural fluid. No acute or focal bony  abnormality. IMPRESSION: No acute disease. Electronically Signed   By: Inge Rise M.D.   On: 01/12/2020 14:15   CT Angio Chest Aorta w/CM &/OR wo/CM  Result Date: 01/12/2020 CLINICAL DATA:  Chest pain or back pain, aortic dissection suspected EXAM: CT ANGIOGRAPHY CHEST WITH CONTRAST TECHNIQUE: Precontrast images were obtained. Multidetector CT imaging of the chest was performed using the standard protocol during bolus administration of intravenous contrast. Multiplanar CT image reconstructions and MIPs were obtained to evaluate the vascular anatomy. CONTRAST:  45mL OMNIPAQUE IOHEXOL 350 MG/ML SOLN COMPARISON:  Radiograph earlier today. FINDINGS: Cardiovascular: No aortic hematoma on noncontrast exam. There is no aortic dissection. Advanced calcified noncalcified atheromatous plaque throughout the thoracic aorta. There is no periaortic stranding. No evidence of penetrating ulcer or acute aortic findings. Descending aortic tortuosity. No aortic aneurysm. Conventional branching pattern from the aortic arch. There is just over 50% luminal narrowing of the origin of the left common carotid artery due to atheromatous plaque. Branch vessels otherwise patent. Mild dilatation of the main pulmonary artery at 3.4 cm. There are no obvious pulmonary arterial lung filling defects, evaluation at tailored to pulmonary artery evaluation. Only the main pulmonary artery is well  evaluated. There are coronary artery calcifications. Heart size upper normal. No pericardial effusion. Mediastinum/Nodes: [place] mediastinal lymph nodes not enlarged by size criteria. There is no hilar adenopathy. No esophageal wall thickening. No thyroid nodule. Lungs/Pleura: Subpleural reticulation within the dependent and basilar lungs. Mild bronchiolectasis in the right lower lobe. No acute or confluent airspace disease. No findings of pulmonary edema. No pleural fluid. Trachea and central bronchi are patent. There is no pulmonary mass or dominant nodule. Upper Abdomen: Chronic pneumobilia, unchanged from 03/14/2017 abdominal CT. No acute findings. Musculoskeletal: There are no acute or suspicious osseous abnormalities. Review of the MIP images confirms the above findings. IMPRESSION: 1. Diffuse calcified and noncalcified atheromatous plaque throughout the thoracic aorta without dissection or acute aortic findings. 2. Mild dilatation of the main pulmonary artery suggesting pulmonary arterial hypertension. 3. Subpleural reticulation in the dependent and basilar lungs. This may represent hypoventilatory change or can be seen in the setting of interstitial lung disease. This could be further assessed none high-resolution chest CT on an elective basis. 4. Coronary artery calcifications. Aortic Atherosclerosis (ICD10-I70.0). Electronically Signed   By: Keith Rake M.D.   On: 01/12/2020 17:56    Anti-infectives (From admission, onward)   None      Scheduled Meds: . [MAR Hold] amLODipine  5 mg Oral Daily  . [START ON 01/14/2020] aspirin  81 mg Oral Pre-Cath  . [MAR Hold] aspirin EC  81 mg Oral Daily  . [MAR Hold] pantoprazole  40 mg Oral Daily  . [MAR Hold] rosuvastatin  20 mg Oral Daily  . [MAR Hold] sodium chloride flush  3 mL Intravenous Q12H  . [MAR Hold] vitamin B-12  1,000 mcg Oral Daily   Continuous Infusions: . sodium chloride    . [START ON 01/14/2020] sodium chloride 3 mL/kg/hr (01/13/20  1138)   Followed by  . [START ON 01/14/2020] sodium chloride    . heparin       LOS: 0 days    Para Skeans, MD [facility] Pager [phone] If 7PM-7AM, please contact night-coverage [email or web address] Password [facility] 01/13/2020, 2:54 PM

## 2020-01-13 NOTE — Discharge Summary (Signed)
Physician Discharge Summary  [name]:[id number] DOB: 14-Dec-1945 DOA: 01/12/2020  PCP: Perrin Maltese, MD  Admit date: 01/12/2020 Discharge date: 01/14/2020   Discharge Diagnoses:  Principal Problem:   Chest pain Active Problems:   Essential hypertension   AKI (acute kidney injury) ([place])   Anemia Chest pain Patient presented with chest pain with radiation of bilateral neck and right upper extremity relieved with Toradol and Nitropaste. Initial troponin of 660 that downward trended to 599. Will continue to trend to see if it is a downward trend. No other clear explanation of her elevated troponin. She does have mildly elevated AKI but would not suspect that it would elevate her troponin this much. Has risk factors with HTN, current tobacco use, and significant atherosclerotic plaque seen on CTA. will start Heparin gtt for now for NSTEMI.  Obtain echo in the morning. Last echo in 12/2017 with EF of 55-60% with normal diastolic function  She also has noted leukocytosis but currently afebrile and no findings on CTA. If she develops fever, low threshold to treat for community acquired pneumonia. Check PCT.  Trial Maalox\ Pos cath and pt being transferred for CABG.  AKI creatinine of 1.21 from prior of 1.17 give continous IV fluids for 8 hrs and recheck avoid nephrotoxic agent  Anemia Hgb of 10.8 from prior of 11.5. No overt bleeding check iron, vitamin T55 and folic- she also endorse peripheral neuropathy  Hx of HTN Hold benazepril, Lasix due to AKI. Continue amlodipine   Discharge Condition:  Stable  Diet recommendation:  Cardiac  Filed Weights   01/12/20 1335 01/13/20 1124 01/13/20 1630  Weight: 58.1 kg 58.1 kg 57.4 kg    Discharge activity:   History of present illness:  Ashley Lynch a 74 y.o.femalewith medical history significant forhistory of lymphoma s/p tonsillectomy and chemotherapy in remission, hypertension, PVD, TIA who presents with  concerns of headache and chest pain.  Patient states that she developed a headache that she describes as soreness last night on the top and back of her head. This persisted when she awoke this morning when she was working on her computer when she also developed anterior pressure chest pain that radiated to either side of her neck and then to her right shoulder and right arm. States pain still persisted until she got to the ED where she received medication. She also noted increasing shortness of breath last night but denies any wheezing. She is a current half a pack to a pack smoker and has been doing it for 50 years. She denies any cough or fever but has been having runny nose that she thinks is due to allergies. She also notes heartburn today but has been compliant with taking her PPI daily. No trauma or heavy lifting.No new meds other thantaking Benadryl recently for her sleep and allergies.  ED Course:She had temperature of 56F,intermittently bradycardic at times and normotensive on room air. Lab work is significant for elevated troponin of 660 that later trended down to 599. EKG is similar to prior except more frequent PVCs. No significant ST or T wave changes. CBC that showed mild leukocytosis of 13.9, stable normocytic anemia with hemoglobin of 10.8. Creatinine elevated at 1.21 from a prior of 1.17 a few months ago.  CT angio chest did not show any dissection.  She was given 30 mg of Toradol and in the ED.  Hospital Course:  Pt is an elderly female with pmh of htn dyslipidemia , with current ongoing smoking presented  with chest pain and elevated troponin. Cardiology consulted asap and pt scheduled to cardiac cath found to be in need of CABG. Informed by Dr.khan that pt is waitng on bed from [facility] and [facility] surgeon at [facility] accepted patient.  Procedures: Left Heart Cath. Consultations:  Cardiology - Dr.Khan.  Discharge Exam: Vitals:   01/13/20 1757 01/13/20 1809  BP: (!)  110/51 (!) 110/51  Pulse: (!) 58 (!) 58  Resp: 16 16  Temp: 98.3 F (36.8 C) 98.3 F (36.8 C)  SpO2: 98% 98%   Physical Exam Vitals reviewed.  HENT:     Head: Normocephalic.     Right Ear: External ear normal.     Left Ear: External ear normal.     Nose: Nose normal.  Eyes:     Extraocular Movements: Extraocular movements intact.  Cardiovascular:     Rate and Rhythm: Normal rate and regular rhythm.     Heart sounds: Normal heart sounds.  Pulmonary:     Effort: Pulmonary effort is normal.     Breath sounds: Normal breath sounds.  Abdominal:     General: Bowel sounds are normal.     Palpations: Abdomen is soft.  Skin:    General: Skin is warm.  Neurological:     General: No focal deficit present.     Mental Status: She is alert and oriented to person, place, and time.     Cranial Nerves: No cranial nerve deficit.  Psychiatric:        Mood and Affect: Mood normal.    Discharge Instructions   Discharge Instructions    Diet - low sodium heart healthy   Complete by: As directed    Diet - low sodium heart healthy   Complete by: As directed    Increase activity slowly   Complete by: As directed    Increase activity slowly   Complete by: As directed      Allergies as of 01/13/2020   No Known Allergies     Medication List    STOP taking these medications   ondansetron 4 MG disintegrating tablet Commonly known as: Zofran ODT   polyethylene glycol 17 g packet Commonly known as: MIRALAX / GLYCOLAX     TAKE these medications   amLODipine 5 MG tablet Commonly known as: NORVASC Take 1 tablet each morning   aspirin 81 MG tablet Take 81 mg by mouth daily.   benazepril 20 MG tablet Commonly known as: LOTENSIN Take 20 mg by mouth daily.   fluticasone 50 MCG/ACT nasal spray Commonly known as: FLONASE instill 2 sprays into each nostril once daily   furosemide 20 MG tablet Commonly known as: LASIX Take 20 mg by mouth daily.   omeprazole 20 MG  capsule Commonly known as: PRILOSEC Take 1 capsule (20 mg total) by mouth daily.   rosuvastatin 10 MG tablet Commonly known as: CRESTOR Take 10 mg by mouth daily.   traMADol 50 MG tablet Commonly known as: ULTRAM Take 50 mg by mouth 3 (three) times daily as needed.   vitamin B-12 1000 MCG tablet Commonly known as: CYANOCOBALAMIN Take 1,000 mcg by mouth daily.      No Known Allergies    The results of significant diagnostics from this hospitalization (including imaging, microbiology, ancillary and laboratory) are listed below for reference.    Significant Diagnostic Studies: DG Chest 2 View  Result Date: 01/12/2020 CLINICAL DATA:  Acute onset chest, neck, back and arm pain today. EXAM: CHEST - 2 VIEW COMPARISON:  PA and lateral chest 10/21/2008. FINDINGS: Port-A-Cath present on the prior exam has been removed. Lungs are clear. Heart size is normal. No pneumothorax or pleural fluid. No acute or focal bony abnormality. IMPRESSION: No acute disease. Electronically Signed   By: Inge Rise M.D.   On: 01/12/2020 14:15   CT Angio Chest Aorta w/CM &/OR wo/CM  Result Date: 01/12/2020 CLINICAL DATA:  Chest pain or back pain, aortic dissection suspected EXAM: CT ANGIOGRAPHY CHEST WITH CONTRAST TECHNIQUE: Precontrast images were obtained. Multidetector CT imaging of the chest was performed using the standard protocol during bolus administration of intravenous contrast. Multiplanar CT image reconstructions and MIPs were obtained to evaluate the vascular anatomy. CONTRAST:  35mL OMNIPAQUE IOHEXOL 350 MG/ML SOLN COMPARISON:  Radiograph earlier today. FINDINGS: Cardiovascular: No aortic hematoma on noncontrast exam. There is no aortic dissection. Advanced calcified noncalcified atheromatous plaque throughout the thoracic aorta. There is no periaortic stranding. No evidence of penetrating ulcer or acute aortic findings. Descending aortic tortuosity. No aortic aneurysm. Conventional branching  pattern from the aortic arch. There is just over 50% luminal narrowing of the origin of the left common carotid artery due to atheromatous plaque. Branch vessels otherwise patent. Mild dilatation of the main pulmonary artery at 3.4 cm. There are no obvious pulmonary arterial lung filling defects, evaluation at tailored to pulmonary artery evaluation. Only the main pulmonary artery is well evaluated. There are coronary artery calcifications. Heart size upper normal. No pericardial effusion. Mediastinum/Nodes: Shotty mediastinal lymph nodes not enlarged by size criteria. There is no hilar adenopathy. No esophageal wall thickening. No thyroid nodule. Lungs/Pleura: Subpleural reticulation within the dependent and basilar lungs. Mild bronchiolectasis in the right lower lobe. No acute or confluent airspace disease. No findings of pulmonary edema. No pleural fluid. Trachea and central bronchi are patent. There is no pulmonary mass or dominant nodule. Upper Abdomen: Chronic pneumobilia, unchanged from 03/14/2017 abdominal CT. No acute findings. Musculoskeletal: There are no acute or suspicious osseous abnormalities. Review of the MIP images confirms the above findings. IMPRESSION: 1. Diffuse calcified and noncalcified atheromatous plaque throughout the thoracic aorta without dissection or acute aortic findings. 2. Mild dilatation of the main pulmonary artery suggesting pulmonary arterial hypertension. 3. Subpleural reticulation in the dependent and basilar lungs. This may represent hypoventilatory change or can be seen in the setting of interstitial lung disease. This could be further assessed none high-resolution chest CT on an elective basis. 4. Coronary artery calcifications. Aortic Atherosclerosis (ICD10-I70.0). Electronically Signed   By: Keith Rake M.D.   On: 01/12/2020 17:56   ECHOCARDIOGRAM COMPLETE  Result Date: 01/13/2020    ECHOCARDIOGRAM REPORT   Patient Name:   MELANIE OPENSHAW Date of Exam: 01/13/2020  Medical Rec #:  [id number]     Height:       63.0 in Accession #:    [id number]    Weight:       128.0 lb Date of Birth:  07/02/1945     BSA:          1.600 m Patient Age:    49 years      BP:           135/66 mmHg Patient Gender: F             HR:           71 bpm. Exam Location:  [facility] Procedure: 2D Echo, Cardiac Doppler and Color Doppler Indications:     Chest pain 786.50  History:  Patient has prior history of Echocardiogram examinations, most                  recent 06/29/2017. Risk Factors:Hypertension.  Sonographer:     Sherrie Sport RDCS (AE) Referring Phys:  [id number] Angela Burke TU Diagnosing Phys: Neoma Laming MD  Sonographer Comments: No apical window and no subcostal window. IMPRESSIONS  1. Left ventricular ejection fraction, by estimation, is 50 to 55%. The left ventricle has low normal function. The left ventricle demonstrates regional wall motion abnormalities (see scoring diagram/findings for description). There is moderate concentric left ventricular hypertrophy. Left ventricular diastolic parameters are consistent with Grade I diastolic dysfunction (impaired relaxation).  2. Right ventricular systolic function is normal. The right ventricular size is normal.  3. The mitral valve is normal in structure. No evidence of mitral valve regurgitation. No evidence of mitral stenosis.  4. The aortic valve is normal in structure. Aortic valve regurgitation is not visualized. No aortic stenosis is present.  5. The inferior vena cava is normal in size with greater than 50% respiratory variability, suggesting right atrial pressure of 3 mmHg. FINDINGS  Left Ventricle: Left ventricular ejection fraction, by estimation, is 50 to 55%. The left ventricle has low normal function. The left ventricle demonstrates regional wall motion abnormalities. The left ventricular internal cavity size was normal in size. There is moderate concentric left ventricular hypertrophy. Left ventricular diastolic parameters are consistent  with Grade I diastolic dysfunction (impaired relaxation).  LV Wall Scoring: The basal anteroseptal segment is hypokinetic. The basal anterior segment is normal. Right Ventricle: The right ventricular size is normal. No increase in right ventricular wall thickness. Right ventricular systolic function is normal. Left Atrium: Left atrial size was normal in size. Right Atrium: Right atrial size was normal in size. Pericardium: There is no evidence of pericardial effusion. Mitral Valve: The mitral valve is normal in structure. No evidence of mitral valve regurgitation. No evidence of mitral valve stenosis. Tricuspid Valve: The tricuspid valve is normal in structure. Tricuspid valve regurgitation is not demonstrated. No evidence of tricuspid stenosis. Aortic Valve: The aortic valve is normal in structure. Aortic valve regurgitation is not visualized. No aortic stenosis is present. Pulmonic Valve: The pulmonic valve was normal in structure. Pulmonic valve regurgitation is not visualized. No evidence of pulmonic stenosis. Aorta: The aortic root is normal in size and structure. Venous: The inferior vena cava is normal in size with greater than 50% respiratory variability, suggesting right atrial pressure of 3 mmHg. IAS/Shunts: No atrial level shunt detected by color flow Doppler.  LEFT VENTRICLE PLAX 2D LVIDd:         4.38 cm LVIDs:         3.07 cm LV PW:         1.44 cm LV IVS:        1.20 cm LVOT diam:     2.00 cm LVOT Area:     3.14 cm  LEFT ATRIUM         Index LA diam:    3.40 cm 2.13 cm/m                        PULMONIC VALVE AORTA                 PV Vmax:        0.72 m/s Ao Root diam: 2.40 cm PV Peak grad:   2.1 mmHg  RVOT Peak grad: 3 mmHg   SHUNTS Systemic Diam: 2.00 cm Neoma Laming MD Electronically signed by Neoma Laming MD Signature Date/Time: 01/13/2020/1:10:16 PM    Final     Microbiology: Recent Results (from the past 240 hour(s))  Respiratory Panel by RT PCR (Flu A&B, Covid) -  Nasopharyngeal Swab     Status: None   Collection Time: 01/12/20  7:16 PM   Specimen: Nasopharyngeal Swab  Result Value Ref Range Status   SARS Coronavirus 2 by RT PCR NEGATIVE NEGATIVE Final    Comment: (NOTE) SARS-CoV-2 target nucleic acids are NOT DETECTED.  The SARS-CoV-2 RNA is generally detectable in upper respiratoy specimens during the acute phase of infection. The lowest concentration of SARS-CoV-2 viral copies this assay can detect is 131 copies/mL. A negative result does not preclude SARS-Cov-2 infection and should not be used as the sole basis for treatment or other patient management decisions. A negative result may occur with  improper specimen collection/handling, submission of specimen other than nasopharyngeal swab, presence of viral mutation(s) within the areas targeted by this assay, and inadequate number of viral copies (<131 copies/mL). A negative result must be combined with clinical observations, patient history, and epidemiological information. The expected result is Negative.  Fact Sheet for Patients:  PinkCheek.be  Fact Sheet for Healthcare Providers:  GravelBags.it  This test is no t yet approved or cleared by the [place] FDA and  has been authorized for detection and/or diagnosis of SARS-CoV-2 by FDA under an Emergency Use Authorization (EUA). This EUA will remain  in effect (meaning this test can be used) for the duration of the COVID-19 declaration under Section 564(b)(1) of the Act, 21 U.S.C. section 360bbb-3(b)(1), unless the authorization is terminated or revoked sooner.     Influenza A by PCR NEGATIVE NEGATIVE Final   Influenza B by PCR NEGATIVE NEGATIVE Final    Comment: (NOTE) The Xpert Xpress SARS-CoV-2/FLU/RSV assay is intended as an aid in  the diagnosis of influenza from Nasopharyngeal swab specimens and  should not be used as a sole basis for treatment. Nasal washings and   aspirates are unacceptable for Xpert Xpress SARS-CoV-2/FLU/RSV  testing.  Fact Sheet for Patients: PinkCheek.be  Fact Sheet for Healthcare Providers: GravelBags.it  This test is not yet approved or cleared by the [place] FDA and  has been authorized for detection and/or diagnosis of SARS-CoV-2 by  FDA under an Emergency Use Authorization (EUA). This EUA will remain  in effect (meaning this test can be used) for the duration of the  Covid-19 declaration under Section 564(b)(1) of the Act, 21  U.S.C. section 360bbb-3(b)(1), unless the authorization is  terminated or revoked. Performed at [facility], [address] [place], [place] [address]      Labs: Basic Metabolic Panel: Recent Labs  Lab 01/12/20 1348  NA 136  K 3.8  CL 101  CO2 24  GLUCOSE 121*  BUN 22  CREATININE 1.21*  CALCIUM 9.4   Liver Function Tests: No results for input(s): AST, ALT, ALKPHOS, BILITOT, PROT, ALBUMIN in the last 168 hours. No results for input(s): LIPASE, AMYLASE in the last 168 hours. No results for input(s): AMMONIA in the last 168 hours. CBC: Recent Labs  Lab 01/12/20 1348 01/13/20 0531  WBC 13.9* 11.5*  HGB 10.8* 9.3*  HCT 30.8* 25.5*  MCV 89.3 90.4  PLT 260 241   Cardiac Enzymes: No results for input(s): CKTOTAL, CKMB, CKMBINDEX, TROPONINI in the last 168 hours. BNP: BNP (last 3 results)  No results for input(s): BNP in the last 8760 hours.  ProBNP (last 3 results) No results for input(s): PROBNP in the last 8760 hours.  CBG: No results for input(s): GLUCAP in the last 168 hours.  Time spent: 20 minutes  Signed:  Para Skeans MD.  [facility] 01/14/2020, 8:00 AM

## 2020-01-13 NOTE — ED Notes (Signed)
Pt had bowel movement at this time. Pt cleaned up by this RN, sheets changed and patient given clean dry blankets. Will continue to monitor.

## 2020-01-13 NOTE — Progress Notes (Signed)
*  PRELIMINARY RESULTS* Echocardiogram 2D Echocardiogram has been performed.  Sherrie Sport 01/13/2020, 9:20 AM

## 2020-01-13 NOTE — ED Notes (Signed)
Echo tech at bedside.

## 2020-01-13 NOTE — Progress Notes (Signed)
Patient has remained clinically stable post Heart cath with Dr Humphrey Rolls, vitals stable. Sinus rhythm per monitor. Denies complaint at this time. grandaughter present at this time with patient with update given. Report called to Levonne Spiller on telemetry with plan reviewed,patient awaiting transfer to [facility] when bed available. Right radial band deflated to 0, with no visible bleeding nor hematoma at site. Right groin site also without bleeding nor hematoma, to restart heparin gtt after 4hours no bolus per orders.

## 2020-01-14 ENCOUNTER — Encounter: Payer: Self-pay | Admitting: Cardiovascular Disease

## 2021-03-03 DIAGNOSIS — I2510 Atherosclerotic heart disease of native coronary artery without angina pectoris: Secondary | ICD-10-CM | POA: Diagnosis not present

## 2021-03-03 DIAGNOSIS — I10 Essential (primary) hypertension: Secondary | ICD-10-CM | POA: Diagnosis not present

## 2021-03-03 DIAGNOSIS — R350 Frequency of micturition: Secondary | ICD-10-CM | POA: Diagnosis not present

## 2021-03-03 DIAGNOSIS — E782 Mixed hyperlipidemia: Secondary | ICD-10-CM | POA: Diagnosis not present

## 2021-03-03 DIAGNOSIS — I739 Peripheral vascular disease, unspecified: Secondary | ICD-10-CM | POA: Diagnosis not present

## 2021-03-03 DIAGNOSIS — E538 Deficiency of other specified B group vitamins: Secondary | ICD-10-CM | POA: Diagnosis not present

## 2021-04-21 DIAGNOSIS — N189 Chronic kidney disease, unspecified: Secondary | ICD-10-CM | POA: Diagnosis not present

## 2021-04-21 DIAGNOSIS — R609 Edema, unspecified: Secondary | ICD-10-CM | POA: Diagnosis not present

## 2021-04-21 DIAGNOSIS — N179 Acute kidney failure, unspecified: Secondary | ICD-10-CM | POA: Diagnosis not present

## 2021-04-21 DIAGNOSIS — I2510 Atherosclerotic heart disease of native coronary artery without angina pectoris: Secondary | ICD-10-CM | POA: Diagnosis not present

## 2021-04-21 DIAGNOSIS — R7989 Other specified abnormal findings of blood chemistry: Secondary | ICD-10-CM | POA: Diagnosis not present

## 2021-04-21 DIAGNOSIS — I110 Hypertensive heart disease with heart failure: Secondary | ICD-10-CM | POA: Diagnosis not present

## 2021-04-21 DIAGNOSIS — I5021 Acute systolic (congestive) heart failure: Secondary | ICD-10-CM | POA: Diagnosis not present

## 2021-04-21 DIAGNOSIS — J90 Pleural effusion, not elsewhere classified: Secondary | ICD-10-CM | POA: Diagnosis not present

## 2021-04-21 DIAGNOSIS — R0602 Shortness of breath: Secondary | ICD-10-CM | POA: Diagnosis not present

## 2021-04-21 DIAGNOSIS — I21A1 Myocardial infarction type 2: Secondary | ICD-10-CM | POA: Diagnosis not present

## 2021-04-21 DIAGNOSIS — I509 Heart failure, unspecified: Secondary | ICD-10-CM | POA: Diagnosis not present

## 2021-04-21 DIAGNOSIS — E785 Hyperlipidemia, unspecified: Secondary | ICD-10-CM | POA: Diagnosis not present

## 2021-04-21 DIAGNOSIS — R079 Chest pain, unspecified: Secondary | ICD-10-CM | POA: Diagnosis not present

## 2021-04-21 DIAGNOSIS — Z20822 Contact with and (suspected) exposure to covid-19: Secondary | ICD-10-CM | POA: Diagnosis not present

## 2021-04-21 DIAGNOSIS — I77810 Thoracic aortic ectasia: Secondary | ICD-10-CM | POA: Diagnosis not present

## 2021-04-21 DIAGNOSIS — R791 Abnormal coagulation profile: Secondary | ICD-10-CM | POA: Diagnosis not present

## 2021-04-21 DIAGNOSIS — J811 Chronic pulmonary edema: Secondary | ICD-10-CM | POA: Diagnosis not present

## 2021-04-21 DIAGNOSIS — R778 Other specified abnormalities of plasma proteins: Secondary | ICD-10-CM | POA: Diagnosis not present

## 2021-04-21 DIAGNOSIS — C8330 Diffuse large B-cell lymphoma, unspecified site: Secondary | ICD-10-CM | POA: Diagnosis not present

## 2021-04-21 DIAGNOSIS — R0689 Other abnormalities of breathing: Secondary | ICD-10-CM | POA: Diagnosis not present

## 2021-04-21 DIAGNOSIS — I10 Essential (primary) hypertension: Secondary | ICD-10-CM | POA: Diagnosis not present

## 2021-04-21 DIAGNOSIS — I130 Hypertensive heart and chronic kidney disease with heart failure and stage 1 through stage 4 chronic kidney disease, or unspecified chronic kidney disease: Secondary | ICD-10-CM | POA: Diagnosis not present

## 2021-04-21 DIAGNOSIS — I252 Old myocardial infarction: Secondary | ICD-10-CM | POA: Diagnosis not present

## 2021-04-21 DIAGNOSIS — I214 Non-ST elevation (NSTEMI) myocardial infarction: Secondary | ICD-10-CM | POA: Diagnosis not present

## 2021-04-22 DIAGNOSIS — N189 Chronic kidney disease, unspecified: Secondary | ICD-10-CM | POA: Diagnosis not present

## 2021-04-22 DIAGNOSIS — I509 Heart failure, unspecified: Secondary | ICD-10-CM | POA: Diagnosis not present

## 2021-04-22 DIAGNOSIS — I130 Hypertensive heart and chronic kidney disease with heart failure and stage 1 through stage 4 chronic kidney disease, or unspecified chronic kidney disease: Secondary | ICD-10-CM | POA: Diagnosis not present

## 2021-04-22 DIAGNOSIS — I5021 Acute systolic (congestive) heart failure: Secondary | ICD-10-CM | POA: Diagnosis not present

## 2021-04-22 DIAGNOSIS — I214 Non-ST elevation (NSTEMI) myocardial infarction: Secondary | ICD-10-CM | POA: Diagnosis not present

## 2021-04-23 DIAGNOSIS — I110 Hypertensive heart disease with heart failure: Secondary | ICD-10-CM | POA: Diagnosis not present

## 2021-04-23 DIAGNOSIS — I214 Non-ST elevation (NSTEMI) myocardial infarction: Secondary | ICD-10-CM | POA: Diagnosis not present

## 2021-04-23 DIAGNOSIS — E785 Hyperlipidemia, unspecified: Secondary | ICD-10-CM | POA: Diagnosis not present

## 2021-04-23 DIAGNOSIS — R778 Other specified abnormalities of plasma proteins: Secondary | ICD-10-CM | POA: Diagnosis not present

## 2021-04-23 DIAGNOSIS — Z7982 Long term (current) use of aspirin: Secondary | ICD-10-CM | POA: Diagnosis not present

## 2021-04-23 DIAGNOSIS — E876 Hypokalemia: Secondary | ICD-10-CM | POA: Diagnosis not present

## 2021-04-23 DIAGNOSIS — Z7902 Long term (current) use of antithrombotics/antiplatelets: Secondary | ICD-10-CM | POA: Diagnosis not present

## 2021-04-23 DIAGNOSIS — R339 Retention of urine, unspecified: Secondary | ICD-10-CM | POA: Diagnosis not present

## 2021-04-23 DIAGNOSIS — I10 Essential (primary) hypertension: Secondary | ICD-10-CM | POA: Diagnosis not present

## 2021-04-23 DIAGNOSIS — E7800 Pure hypercholesterolemia, unspecified: Secondary | ICD-10-CM | POA: Diagnosis not present

## 2021-04-23 DIAGNOSIS — Z8679 Personal history of other diseases of the circulatory system: Secondary | ICD-10-CM | POA: Diagnosis not present

## 2021-04-23 DIAGNOSIS — K219 Gastro-esophageal reflux disease without esophagitis: Secondary | ICD-10-CM | POA: Diagnosis not present

## 2021-04-23 DIAGNOSIS — Z79899 Other long term (current) drug therapy: Secondary | ICD-10-CM | POA: Diagnosis not present

## 2021-04-23 DIAGNOSIS — Z955 Presence of coronary angioplasty implant and graft: Secondary | ICD-10-CM | POA: Diagnosis not present

## 2021-04-23 DIAGNOSIS — I739 Peripheral vascular disease, unspecified: Secondary | ICD-10-CM | POA: Diagnosis not present

## 2021-04-23 DIAGNOSIS — R32 Unspecified urinary incontinence: Secondary | ICD-10-CM | POA: Diagnosis not present

## 2021-04-23 DIAGNOSIS — Z8572 Personal history of non-Hodgkin lymphomas: Secondary | ICD-10-CM | POA: Diagnosis not present

## 2021-04-23 DIAGNOSIS — I2510 Atherosclerotic heart disease of native coronary artery without angina pectoris: Secondary | ICD-10-CM | POA: Diagnosis not present

## 2021-04-23 DIAGNOSIS — I5020 Unspecified systolic (congestive) heart failure: Secondary | ICD-10-CM | POA: Insufficient documentation

## 2021-04-23 DIAGNOSIS — N179 Acute kidney failure, unspecified: Secondary | ICD-10-CM | POA: Diagnosis not present

## 2021-04-23 DIAGNOSIS — F17210 Nicotine dependence, cigarettes, uncomplicated: Secondary | ICD-10-CM | POA: Diagnosis not present

## 2021-04-23 DIAGNOSIS — I219 Acute myocardial infarction, unspecified: Secondary | ICD-10-CM

## 2021-04-23 DIAGNOSIS — F32A Depression, unspecified: Secondary | ICD-10-CM | POA: Diagnosis not present

## 2021-04-23 DIAGNOSIS — R0602 Shortness of breath: Secondary | ICD-10-CM | POA: Diagnosis not present

## 2021-04-23 DIAGNOSIS — Z9889 Other specified postprocedural states: Secondary | ICD-10-CM | POA: Diagnosis not present

## 2021-04-23 DIAGNOSIS — I5021 Acute systolic (congestive) heart failure: Secondary | ICD-10-CM | POA: Diagnosis not present

## 2021-04-23 DIAGNOSIS — I21A1 Myocardial infarction type 2: Secondary | ICD-10-CM | POA: Diagnosis not present

## 2021-04-23 HISTORY — DX: Acute myocardial infarction, unspecified: I21.9

## 2021-04-25 HISTORY — PX: CARDIAC CATHETERIZATION: SHX172

## 2021-07-25 DIAGNOSIS — H524 Presbyopia: Secondary | ICD-10-CM | POA: Diagnosis not present

## 2021-07-25 DIAGNOSIS — H35372 Puckering of macula, left eye: Secondary | ICD-10-CM | POA: Diagnosis not present

## 2021-08-02 DIAGNOSIS — H2512 Age-related nuclear cataract, left eye: Secondary | ICD-10-CM | POA: Diagnosis not present

## 2021-08-16 ENCOUNTER — Encounter: Payer: Self-pay | Admitting: Ophthalmology

## 2021-08-19 DIAGNOSIS — E782 Mixed hyperlipidemia: Secondary | ICD-10-CM | POA: Diagnosis not present

## 2021-08-19 DIAGNOSIS — F17200 Nicotine dependence, unspecified, uncomplicated: Secondary | ICD-10-CM | POA: Diagnosis not present

## 2021-08-19 DIAGNOSIS — I2510 Atherosclerotic heart disease of native coronary artery without angina pectoris: Secondary | ICD-10-CM | POA: Diagnosis not present

## 2021-08-19 DIAGNOSIS — I739 Peripheral vascular disease, unspecified: Secondary | ICD-10-CM | POA: Diagnosis not present

## 2021-08-19 DIAGNOSIS — I10 Essential (primary) hypertension: Secondary | ICD-10-CM | POA: Diagnosis not present

## 2021-08-23 ENCOUNTER — Ambulatory Visit: Admit: 2021-08-23 | Payer: Medicare PPO | Admitting: Ophthalmology

## 2021-08-23 HISTORY — DX: Cardiomyopathy, unspecified: I42.9

## 2021-08-23 HISTORY — DX: Unspecified systolic (congestive) heart failure: I50.20

## 2021-08-23 HISTORY — DX: Other complications of anesthesia, initial encounter: T88.59XA

## 2021-08-23 HISTORY — DX: Embolism and thrombosis of iliac artery: I74.5

## 2021-08-23 HISTORY — DX: Peripheral vascular disease, unspecified: I73.9

## 2021-08-23 SURGERY — PHACOEMULSIFICATION, CATARACT, WITH IOL INSERTION
Anesthesia: Topical | Laterality: Left

## 2021-09-06 ENCOUNTER — Ambulatory Visit: Admit: 2021-09-06 | Payer: Medicare PPO | Admitting: Ophthalmology

## 2021-09-06 SURGERY — PHACOEMULSIFICATION, CATARACT, WITH IOL INSERTION
Anesthesia: Topical | Laterality: Right

## 2021-09-15 NOTE — Discharge Instructions (Signed)

## 2021-09-16 DIAGNOSIS — I714 Abdominal aortic aneurysm, without rupture, unspecified: Secondary | ICD-10-CM | POA: Diagnosis not present

## 2021-09-16 DIAGNOSIS — I739 Peripheral vascular disease, unspecified: Secondary | ICD-10-CM | POA: Diagnosis not present

## 2021-09-16 DIAGNOSIS — I2510 Atherosclerotic heart disease of native coronary artery without angina pectoris: Secondary | ICD-10-CM | POA: Diagnosis not present

## 2021-09-16 DIAGNOSIS — E782 Mixed hyperlipidemia: Secondary | ICD-10-CM | POA: Diagnosis not present

## 2021-09-16 DIAGNOSIS — I10 Essential (primary) hypertension: Secondary | ICD-10-CM | POA: Diagnosis not present

## 2021-09-16 DIAGNOSIS — Z9861 Coronary angioplasty status: Secondary | ICD-10-CM | POA: Diagnosis not present

## 2021-09-16 DIAGNOSIS — F17200 Nicotine dependence, unspecified, uncomplicated: Secondary | ICD-10-CM | POA: Diagnosis not present

## 2021-09-19 ENCOUNTER — Encounter: Payer: Self-pay | Admitting: Ophthalmology

## 2021-09-19 DIAGNOSIS — K219 Gastro-esophageal reflux disease without esophagitis: Secondary | ICD-10-CM | POA: Diagnosis not present

## 2021-09-19 DIAGNOSIS — N3946 Mixed incontinence: Secondary | ICD-10-CM | POA: Diagnosis not present

## 2021-09-19 DIAGNOSIS — E782 Mixed hyperlipidemia: Secondary | ICD-10-CM | POA: Diagnosis not present

## 2021-09-19 DIAGNOSIS — I2510 Atherosclerotic heart disease of native coronary artery without angina pectoris: Secondary | ICD-10-CM | POA: Diagnosis not present

## 2021-09-19 DIAGNOSIS — I10 Essential (primary) hypertension: Secondary | ICD-10-CM | POA: Diagnosis not present

## 2021-09-20 ENCOUNTER — Other Ambulatory Visit: Payer: Self-pay

## 2021-09-20 ENCOUNTER — Encounter
Admission: RE | Disposition: A | Discharge status: Home / Self Care | Payer: Self-pay | Source: Home / Self Care | Attending: Ophthalmology

## 2021-09-20 ENCOUNTER — Encounter: Payer: Self-pay | Admitting: Ophthalmology

## 2021-09-20 ENCOUNTER — Ambulatory Visit: Payer: Medicare PPO | Admitting: Anesthesiology

## 2021-09-20 ENCOUNTER — Ambulatory Visit
Admission: RE | Admit: 2021-09-20 | Discharge: 2021-09-20 | Disposition: A | Discharge status: Home / Self Care | Payer: Medicare PPO | Attending: Ophthalmology | Admitting: Ophthalmology

## 2021-09-20 DIAGNOSIS — I2510 Atherosclerotic heart disease of native coronary artery without angina pectoris: Secondary | ICD-10-CM | POA: Insufficient documentation

## 2021-09-20 DIAGNOSIS — Z8673 Personal history of transient ischemic attack (TIA), and cerebral infarction without residual deficits: Secondary | ICD-10-CM | POA: Insufficient documentation

## 2021-09-20 DIAGNOSIS — I252 Old myocardial infarction: Secondary | ICD-10-CM | POA: Diagnosis not present

## 2021-09-20 DIAGNOSIS — I110 Hypertensive heart disease with heart failure: Secondary | ICD-10-CM | POA: Diagnosis not present

## 2021-09-20 DIAGNOSIS — H25812 Combined forms of age-related cataract, left eye: Secondary | ICD-10-CM | POA: Diagnosis not present

## 2021-09-20 DIAGNOSIS — I5020 Unspecified systolic (congestive) heart failure: Secondary | ICD-10-CM | POA: Insufficient documentation

## 2021-09-20 DIAGNOSIS — I739 Peripheral vascular disease, unspecified: Secondary | ICD-10-CM | POA: Diagnosis not present

## 2021-09-20 DIAGNOSIS — H2512 Age-related nuclear cataract, left eye: Secondary | ICD-10-CM | POA: Diagnosis not present

## 2021-09-20 DIAGNOSIS — F17210 Nicotine dependence, cigarettes, uncomplicated: Secondary | ICD-10-CM | POA: Insufficient documentation

## 2021-09-20 HISTORY — PX: CATARACT EXTRACTION W/PHACO: SHX586

## 2021-09-20 SURGERY — PHACOEMULSIFICATION, CATARACT, WITH IOL INSERTION
Anesthesia: Topical | Laterality: Left

## 2021-09-20 SURGERY — PHACOEMULSIFICATION, CATARACT, WITH IOL INSERTION
Anesthesia: Monitor Anesthesia Care | Site: Eye | Laterality: Left

## 2021-09-20 MED ORDER — MOXIFLOXACIN HCL 0.5 % OP SOLN
OPHTHALMIC | Status: DC | PRN
  Administered 2021-09-20: 0.2 mL via OPHTHALMIC

## 2021-09-20 MED ORDER — ARMC OPHTHALMIC DILATING DROPS
1.0000 "application " | OPHTHALMIC | Status: DC | PRN
  Administered 2021-09-20 (×3): 1 via OPHTHALMIC

## 2021-09-20 MED ORDER — TETRACAINE HCL 0.5 % OP SOLN
1.0000 [drp] | OPHTHALMIC | Status: DC | PRN
  Administered 2021-09-20 (×3): 1 [drp] via OPHTHALMIC

## 2021-09-20 MED ORDER — BRIMONIDINE TARTRATE-TIMOLOL 0.2-0.5 % OP SOLN
OPHTHALMIC | Status: DC | PRN
  Administered 2021-09-20: 1 [drp] via OPHTHALMIC

## 2021-09-20 MED ORDER — SIGHTPATH DOSE#1 NA CHONDROIT SULF-NA HYALURON 40-17 MG/ML IO SOLN
INTRAOCULAR | Status: DC | PRN
  Administered 2021-09-20: 1 mL via INTRAOCULAR

## 2021-09-20 MED ORDER — SIGHTPATH DOSE#1 BSS IO SOLN
INTRAOCULAR | Status: DC | PRN
  Administered 2021-09-20: 67 mL via OPHTHALMIC

## 2021-09-20 MED ORDER — SIGHTPATH DOSE#1 BSS IO SOLN
INTRAOCULAR | Status: DC | PRN
  Administered 2021-09-20: 15 mL via INTRAOCULAR

## 2021-09-20 MED ORDER — FENTANYL CITRATE (PF) 100 MCG/2ML IJ SOLN
INTRAMUSCULAR | Status: DC | PRN
  Administered 2021-09-20: 50 ug via INTRAVENOUS

## 2021-09-20 MED ORDER — SIGHTPATH DOSE#1 BSS IO SOLN
INTRAOCULAR | Status: DC | PRN
  Administered 2021-09-20: 2 mL

## 2021-09-20 MED ORDER — MIDAZOLAM HCL 2 MG/2ML IJ SOLN
INTRAMUSCULAR | Status: DC | PRN
  Administered 2021-09-20: 1 mg via INTRAVENOUS

## 2021-09-20 SURGICAL SUPPLY — 12 items
CANNULA ANT/CHMB 27G (MISCELLANEOUS) IMPLANT
CANNULA ANT/CHMB 27GA (MISCELLANEOUS) IMPLANT
CATARACT SUITE SIGHTPATH (MISCELLANEOUS) ×2 IMPLANT
FEE CATARACT SUITE SIGHTPATH (MISCELLANEOUS) ×1 IMPLANT
GLOVE SURG ENC TEXT LTX SZ8 (GLOVE) ×2 IMPLANT
GLOVE SURG TRIUMPH 8.0 PF LTX (GLOVE) ×2 IMPLANT
LENS IOL TECNIS EYHANCE 22.0 (Intraocular Lens) ×1 IMPLANT
NDL FILTER BLUNT 18X1 1/2 (NEEDLE) ×1 IMPLANT
NEEDLE FILTER BLUNT 18X 1/2SAF (NEEDLE) ×1
NEEDLE FILTER BLUNT 18X1 1/2 (NEEDLE) ×1 IMPLANT
SYR 3ML LL SCALE MARK (SYRINGE) ×2 IMPLANT
WATER STERILE IRR 250ML POUR (IV SOLUTION) ×2 IMPLANT

## 2021-09-20 NOTE — Op Note (Signed)
PREOPERATIVE DIAGNOSIS:  Nuclear sclerotic cataract of the left eye.   POSTOPERATIVE DIAGNOSIS:  Nuclear sclerotic cataract of the left eye.   OPERATIVE PROCEDURE:ORPROCALL@   SURGEON:  Birder Robson, MD.   ANESTHESIA:  Anesthesiologist: Elgie Collard, MD CRNA: Silvana Newness, CRNA  1.      Managed anesthesia care. 2.     0.65m of Shugarcaine was instilled following the paracentesis   COMPLICATIONS:  None.   TECHNIQUE:   Stop and chop   DESCRIPTION OF PROCEDURE:  The patient was examined and consented in the preoperative holding area where the aforementioned topical anesthesia was applied to the left eye and then brought back to the Operating Room where the left eye was prepped and draped in the usual sterile ophthalmic fashion and a lid speculum was placed. A paracentesis was created with the side port blade and the anterior chamber was filled with viscoelastic. A near clear corneal incision was performed with the steel keratome. A continuous curvilinear capsulorrhexis was performed with a cystotome followed by the capsulorrhexis forceps. Hydrodissection and hydrodelineation were carried out with BSS on a blunt cannula. The lens was removed in a stop and chop  technique and the remaining cortical material was removed with the irrigation-aspiration handpiece. The capsular bag was inflated with viscoelastic and the Technis [id number] lens was placed in the capsular bag without complication. The remaining viscoelastic was removed from the eye with the irrigation-aspiration handpiece. The wounds were hydrated. The anterior chamber was flushed with BSS and the eye was inflated to physiologic pressure. 0.146mVigamox was placed in the anterior chamber. The wounds were found to be water tight. The eye was dressed with Combigan. The patient was given protective glasses to wear throughout the day and a shield with which to sleep tonight. The patient was also given drops with which to begin a drop regimen  today and will follow-up with me in one day. Implant Name Type Inv. Item Serial No. Manufacturer Lot No. LRB No. Used Action  LENS IOL TECNIS EYHANCE 22.0 - [id number] Lens LENS IOL TECNIS EYHANCE 22.0 [id number]  Left 1 Implanted    Procedure(s): CATARACT EXTRACTION PHACO AND INTRAOCULAR LENS PLACEMENT (IOC) LEFT  18.07 01:25.3 (Left)  Electronically signed: WiBirder Robson/09/2021 1:06 PM

## 2021-09-20 NOTE — Anesthesia Preprocedure Evaluation (Addendum)
Anesthesia Evaluation  Patient identified by MRN, date of birth, ID band Patient awake    Reviewed: Allergy & Precautions, H&P , NPO status , Patient's Chart, lab work & pertinent test results  Airway Mallampati: II  TM Distance: >3 FB Neck ROM: full    Dental no notable dental hx.    Pulmonary Current Smoker and Patient abstained from smoking.,    Pulmonary exam normal        Cardiovascular hypertension, On Medications + CAD, + Past MI, + Peripheral Vascular Disease and +CHF  Normal cardiovascular exam Rhythm:regular Rate:Normal     Neuro/Psych TIA   GI/Hepatic negative GI ROS, Neg liver ROS,   Endo/Other  negative endocrine ROS  Renal/GU      Musculoskeletal   Abdominal   Peds  Hematology  (+) Blood dyscrasia, anemia ,   Anesthesia Other Findings   Reproductive/Obstetrics                            Anesthesia Physical Anesthesia Plan  ASA: 3  Anesthesia Plan: MAC   Post-op Pain Management:    Induction:   PONV Risk Score and Plan: 1 and TIVA, Midazolam and Treatment may vary due to age or medical condition  Airway Management Planned:   Additional Equipment:   Intra-op Plan:   Post-operative Plan:   Informed Consent: I have reviewed the patients History and Physical, chart, labs and discussed the procedure including the risks, benefits and alternatives for the proposed anesthesia with the patient or authorized representative who has indicated his/her understanding and acceptance.       Plan Discussed with:   Anesthesia Plan Comments:         Anesthesia Quick Evaluation

## 2021-09-20 NOTE — Transfer of Care (Signed)
Immediate Anesthesia Transfer of Care Note  Patient: Ashley Lynch  Procedure(s) Performed: CATARACT EXTRACTION PHACO AND INTRAOCULAR LENS PLACEMENT (IOC) LEFT  18.07 01:25.3 (Left: Eye)  Patient Location: PACU  Anesthesia Type: MAC  Level of Consciousness: awake, alert  and patient cooperative  Airway and Oxygen Therapy: Patient Spontanous Breathing and Patient connected to supplemental oxygen  Post-op Assessment: Post-op Vital signs reviewed, Patient's Cardiovascular Status Stable, Respiratory Function Stable, Patent Airway and No signs of Nausea or vomiting  Post-op Vital Signs: Reviewed and stable  Complications: No notable events documented.

## 2021-09-20 NOTE — Anesthesia Postprocedure Evaluation (Signed)
Anesthesia Post Note  Patient: Ashley Lynch  Procedure(s) Performed: CATARACT EXTRACTION PHACO AND INTRAOCULAR LENS PLACEMENT (IOC) LEFT  18.07 01:25.3 (Left: Eye)     Patient location during evaluation: PACU Anesthesia Type: MAC Level of consciousness: awake and alert Pain management: pain level controlled Vital Signs Assessment: post-procedure vital signs reviewed and stable Respiratory status: spontaneous breathing Cardiovascular status: stable Anesthetic complications: no   No notable events documented.  Gillian Scarce

## 2021-09-20 NOTE — H&P (Signed)
[place]   Primary Care Physician:  Perrin Maltese, MD Ophthalmologist: Dr. Hortense Ramal  Pre-Procedure History & Physical: HPI:  Ashley Lynch is a 76 y.o. female here for cataract surgery.   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 Authorizing Provider  benazepril (LOTENSIN) 20 MG tablet Take 20 mg by mouth daily. 01/08/20  Yes [provider]  clopidogrel (PLAVIX) 75 MG tablet Take 75 mg by mouth daily.   Yes [provider]  MAGNESIUM PO Take by mouth.   Yes [provider]  sertraline (ZOLOFT) 25 MG tablet Take 25 mg by mouth at bedtime.   Yes [provider]  amLODipine (NORVASC) 5 MG tablet Take 1 tablet each morning Patient not taking: Reported on 08/16/2021 06/19/16   Arlis Porta., MD  aspirin 81 MG tablet Take 81 mg by mouth daily. Patient not taking: Reported on 09/19/2021    [provider]  atorvastatin (LIPITOR) 80 MG tablet Take 80 mg by mouth. Patient not taking: Reported on  09/19/2021    [provider]  fluticasone Asencion Islam) 50 MCG/ACT nasal spray instill 2 sprays into each nostril once daily Patient not taking: Reported on 09/19/2021 01/11/16   Arlis Porta., MD  furosemide (LASIX) 20 MG tablet Take 20 mg by mouth daily. Patient not taking: Reported on 09/19/2021 12/11/19   [provider]  metoprolol succinate (TOPROL-XL) 25 MG 24 hr tablet Take 25 mg by mouth daily. Patient not taking: Reported on 08/16/2021    [provider]  omeprazole (PRILOSEC) 20 MG capsule Take 1 capsule (20 mg total) by mouth daily. Patient not taking: Reported on 08/16/2021 11/27/16   Olin Hauser, DO  rosuvastatin (CRESTOR) 10 MG tablet Take 10 mg by mouth daily. Patient not taking: Reported on 08/16/2021 09/02/19   [provider]  sacubitril-valsartan (ENTRESTO) 24-26 MG Take 1 tablet by mouth 2 (two) times daily. Patient not taking: Reported on 08/16/2021    [provider]  ticagrelor (BRILINTA) 90 MG TABS tablet Take 90 mg by mouth. Patient not taking: Reported on 08/16/2021    [provider]  traMADol (ULTRAM) 50 MG tablet Take 50 mg by mouth 3 (three) times daily as needed. Patient not taking: Reported on 08/16/2021 12/25/19   [provider]  vitamin B-12 (CYANOCOBALAMIN) 1000 MCG tablet Take 1,000 mcg by mouth daily. Patient not taking: Reported on  08/16/2021    [provider]    Allergies as of 08/25/2021   (No Known Allergies)    Family History  Problem Relation Age of Onset   Diabetes Mother        sibling and grandparent also    Social History   Socioeconomic History   Marital status: Widowed    Spouse name: Not on file   Number of children: Not on file   Years of education: Not on file   Highest education level: Not on file  Occupational History   Not on file  Tobacco Use   Smoking status: Every Day    Packs/day: 1.00    Years: 60.00    Pack years: 60.00    Types: Cigarettes    Smokeless tobacco: Never  Vaping Use   Vaping Use: Never used  Substance and Sexual Activity   Alcohol use: No   Drug use: No   Sexual activity: Not on file  Other Topics Concern   Not on file  Social History Narrative   Not on file   Social Determinants of Health   Financial Resource Strain: Not on file  Food Insecurity: Not on file  Transportation Needs: Not on file  Physical Activity: Not on file  Stress: Not on file  Social Connections: Not on file  Intimate Partner Violence: Not on file    Review of Systems: See HPI, otherwise negative ROS  Physical Exam: BP (!) 181/74   Pulse 81   Temp 98.1 F (36.7 C) (Temporal)   Resp 16   Ht '5\' 3"'$  (1.6 m)   Wt 54.6 kg   SpO2 98%   BMI 21.31 kg/m  General:   Alert, cooperative in NAD Head:  Normocephalic and atraumatic. Respiratory:  Normal work of breathing. Cardiovascular:  RRR  Impression/Plan: Ashley Lynch is here for cataract surgery.  Risks, benefits, limitations, and alternatives regarding cataract surgery have been reviewed with the patient.  Questions have been answered.  All parties agreeable.   Birder Robson, MD  09/20/2021, 12:27 PM

## 2021-09-21 ENCOUNTER — Encounter: Payer: Self-pay | Admitting: Ophthalmology

## 2021-09-27 ENCOUNTER — Encounter: Payer: Self-pay | Admitting: Ophthalmology

## 2021-09-28 NOTE — Discharge Instructions (Signed)

## 2021-09-30 IMAGING — CT CT L SPINE W/O CM
4 series · 14 of 33 positions shown, 16 images · non-contrast
Comparison: Lumbar spine CT dated 08/24/2016.

CLINICAL DATA: 73-year-old female with low back pain. Concern for
fracture.

EXAM:
CT LUMBAR SPINE WITHOUT CONTRAST
TECHNIQUE: Multidetector CT imaging of the lumbar spine was performed without
intravenous contrast administration. Multiplanar CT image
reconstructions were also generated.

[Series 4: l spine soft · axial · 0.29mm/px · z∈[-327,-291]mm · 2 of 128 slices shown]
[im 19/128  soft-tissue]
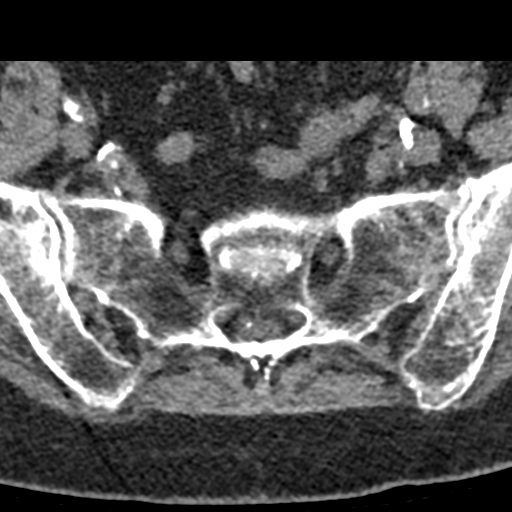
[im 37/128  soft-tissue]
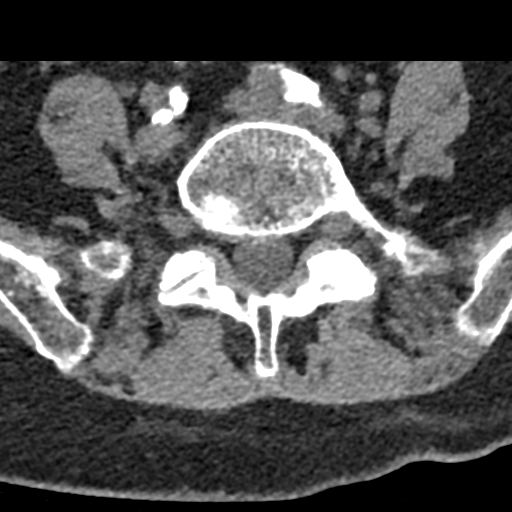

[Series 5: sagittal bone · sagittal · 0.39mm/px · 5 of 64 slices shown, 6 images]
[im 22/64  bone]
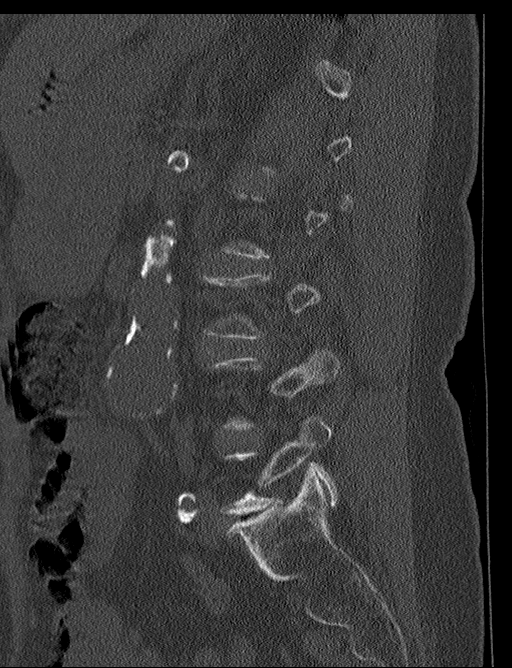
[im 27/64  bone]
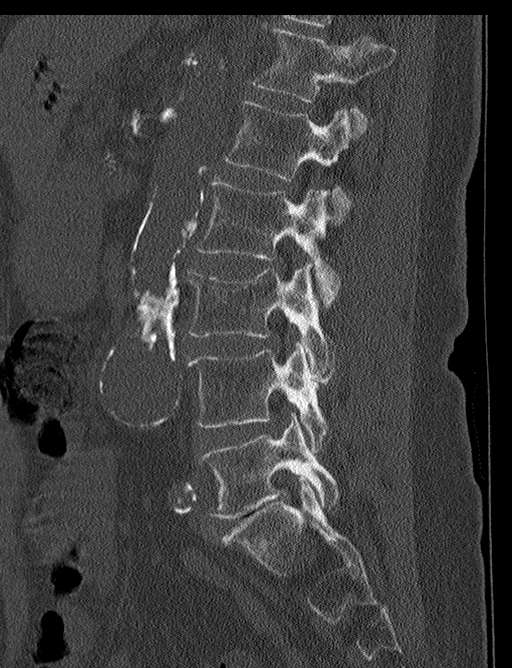
[im 32/64  soft-tissue]
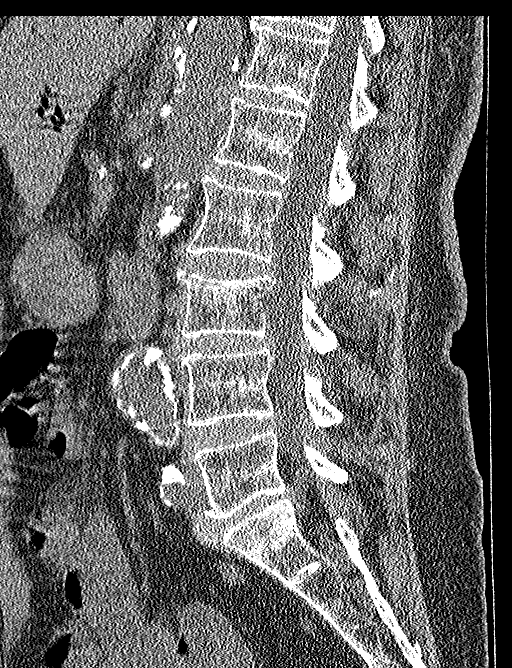
[im 32/64  bone]
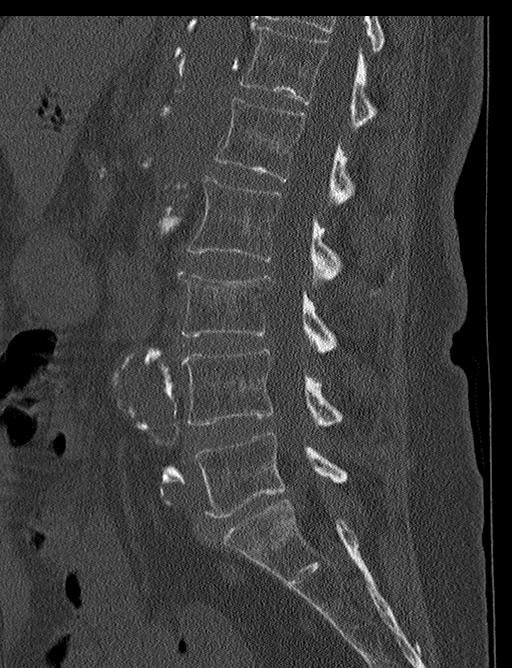
[im 37/64  bone]
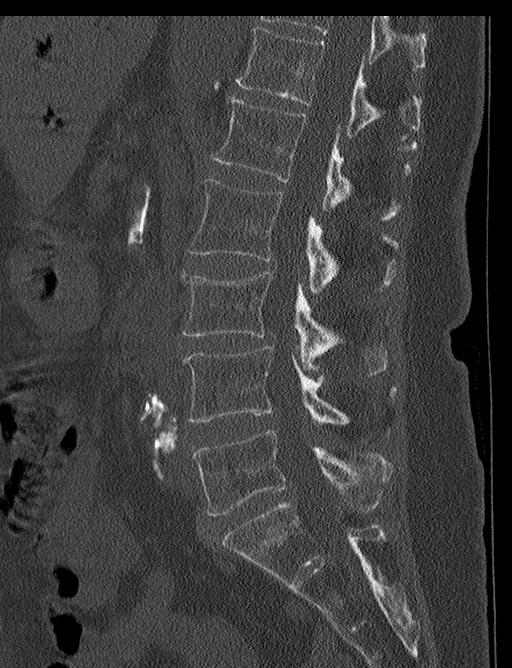
[im 43/64  bone]
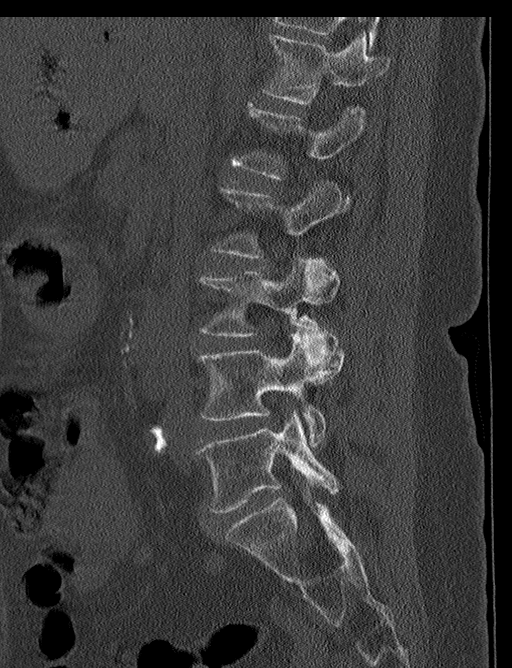

[Series 6: coronal bone · coronal · 0.36mm/px · 3 of 80 slices shown]
[im 16/80  bone]
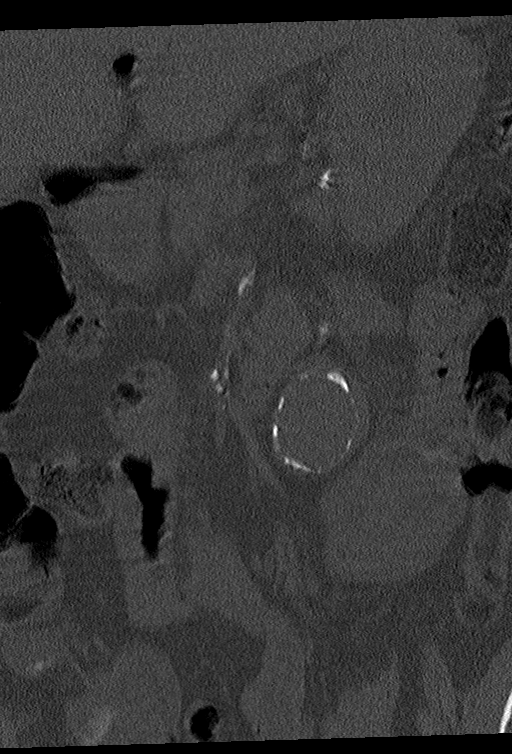
[im 32/80  bone]
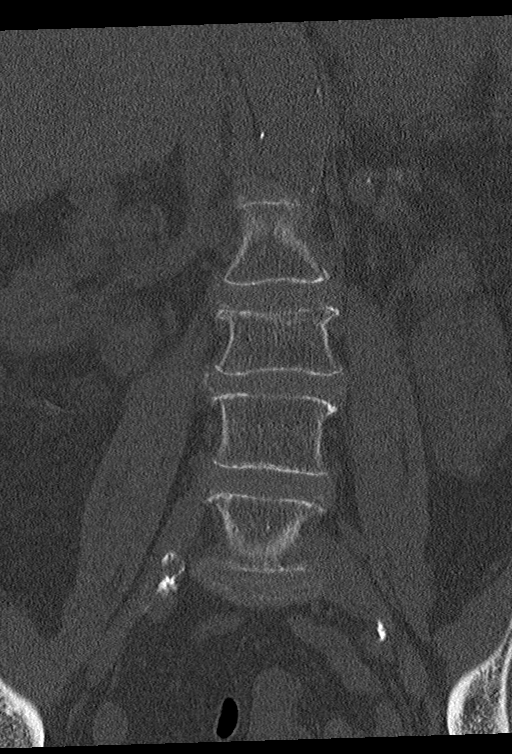
[im 48/80  bone]
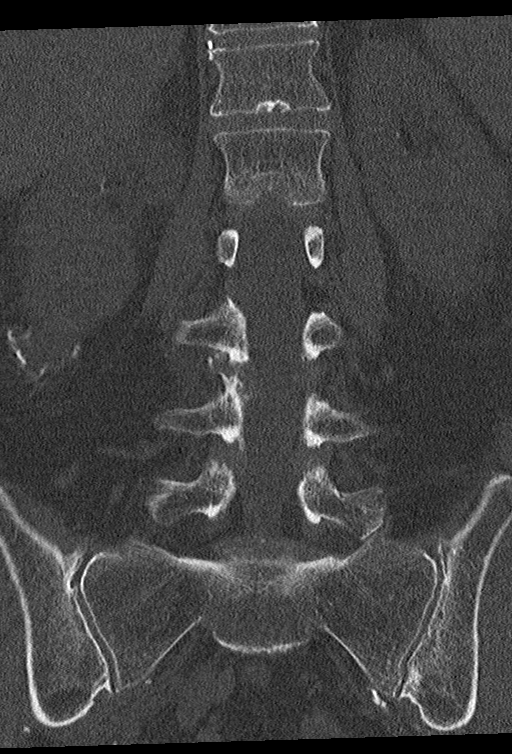

[Series 9: multi disc · axial · 0.21mm/px · z∈[-294,-149]mm · 4 of 98 slices shown, 5 images]
[im 20/98  soft-tissue]
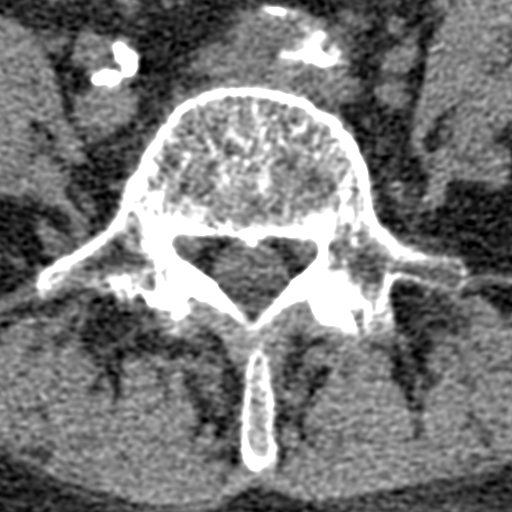
[im 20/98  bone]
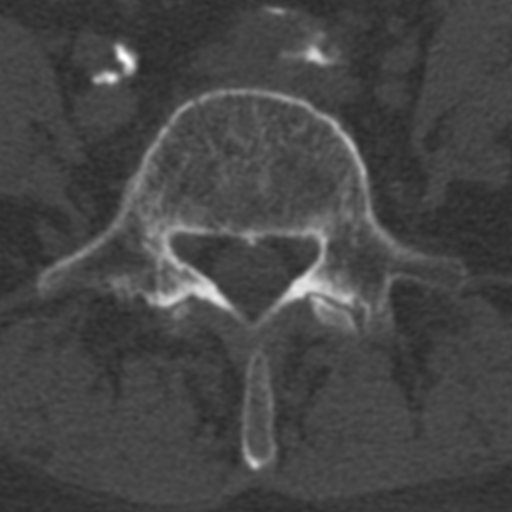
[im 39/98  bone]
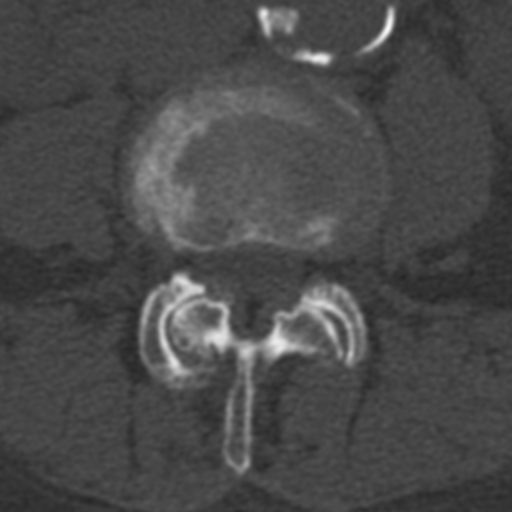
[im 59/98  bone]
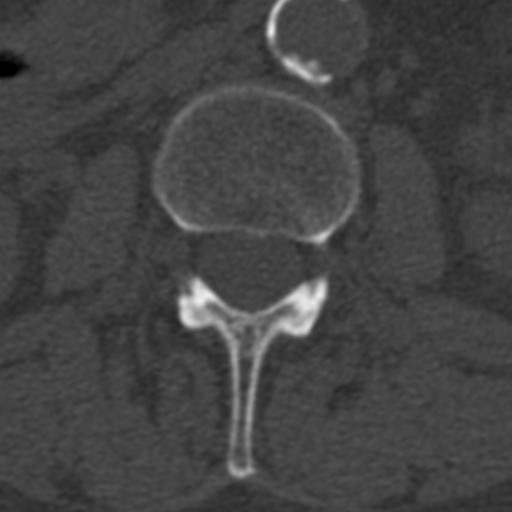
[im 78/98  bone]
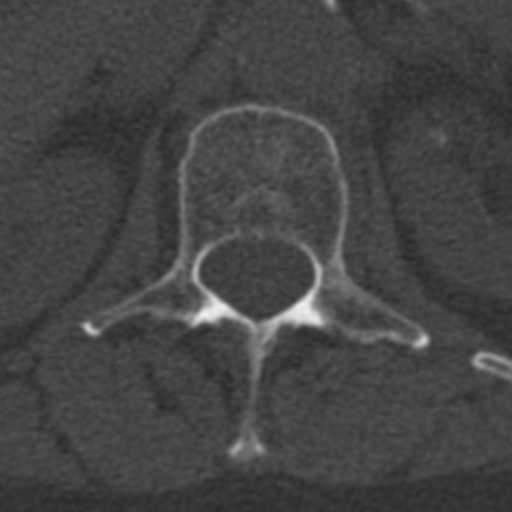

[14 of 33 positions shown; findings below may reference images not displayed]

FINDINGS: Segmentation: 5 lumbar type vertebrae.

Alignment: No acute subluxation.

Vertebrae: There is acute appearing compression fracture of the
superior endplate of L3 with approximately 10% loss of vertebral
body height, new since the prior CT. There is approximately 2 mm
retropulsion of the posterosuperior endplate of L3. No significant
associated spinal canal stenosis. No other acute fracture
identified. The visualized posterior elements are intact. There is
advanced osteopenia which limits evaluation for fracture.

Paraspinal and other soft tissues: No paraspinal fluid collection or
hematoma. There is advanced atherosclerotic calcification of the
abdominal aorta. There is a 3 cm aneurysmal dilatation of the distal
abdominal aorta. Renal vascular calcifications noted bilaterally.

Disc levels: There is degenerative changes primarily at L5-S1 with
disc desiccation and vacuum phenomena. There is disc space narrowing
and diffuse disc bulge at this level. Additional diffuse disc bulge
involving L2-L3, L3-L4, and L4-L5.
IMPRESSION: 1. Acute compression fracture of the superior endplate of L3 with
approximately 10% loss of vertebral body height and approximately 2
mm retropulsion of the posterosuperior endplate of L3. No
significant associated spinal canal stenosis.
2. Advanced osteopenia with multilevel degenerative changes.
3. A 3 cm aneurysmal dilatation of the distal abdominal aorta.
Recommend followup by ultrasound in 3 years. This recommendation
follows ACR consensus guidelines: White Paper of the ACR Incidental
[DATE]. Aortic aneurysm NOS (O4IEU-3N1.B)
4. Aortic Atherosclerosis (O4IEU-4FH.H).

## 2021-10-04 ENCOUNTER — Ambulatory Visit: Payer: Medicare PPO | Admitting: Anesthesiology

## 2021-10-04 ENCOUNTER — Encounter: Payer: Self-pay | Admitting: Ophthalmology

## 2021-10-04 ENCOUNTER — Other Ambulatory Visit: Payer: Self-pay

## 2021-10-04 ENCOUNTER — Ambulatory Visit
Admission: RE | Admit: 2021-10-04 | Discharge: 2021-10-04 | Disposition: A | Discharge status: Home / Self Care | Payer: Medicare PPO | Attending: Ophthalmology | Admitting: Ophthalmology

## 2021-10-04 ENCOUNTER — Encounter
Admission: RE | Disposition: A | Discharge status: Home / Self Care | Payer: Self-pay | Source: Home / Self Care | Attending: Ophthalmology

## 2021-10-04 DIAGNOSIS — H2511 Age-related nuclear cataract, right eye: Secondary | ICD-10-CM | POA: Insufficient documentation

## 2021-10-04 DIAGNOSIS — I739 Peripheral vascular disease, unspecified: Secondary | ICD-10-CM | POA: Diagnosis not present

## 2021-10-04 DIAGNOSIS — F17210 Nicotine dependence, cigarettes, uncomplicated: Secondary | ICD-10-CM | POA: Insufficient documentation

## 2021-10-04 DIAGNOSIS — H25811 Combined forms of age-related cataract, right eye: Secondary | ICD-10-CM | POA: Diagnosis not present

## 2021-10-04 DIAGNOSIS — I5022 Chronic systolic (congestive) heart failure: Secondary | ICD-10-CM | POA: Diagnosis not present

## 2021-10-04 DIAGNOSIS — I252 Old myocardial infarction: Secondary | ICD-10-CM | POA: Diagnosis not present

## 2021-10-04 DIAGNOSIS — Z79899 Other long term (current) drug therapy: Secondary | ICD-10-CM | POA: Insufficient documentation

## 2021-10-04 DIAGNOSIS — I110 Hypertensive heart disease with heart failure: Secondary | ICD-10-CM | POA: Diagnosis not present

## 2021-10-04 DIAGNOSIS — G459 Transient cerebral ischemic attack, unspecified: Secondary | ICD-10-CM | POA: Insufficient documentation

## 2021-10-04 HISTORY — PX: CATARACT EXTRACTION W/PHACO: SHX586

## 2021-10-04 SURGERY — PHACOEMULSIFICATION, CATARACT, WITH IOL INSERTION
Anesthesia: Monitor Anesthesia Care | Site: Eye | Laterality: Right

## 2021-10-04 MED ORDER — ACETAMINOPHEN 325 MG PO TABS
325.0000 mg | ORAL_TABLET | Freq: Once | ORAL | Status: AC
  Administered 2021-10-04: 650 mg via ORAL

## 2021-10-04 MED ORDER — MOXIFLOXACIN HCL 0.5 % OP SOLN
OPHTHALMIC | Status: DC | PRN
  Administered 2021-10-04: 0.2 mL via OPHTHALMIC

## 2021-10-04 MED ORDER — BRIMONIDINE TARTRATE-TIMOLOL 0.2-0.5 % OP SOLN
OPHTHALMIC | Status: DC | PRN
  Administered 2021-10-04: 1 [drp] via OPHTHALMIC

## 2021-10-04 MED ORDER — FENTANYL CITRATE (PF) 100 MCG/2ML IJ SOLN
INTRAMUSCULAR | Status: DC | PRN
  Administered 2021-10-04: 25 ug via INTRAVENOUS

## 2021-10-04 MED ORDER — ACETAMINOPHEN 160 MG/5ML PO SOLN: 325.0000 mg | Freq: Once | ORAL | Status: AC

## 2021-10-04 MED ORDER — MIDAZOLAM HCL 2 MG/2ML IJ SOLN
INTRAMUSCULAR | Status: DC | PRN
  Administered 2021-10-04: .5 mg via INTRAVENOUS

## 2021-10-04 MED ORDER — SIGHTPATH DOSE#1 BSS IO SOLN
INTRAOCULAR | Status: DC | PRN
  Administered 2021-10-04: 62 mL via OPHTHALMIC

## 2021-10-04 MED ORDER — SIGHTPATH DOSE#1 NA CHONDROIT SULF-NA HYALURON 40-17 MG/ML IO SOLN
INTRAOCULAR | Status: DC | PRN
  Administered 2021-10-04: 1 mL via INTRAOCULAR

## 2021-10-04 MED ORDER — SIGHTPATH DOSE#1 BSS IO SOLN
INTRAOCULAR | Status: DC | PRN
  Administered 2021-10-04: 15 mL

## 2021-10-04 MED ORDER — LACTATED RINGERS IV SOLN: INTRAVENOUS | Status: DC

## 2021-10-04 MED ORDER — TETRACAINE HCL 0.5 % OP SOLN
OPHTHALMIC | Status: DC | PRN
  Administered 2021-10-04: 1 [drp] via OPHTHALMIC

## 2021-10-04 MED ORDER — TETRACAINE HCL 0.5 % OP SOLN
1.0000 [drp] | OPHTHALMIC | Status: DC | PRN
  Administered 2021-10-04 (×3): 1 [drp] via OPHTHALMIC

## 2021-10-04 MED ORDER — SIGHTPATH DOSE#1 BSS IO SOLN
INTRAOCULAR | Status: DC | PRN
  Administered 2021-10-04: 1 mL via INTRAMUSCULAR

## 2021-10-04 MED ORDER — ARMC OPHTHALMIC DILATING DROPS
1.0000 "application " | OPHTHALMIC | Status: DC | PRN
  Administered 2021-10-04 (×3): 1 via OPHTHALMIC

## 2021-10-04 SURGICAL SUPPLY — 10 items
CATARACT SUITE SIGHTPATH (MISCELLANEOUS) ×2 IMPLANT
FEE CATARACT SUITE SIGHTPATH (MISCELLANEOUS) ×1 IMPLANT
GLOVE SURG ENC TEXT LTX SZ8 (GLOVE) ×2 IMPLANT
GLOVE SURG TRIUMPH 8.0 PF LTX (GLOVE) ×2 IMPLANT
LENS IOL TECNIS EYHANCE 21.5 (Intraocular Lens) ×1 IMPLANT
NDL FILTER BLUNT 18X1 1/2 (NEEDLE) ×1 IMPLANT
NEEDLE FILTER BLUNT 18X 1/2SAF (NEEDLE) ×1
NEEDLE FILTER BLUNT 18X1 1/2 (NEEDLE) ×1 IMPLANT
SYR 3ML LL SCALE MARK (SYRINGE) ×2 IMPLANT
WATER STERILE IRR 250ML POUR (IV SOLUTION) ×2 IMPLANT

## 2021-10-04 NOTE — Op Note (Signed)
PREOPERATIVE DIAGNOSIS:  Nuclear sclerotic cataract of the right eye.   POSTOPERATIVE DIAGNOSIS:  H25.11 Cataract   OPERATIVE PROCEDURE:ORPROCALL@   SURGEON:  Birder Robson, MD.   ANESTHESIA:  Anesthesiologist: Ronelle Nigh, MD CRNA: Dionne Bucy, CRNA  1.      Managed anesthesia care. 2.      0.78m of Shugarcaine was instilled in the eye following the paracentesis.   COMPLICATIONS:  None.   TECHNIQUE:   Stop and chop   DESCRIPTION OF PROCEDURE:  The patient was examined and consented in the preoperative holding area where the aforementioned topical anesthesia was applied to the right eye and then brought back to the Operating Room where the right eye was prepped and draped in the usual sterile ophthalmic fashion and a lid speculum was placed. A paracentesis was created with the side port blade and the anterior chamber was filled with viscoelastic. A near clear corneal incision was performed with the steel keratome. A continuous curvilinear capsulorrhexis was performed with a cystotome followed by the capsulorrhexis forceps. Hydrodissection and hydrodelineation were carried out with BSS on a blunt cannula. The lens was removed in a stop and chop  technique and the remaining cortical material was removed with the irrigation-aspiration handpiece. The capsular bag was inflated with viscoelastic and the Technis [id number]  lens was placed in the capsular bag without complication. The remaining viscoelastic was removed from the eye with the irrigation-aspiration handpiece. The wounds were hydrated. The anterior chamber was flushed with BSS and the eye was inflated to physiologic pressure. 0.151mof Vigamox was placed in the anterior chamber. The wounds were found to be water tight. The eye was dressed with Combigan. The patient was given protective glasses to wear throughout the day and a shield with which to sleep tonight. The patient was also given drops with which to begin a drop regimen today  and will follow-up with me in one day. Implant Name Type Inv. Item Serial No. Manufacturer Lot No. LRB No. Used Action  LENS IOL TECNIS EYHANCE 21.5 - [id number] Lens LENS IOL TECNIS EYHANCE 21.5 [id number]  Right 1 Implanted   Procedure(s): CATARACT EXTRACTION PHACO AND INTRAOCULAR LENS PLACEMENT (IOC) RIGHT  5.68 00:44.8 (Right)  Electronically signed: WiBirder Robson/20/2023 8:17 AM

## 2021-10-04 NOTE — H&P (Signed)
[place]   Primary Care Physician:  Perrin Maltese, MD Ophthalmologist: Dr. George Ina  Pre-Procedure History & Physical: HPI:  Ashley Lynch is a 76 y.o. female here for cataract surgery.   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[place])    Myocardial infarction ([place]) 04/23/2021   NSTEMI   PAD (peripheral artery disease) [facility])     Past Surgical History:  Procedure Laterality Date   ABDOMINAL HYSTERECTOMY  04/18/2011   CARDIAC CATHETERIZATION  04/25/2021   CATARACT EXTRACTION W/PHACO Left 09/20/2021   Procedure: CATARACT EXTRACTION PHACO AND INTRAOCULAR LENS PLACEMENT (IOC) LEFT  18.07 01:25.3;  Surgeon: Birder Robson, MD;  Location: [facility];  Service: Ophthalmology;  Laterality: Left;   [place]   LEFT HEART CATH AND CORONARY ANGIOGRAPHY N/A 01/13/2020   Procedure: LEFT HEART CATH AND CORONARY ANGIOGRAPHY;  Surgeon: Dionisio David, MD;  Location: [place] [facility];  Service: Cardiovascular;  Laterality: N/A;   TONSILLECTOMY  04/17/2008    Prior to Admission medications   Medication Sig Start Date End Date Taking? Authorizing Provider  benazepril (LOTENSIN) 20 MG tablet Take 20 mg by mouth daily. 01/08/20  Yes [provider]  MAGNESIUM PO Take by mouth.   Yes [provider]  sertraline (ZOLOFT) 25 MG tablet Take 25 mg by mouth at bedtime.   Yes [provider]  amLODipine (NORVASC) 5 MG tablet Take 1 tablet each morning Patient not taking: Reported on 08/16/2021 06/19/16   Arlis Porta., MD  aspirin 81 MG tablet Take 81 mg by mouth daily. Patient  not taking: Reported on 09/19/2021    [provider]  atorvastatin (LIPITOR) 80 MG tablet Take 80 mg by mouth. Patient not taking: Reported on 09/19/2021    [provider]  clopidogrel (PLAVIX) 75 MG tablet Take 75 mg by mouth daily. Patient not taking: Reported on 10/04/2021    [provider]  fluticasone Asencion Islam) 50 MCG/ACT nasal spray instill 2 sprays into each nostril once daily Patient not taking: Reported on 09/19/2021 01/11/16   Arlis Porta., MD  furosemide (LASIX) 20 MG tablet Take 20 mg by mouth daily. Patient not taking: Reported on 09/19/2021 12/11/19   [provider]  metoprolol succinate (TOPROL-XL) 25 MG 24 hr tablet Take 25 mg by mouth daily. Patient not taking: Reported on 08/16/2021    [provider]  omeprazole (PRILOSEC) 20 MG capsule Take 1 capsule (20 mg total) by mouth daily. Patient not taking: Reported on 08/16/2021 11/27/16   Olin Hauser, DO  rosuvastatin (CRESTOR) 10 MG tablet Take 10 mg by mouth daily. Patient not taking: Reported on 08/16/2021 09/02/19   [provider]  sacubitril-valsartan (ENTRESTO) 24-26 MG Take 1 tablet by mouth 2 (two) times daily. Patient not taking: Reported on 08/16/2021    [provider]  ticagrelor (BRILINTA) 90 MG TABS tablet Take 90 mg by mouth. Patient not taking: Reported on 08/16/2021    [provider]  traMADol Veatrice Bourbon)  50 MG tablet Take 50 mg by mouth 3 (three) times daily as needed. Patient not taking: Reported on 08/16/2021 12/25/19   [provider]  vitamin B-12 (CYANOCOBALAMIN) 1000 MCG tablet Take 1,000 mcg by mouth daily. Patient not taking: Reported on 08/16/2021    [provider]    Allergies as of 08/25/2021   (No Known Allergies)    Family History  Problem Relation Age of Onset   Diabetes Mother        sibling and grandparent also    Social History   Socioeconomic History   Marital status: Widowed    Spouse name:  Not on file   Number of children: Not on file   Years of education: Not on file   Highest education level: Not on file  Occupational History   Not on file  Tobacco Use   Smoking status: Every Day    Packs/day: 1.00    Years: 60.00    Total pack years: 60.00    Types: Cigarettes   Smokeless tobacco: Never  Vaping Use   Vaping Use: Never used  Substance and Sexual Activity   Alcohol use: No   Drug use: No   Sexual activity: Not on file  Other Topics Concern   Not on file  Social History Narrative   Not on file   Social Determinants of Health   Financial Resource Strain: Not on file  Food Insecurity: Not on file  Transportation Needs: Not on file  Physical Activity: Not on file  Stress: Not on file  Social Connections: Not on file  Intimate Partner Violence: Not on file    Review of Systems: See HPI, otherwise negative ROS  Physical Exam: BP (!) 176/77   Pulse 81   Temp (!) 97.2 F (36.2 C) (Temporal)   Ht '5\' 3"'$  (1.6 m)   Wt 53.4 kg   SpO2 98%   BMI 20.85 kg/m  General:   Alert, cooperative in NAD Head:  Normocephalic and atraumatic. Respiratory:  Normal work of breathing. Cardiovascular:  RRR  Impression/Plan: Ashley Lynch is here for cataract surgery.  Risks, benefits, limitations, and alternatives regarding cataract surgery have been reviewed with the patient.  Questions have been answered.  All parties agreeable.   Birder Robson, MD  10/04/2021, 7:52 AM

## 2021-10-04 NOTE — Anesthesia Procedure Notes (Signed)
Procedure Name: MAC Date/Time: 10/04/2021 7:59 AM  Performed by: Dionne Bucy, CRNAPre-anesthesia Checklist: Patient identified, Emergency Drugs available, Suction available, Patient being monitored and Timeout performed Patient Re-evaluated:Patient Re-evaluated prior to induction Oxygen Delivery Method: Nasal cannula Placement Confirmation: positive ETCO2

## 2021-10-04 NOTE — Anesthesia Postprocedure Evaluation (Signed)
Anesthesia Post Note  Patient: Ashley Lynch  Procedure(s) Performed: CATARACT EXTRACTION PHACO AND INTRAOCULAR LENS PLACEMENT (IOC) RIGHT  5.68 00:44.8 (Right: Eye)     Patient location during evaluation: PACU Anesthesia Type: MAC Level of consciousness: awake and alert and oriented Pain management: satisfactory to patient Vital Signs Assessment: post-procedure vital signs reviewed and stable Respiratory status: spontaneous breathing, nonlabored ventilation and respiratory function stable Cardiovascular status: blood pressure returned to baseline and stable Postop Assessment: Adequate PO intake and No signs of nausea or vomiting Anesthetic complications: no   No notable events documented.  Raliegh Ip

## 2021-10-04 NOTE — Anesthesia Preprocedure Evaluation (Signed)
Anesthesia Evaluation  Patient identified by MRN, date of birth, ID band Patient awake    Reviewed: Allergy & Precautions, H&P , NPO status , Patient's Chart, lab work & pertinent test results  Airway Mallampati: II  TM Distance: >3 FB Neck ROM: full    Dental no notable dental hx.    Pulmonary Current SmokerPatient did not abstain from smoking.,    Pulmonary exam normal breath sounds clear to auscultation       Cardiovascular hypertension, On Medications + CAD, + Past MI, + Peripheral Vascular Disease and +CHF  Normal cardiovascular exam Rhythm:regular Rate:Normal     Neuro/Psych TIA   GI/Hepatic negative GI ROS, Neg liver ROS,   Endo/Other  negative endocrine ROS  Renal/GU      Musculoskeletal   Abdominal   Peds  Hematology  (+) Blood dyscrasia, anemia ,   Anesthesia Other Findings   Reproductive/Obstetrics                             Anesthesia Physical  Anesthesia Plan  ASA: 3  Anesthesia Plan: MAC   Post-op Pain Management: Minimal or no pain anticipated   Induction:   PONV Risk Score and Plan: 1 and TIVA, Midazolam and Treatment may vary due to age or medical condition  Airway Management Planned:   Additional Equipment:   Intra-op Plan:   Post-operative Plan:   Informed Consent: I have reviewed the patients History and Physical, chart, labs and discussed the procedure including the risks, benefits and alternatives for the proposed anesthesia with the patient or authorized representative who has indicated his/her understanding and acceptance.     Dental Advisory Given  Plan Discussed with: CRNA  Anesthesia Plan Comments:         Anesthesia Quick Evaluation

## 2021-10-04 NOTE — Transfer of Care (Signed)
Immediate Anesthesia Transfer of Care Note  Patient: Ashley Lynch  Procedure(s) Performed: CATARACT EXTRACTION PHACO AND INTRAOCULAR LENS PLACEMENT (IOC) RIGHT  5.68 00:44.8 (Right: Eye)  Patient Location: PACU  Anesthesia Type: MAC  Level of Consciousness: awake, alert  and patient cooperative  Airway and Oxygen Therapy: Patient Spontanous Breathing and Patient connected to supplemental oxygen  Post-op Assessment: Post-op Vital signs reviewed, Patient's Cardiovascular Status Stable, Respiratory Function Stable, Patent Airway and No signs of Nausea or vomiting  Post-op Vital Signs: Reviewed and stable  Complications: No notable events documented.

## 2021-10-05 ENCOUNTER — Encounter: Payer: Self-pay | Admitting: Ophthalmology

## 2021-12-14 DIAGNOSIS — E782 Mixed hyperlipidemia: Secondary | ICD-10-CM | POA: Diagnosis not present

## 2021-12-14 DIAGNOSIS — R9431 Abnormal electrocardiogram [ECG] [EKG]: Secondary | ICD-10-CM | POA: Diagnosis not present

## 2021-12-14 DIAGNOSIS — R079 Chest pain, unspecified: Secondary | ICD-10-CM | POA: Diagnosis not present

## 2021-12-14 DIAGNOSIS — I10 Essential (primary) hypertension: Secondary | ICD-10-CM | POA: Diagnosis not present

## 2021-12-19 ENCOUNTER — Inpatient Hospital Stay
Admission: EM | Admit: 2021-12-19 | Discharge: 2021-12-22 | DRG: 291 | Disposition: A | Discharge status: Home / Self Care | Payer: Medicare PPO | Attending: Internal Medicine | Admitting: Internal Medicine

## 2021-12-19 ENCOUNTER — Emergency Department: Payer: Medicare PPO

## 2021-12-19 ENCOUNTER — Other Ambulatory Visit: Payer: Self-pay

## 2021-12-19 ENCOUNTER — Encounter: Payer: Self-pay | Admitting: Internal Medicine

## 2021-12-19 ENCOUNTER — Inpatient Hospital Stay: Payer: Medicare PPO

## 2021-12-19 DIAGNOSIS — R0602 Shortness of breath: Secondary | ICD-10-CM | POA: Diagnosis not present

## 2021-12-19 DIAGNOSIS — Z8619 Personal history of other infectious and parasitic diseases: Secondary | ICD-10-CM | POA: Diagnosis not present

## 2021-12-19 DIAGNOSIS — Z833 Family history of diabetes mellitus: Secondary | ICD-10-CM | POA: Diagnosis not present

## 2021-12-19 DIAGNOSIS — I110 Hypertensive heart disease with heart failure: Principal | ICD-10-CM | POA: Diagnosis present

## 2021-12-19 DIAGNOSIS — Z79899 Other long term (current) drug therapy: Secondary | ICD-10-CM

## 2021-12-19 DIAGNOSIS — I255 Ischemic cardiomyopathy: Secondary | ICD-10-CM | POA: Diagnosis not present

## 2021-12-19 DIAGNOSIS — F32A Depression, unspecified: Secondary | ICD-10-CM | POA: Diagnosis not present

## 2021-12-19 DIAGNOSIS — J811 Chronic pulmonary edema: Secondary | ICD-10-CM | POA: Diagnosis not present

## 2021-12-19 DIAGNOSIS — Z8572 Personal history of non-Hodgkin lymphomas: Secondary | ICD-10-CM | POA: Diagnosis not present

## 2021-12-19 DIAGNOSIS — I509 Heart failure, unspecified: Secondary | ICD-10-CM | POA: Diagnosis not present

## 2021-12-19 DIAGNOSIS — M19011 Primary osteoarthritis, right shoulder: Secondary | ICD-10-CM | POA: Diagnosis not present

## 2021-12-19 DIAGNOSIS — Z9842 Cataract extraction status, left eye: Secondary | ICD-10-CM | POA: Diagnosis not present

## 2021-12-19 DIAGNOSIS — Z955 Presence of coronary angioplasty implant and graft: Secondary | ICD-10-CM

## 2021-12-19 DIAGNOSIS — R079 Chest pain, unspecified: Secondary | ICD-10-CM | POA: Diagnosis not present

## 2021-12-19 DIAGNOSIS — Z961 Presence of intraocular lens: Secondary | ICD-10-CM | POA: Diagnosis present

## 2021-12-19 DIAGNOSIS — Z90710 Acquired absence of both cervix and uterus: Secondary | ICD-10-CM | POA: Diagnosis not present

## 2021-12-19 DIAGNOSIS — I739 Peripheral vascular disease, unspecified: Secondary | ICD-10-CM | POA: Diagnosis present

## 2021-12-19 DIAGNOSIS — I10 Essential (primary) hypertension: Secondary | ICD-10-CM | POA: Diagnosis present

## 2021-12-19 DIAGNOSIS — Z9841 Cataract extraction status, right eye: Secondary | ICD-10-CM

## 2021-12-19 DIAGNOSIS — I5043 Acute on chronic combined systolic (congestive) and diastolic (congestive) heart failure: Secondary | ICD-10-CM | POA: Diagnosis not present

## 2021-12-19 DIAGNOSIS — L89152 Pressure ulcer of sacral region, stage 2: Secondary | ICD-10-CM | POA: Diagnosis not present

## 2021-12-19 DIAGNOSIS — J9811 Atelectasis: Secondary | ICD-10-CM | POA: Diagnosis not present

## 2021-12-19 DIAGNOSIS — F17200 Nicotine dependence, unspecified, uncomplicated: Secondary | ICD-10-CM | POA: Diagnosis not present

## 2021-12-19 DIAGNOSIS — I2510 Atherosclerotic heart disease of native coronary artery without angina pectoris: Secondary | ICD-10-CM | POA: Diagnosis present

## 2021-12-19 DIAGNOSIS — F17210 Nicotine dependence, cigarettes, uncomplicated: Secondary | ICD-10-CM | POA: Diagnosis present

## 2021-12-19 DIAGNOSIS — I252 Old myocardial infarction: Secondary | ICD-10-CM | POA: Diagnosis not present

## 2021-12-19 DIAGNOSIS — I5023 Acute on chronic systolic (congestive) heart failure: Secondary | ICD-10-CM | POA: Diagnosis present

## 2021-12-19 DIAGNOSIS — I5021 Acute systolic (congestive) heart failure: Secondary | ICD-10-CM | POA: Diagnosis not present

## 2021-12-19 DIAGNOSIS — L8990 Pressure ulcer of unspecified site, unspecified stage: Secondary | ICD-10-CM | POA: Insufficient documentation

## 2021-12-19 LAB — CBC WITH DIFFERENTIAL/PLATELET
Abs Immature Granulocytes: 0.02 10*3/uL (ref 0.00–0.07)
Basophils Absolute: 0.1 10*3/uL (ref 0.0–0.1)
Basophils Relative: 1 %
Eosinophils Absolute: 0.1 10*3/uL (ref 0.0–0.5)
Eosinophils Relative: 1 %
HCT: 31.7 % — ABNORMAL LOW (ref 36.0–46.0)
Hemoglobin: 10.4 g/dL — ABNORMAL LOW (ref 12.0–15.0)
Immature Granulocytes: 0 %
Lymphocytes Relative: 17 %
Lymphs Abs: 1.4 10*3/uL (ref 0.7–4.0)
MCH: 30.1 pg (ref 26.0–34.0)
MCHC: 32.8 g/dL (ref 30.0–36.0)
MCV: 91.6 fL (ref 80.0–100.0)
Monocytes Absolute: 0.8 10*3/uL (ref 0.1–1.0)
Monocytes Relative: 10 %
Neutro Abs: 5.6 10*3/uL (ref 1.7–7.7)
Neutrophils Relative %: 71 %
Platelets: 193 10*3/uL (ref 150–400)
RBC: 3.46 MIL/uL — ABNORMAL LOW (ref 3.87–5.11)
RDW: 17.2 % — ABNORMAL HIGH (ref 11.5–15.5)
WBC: 8 10*3/uL (ref 4.0–10.5)
nRBC: 0 % (ref 0.0–0.2)

## 2021-12-19 LAB — COMPREHENSIVE METABOLIC PANEL
ALT: 25 U/L (ref 0–44)
AST: 38 U/L (ref 15–41)
Albumin: 3.4 g/dL — ABNORMAL LOW (ref 3.5–5.0)
Alkaline Phosphatase: 182 U/L — ABNORMAL HIGH (ref 38–126)
Anion gap: 9 (ref 5–15)
BUN: 37 mg/dL — ABNORMAL HIGH (ref 8–23)
CO2: 21 mmol/L — ABNORMAL LOW (ref 22–32)
Calcium: 8.9 mg/dL (ref 8.9–10.3)
Chloride: 110 mmol/L (ref 98–111)
Creatinine, Ser: 1.54 mg/dL — ABNORMAL HIGH (ref 0.44–1.00)
GFR, Estimated: 35 mL/min — ABNORMAL LOW (ref >60)
Glucose, Bld: 114 mg/dL — ABNORMAL HIGH (ref 70–99)
Potassium: 4.5 mmol/L (ref 3.5–5.1)
Sodium: 140 mmol/L (ref 135–145)
Total Bilirubin: 1.2 mg/dL (ref 0.3–1.2)
Total Protein: 7.4 g/dL (ref 6.5–8.1)

## 2021-12-19 LAB — LACTIC ACID, PLASMA
Lactic Acid, Venous: 1.4 mmol/L (ref 0.5–1.9)
Lactic Acid, Venous: 1.9 mmol/L (ref 0.5–1.9)

## 2021-12-19 LAB — URINALYSIS, ROUTINE W REFLEX MICROSCOPIC
Bacteria, UA: NONE SEEN
Bilirubin Urine: NEGATIVE
Glucose, UA: NEGATIVE mg/dL
Ketones, ur: NEGATIVE mg/dL
Nitrite: NEGATIVE
Protein, ur: 100 mg/dL — AB
Specific Gravity, Urine: 1.02 (ref 1.005–1.030)
pH: 5 (ref 5.0–8.0)

## 2021-12-19 LAB — PROCALCITONIN: Procalcitonin: 0.16 ng/mL

## 2021-12-19 LAB — TROPONIN I (HIGH SENSITIVITY)
Troponin I (High Sensitivity): 64 ng/L — ABNORMAL HIGH (ref <18)
Troponin I (High Sensitivity): 71 ng/L — ABNORMAL HIGH (ref <18)

## 2021-12-19 LAB — BRAIN NATRIURETIC PEPTIDE: B Natriuretic Peptide: >4500 pg/mL — ABNORMAL HIGH (ref 0.0–100.0)

## 2021-12-19 LAB — GLUCOSE, CAPILLARY: Glucose-Capillary: 126 mg/dL — ABNORMAL HIGH (ref 70–99)

## 2021-12-19 MED ORDER — ATORVASTATIN CALCIUM 80 MG PO TABS
80.0000 mg | ORAL_TABLET | Freq: Every evening | ORAL | Status: DC
  Administered 2021-12-19 – 2021-12-21 (×3): 80 mg via ORAL
  Filled 2021-12-19 (×3): qty 1

## 2021-12-19 MED ORDER — SERTRALINE HCL 50 MG PO TABS
50.0000 mg | ORAL_TABLET | Freq: Every day | ORAL | Status: DC
  Administered 2021-12-19 – 2021-12-22 (×4): 50 mg via ORAL
  Filled 2021-12-19 (×4): qty 1

## 2021-12-19 MED ORDER — NITROGLYCERIN 0.4 MG SL SUBL
0.4000 mg | SUBLINGUAL_TABLET | Freq: Every day | SUBLINGUAL | Status: DC | PRN
Start: 2021-12-19 — End: 2021-12-22

## 2021-12-19 MED ORDER — FUROSEMIDE 10 MG/ML IJ SOLN
40.0000 mg | Freq: Two times a day (BID) | INTRAMUSCULAR | Status: DC
  Administered 2021-12-19 – 2021-12-21 (×4): 40 mg via INTRAVENOUS
  Filled 2021-12-19 (×3): qty 4

## 2021-12-19 MED ORDER — ASPIRIN 81 MG PO CHEW
324.0000 mg | CHEWABLE_TABLET | Freq: Once | ORAL | Status: AC
  Administered 2021-12-19: 324 mg via ORAL
  Filled 2021-12-19: qty 4

## 2021-12-19 MED ORDER — MIRABEGRON ER 25 MG PO TB24
25.0000 mg | ORAL_TABLET | Freq: Every day | ORAL | Status: DC
  Administered 2021-12-19 – 2021-12-22 (×4): 25 mg via ORAL
  Filled 2021-12-19 (×4): qty 1

## 2021-12-19 MED ORDER — ASPIRIN 81 MG PO TBEC
81.0000 mg | DELAYED_RELEASE_TABLET | Freq: Every day | ORAL | Status: DC
  Administered 2021-12-20 – 2021-12-22 (×3): 81 mg via ORAL
  Filled 2021-12-19 (×3): qty 1

## 2021-12-19 MED ORDER — NITROGLYCERIN 2 % TD OINT
1.0000 [in_us] | TOPICAL_OINTMENT | Freq: Once | TRANSDERMAL | Status: AC
  Administered 2021-12-19: 1 [in_us] via TOPICAL
  Filled 2021-12-19: qty 1

## 2021-12-19 MED ORDER — ASPIRIN 81 MG PO TABS: 81.0000 mg | ORAL_TABLET | Freq: Every day | ORAL | Status: DC

## 2021-12-19 MED ORDER — METOPROLOL SUCCINATE ER 25 MG PO TB24
25.0000 mg | ORAL_TABLET | Freq: Every day | ORAL | Status: DC
  Administered 2021-12-19 – 2021-12-22 (×4): 25 mg via ORAL
  Filled 2021-12-19 (×4): qty 1

## 2021-12-19 MED ORDER — NICOTINE 14 MG/24HR TD PT24
14.0000 mg | MEDICATED_PATCH | Freq: Every day | TRANSDERMAL | Status: DC
Start: 2021-12-20 — End: 2021-12-22
  Administered 2021-12-20 – 2021-12-22 (×3): 14 mg via TRANSDERMAL
  Filled 2021-12-19 (×4): qty 1

## 2021-12-19 MED ORDER — CLOPIDOGREL BISULFATE 75 MG PO TABS
75.0000 mg | ORAL_TABLET | Freq: Every day | ORAL | Status: DC
  Administered 2021-12-19 – 2021-12-22 (×4): 75 mg via ORAL
  Filled 2021-12-19 (×4): qty 1

## 2021-12-19 MED ORDER — ACETAMINOPHEN 650 MG RE SUPP: 650.0000 mg | Freq: Four times a day (QID) | RECTAL | Status: DC | PRN

## 2021-12-19 MED ORDER — ACETAMINOPHEN 325 MG PO TABS
650.0000 mg | ORAL_TABLET | Freq: Four times a day (QID) | ORAL | Status: DC | PRN
  Administered 2021-12-21: 650 mg via ORAL
  Filled 2021-12-19 (×2): qty 2

## 2021-12-19 MED ORDER — TRAMADOL HCL 50 MG PO TABS
50.0000 mg | ORAL_TABLET | Freq: Three times a day (TID) | ORAL | Status: DC | PRN
  Administered 2021-12-19 – 2021-12-21 (×3): 50 mg via ORAL
  Filled 2021-12-19 (×3): qty 1

## 2021-12-19 MED ORDER — FUROSEMIDE 10 MG/ML IJ SOLN
40.0000 mg | Freq: Once | INTRAMUSCULAR | Status: AC
  Administered 2021-12-19: 40 mg via INTRAVENOUS
  Filled 2021-12-19: qty 4

## 2021-12-19 MED ORDER — ENALAPRILAT 1.25 MG/ML IV SOLN
0.6250 mg | Freq: Once | INTRAVENOUS | Status: DC
  Filled 2021-12-19: qty 2

## 2021-12-19 MED ORDER — PANTOPRAZOLE SODIUM 40 MG PO TBEC
40.0000 mg | DELAYED_RELEASE_TABLET | Freq: Every day | ORAL | Status: DC
  Administered 2021-12-19 – 2021-12-22 (×4): 40 mg via ORAL
  Filled 2021-12-19 (×4): qty 1

## 2021-12-19 MED ORDER — SACUBITRIL-VALSARTAN 24-26 MG PO TABS
1.0000 | ORAL_TABLET | Freq: Two times a day (BID) | ORAL | Status: DC
  Administered 2021-12-20 – 2021-12-22 (×5): 1 via ORAL
  Filled 2021-12-19 (×5): qty 1

## 2021-12-19 MED ORDER — ENOXAPARIN SODIUM 30 MG/0.3ML IJ SOSY
30.0000 mg | PREFILLED_SYRINGE | INTRAMUSCULAR | Status: DC
  Administered 2021-12-19 – 2021-12-21 (×3): 30 mg via SUBCUTANEOUS
  Filled 2021-12-19 (×3): qty 0.3

## 2021-12-19 NOTE — Plan of Care (Signed)

## 2021-12-19 NOTE — Assessment & Plan Note (Signed)
Treatment as outlined in 2 

## 2021-12-19 NOTE — Assessment & Plan Note (Addendum)
--  cont Toprol and Entresto --cont IV lasix Hold amlodipine due to worsening lower extremity swelling

## 2021-12-19 NOTE — ED Provider Notes (Signed)
[facility] Provider Note    Event Date/Time   First MD Initiated Contact with Patient 12/19/21 [phone]     (approximate)   History   Chest Pain and Shortness of Breath   HPI  Ashley Lynch is a 76 y.o. female  past medical history of heart failure reduced EF, lymphoma peripheral artery disease, coronary disease with three-vessel disease but not good surgical candidate so had PCI, heart failure who presents with shortness of breath and chest pain.  Patient notes that her symptoms been going on for about 1 week.  She feels short of breath at rest and with exertion.  Feels short of breath lying flat but that is chronic for her.  She does endorse increasing lower extremity swelling.  Denies any change in her weight.  When asked specifically she does endorse chest pain she has difficulty telling me exactly when it started but it is fairly constant somewhat in between sharp and dull nonradiating nonpleuritic.  She has had mild cough that is nonproductive no fevers chills.  Has had some nausea but no vomiting no diarrhea.  She she is not on a diuretic but I see furosemide in her medication list.  Past Medical History:  Diagnosis Date   Cancer ([place])    lymphoma   Cardiomyopathy ([place])    Complication of anesthesia    slow to wake   H/O heart artery stent 12/2019   X2   HFrEF (heart failure with reduced ejection fraction) (HCC)    History of chicken pox    History of measles, mumps, or rubella    HTN (hypertension)    Iliac artery occlusion (HCC)    Bilateral   Lymphoma in remission ([place])    Myocardial infarction ([place]) 04/23/2021   NSTEMI   PAD (peripheral artery disease) [facility])     Patient Active Problem List   Diagnosis Date Noted   Acute on chronic systolic CHF (congestive heart failure) ([place]) 12/19/2021   Ischemic cardiomyopathy 12/19/2021   CAD, multiple vessel 12/19/2021   AKI (acute kidney injury) ([place]) 01/12/2020   Anemia 01/12/2020   Atherosclerosis  of native arteries of extremity with intermittent claudication ([place]) 11/15/2018   Carotid stenosis 09/18/2017   TIA (transient ischemic attack) 06/28/2017   Chronic low back pain with left-sided sciatica 10/24/2016   PVD (peripheral vascular disease) ([place]) 05/01/2016   Right carotid bruit 05/01/2016   Pedal edema 05/01/2016   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[place]) 01/12/2016   Chest pain 11/01/2015   Health maintenance examination 05/27/2015   Depression 05/27/2015   Colon cancer screening 05/27/2015   Decreased hearing of left ear 05/27/2015   Left leg swelling 05/27/2015   Neuropathic pain, leg 01/01/2015   Left leg pain 01/01/2015   Essential hypertension 12/23/2014   Heartburn 11/18/2014   Hypertension 11/18/2014   Urinary frequency 11/18/2014   Nicotine dependence 11/18/2014   History of lymphoma 08/17/2014     Physical Exam  Triage Vital Signs: ED Triage Vitals  Enc Vitals Group     BP --      Pulse Rate 12/19/21 0830 96     Resp 12/19/21 0830 (!) 32     Temp 12/19/21 0830 (!) 96.9 F (36.1 C)     Temp Source 12/19/21 0830 Oral     SpO2 12/19/21 0830 97 %     Weight 12/19/21 0825 110 lb (49.9 kg)     Height 12/19/21 0825 '5\' 3"'$  (1.6 m)  Head Circumference --      Peak Flow --      Pain Score 12/19/21 0825 6     Pain Loc --      Pain Edu? --      Excl. in [place]? --     Most recent vital signs: Vitals:   12/19/21 1315 12/19/21 1400  BP:  138/80  Pulse:  84  Resp: (!) 26 (!) 29  Temp:  98.2 F (36.8 C)  SpO2:  96%     General: Awake, no distress.  CV:  Good peripheral perfusion.  Pitting edema bilateral lower extremities with symmetric Resp:  Patient is mildly tachypneic at rest, crackles at the bases bilaterally Abd:  No distention.  Abdomen is soft nontender throughout Neuro:             Awake, Alert, Oriented x 3  Other:     ED Results / Procedures / Treatments  Labs (all labs ordered are listed, but only abnormal results are  displayed) Labs Reviewed  COMPREHENSIVE METABOLIC PANEL - Abnormal; Notable for the following components:      Result Value   CO2 21 (*)    Glucose, Bld 114 (*)    BUN 37 (*)    Creatinine, Ser 1.54 (*)    Albumin 3.4 (*)    Alkaline Phosphatase 182 (*)    GFR, Estimated 35 (*)    All other components within normal limits  URINALYSIS, ROUTINE W REFLEX MICROSCOPIC - Abnormal; Notable for the following components:   Color, Urine YELLOW (*)    APPearance HAZY (*)    Hgb urine dipstick MODERATE (*)    Protein, ur 100 (*)    Leukocytes,Ua TRACE (*)    All other components within normal limits  BRAIN NATRIURETIC PEPTIDE - Abnormal; Notable for the following components:   B Natriuretic Peptide >4,500.0 (*)    All other components within normal limits  CBC WITH DIFFERENTIAL/PLATELET - Abnormal; Notable for the following components:   RBC 3.46 (*)    Hemoglobin 10.4 (*)    HCT 31.7 (*)    RDW 17.2 (*)    All other components within normal limits  TROPONIN I (HIGH SENSITIVITY) - Abnormal; Notable for the following components:   Troponin I (High Sensitivity) 64 (*)    All other components within normal limits  TROPONIN I (HIGH SENSITIVITY) - Abnormal; Notable for the following components:   Troponin I (High Sensitivity) 71 (*)    All other components within normal limits  SARS CORONAVIRUS 2 BY RT PCR  LACTIC ACID, PLASMA  LACTIC ACID, PLASMA  PROCALCITONIN  CBC WITH DIFFERENTIAL/PLATELET     EKG  EKG reviewed and interpreted by myself shows sinus rhythm with normal axis normal intervals low voltage, poor R wave progression with septal Q waves   RADIOLOGY X-ray reviewed and interpreted by myself shows cardiomegaly and pulmonary edema   PROCEDURES:  Critical Care performed: Yes, see critical care procedure note(s)  Procedures  The patient is on the cardiac monitor to evaluate for evidence of arrhythmia and/or significant heart rate changes.   MEDICATIONS ORDERED IN  ED: Medications  traMADol (ULTRAM) tablet 50 mg (has no administration in time range)  atorvastatin (LIPITOR) tablet 80 mg (has no administration in time range)  metoprolol succinate (TOPROL-XL) 24 hr tablet 25 mg (25 mg Oral Given 12/19/21 1335)  nitroGLYCERIN (NITROSTAT) SL tablet 0.4 mg (has no administration in time range)  sacubitril-valsartan (ENTRESTO) 24-26 mg per tablet (has no administration  in time range)  nicotine (NICODERM CQ - dosed in mg/24 hours) patch 14 mg (has no administration in time range)  sertraline (ZOLOFT) tablet 50 mg (50 mg Oral Given 12/19/21 1335)  pantoprazole (PROTONIX) EC tablet 40 mg (40 mg Oral Given 12/19/21 1335)  mirabegron ER (MYRBETRIQ) tablet 25 mg (25 mg Oral Given 12/19/21 1335)  clopidogrel (PLAVIX) tablet 75 mg (75 mg Oral Given 12/19/21 1335)  enoxaparin (LOVENOX) injection 30 mg (has no administration in time range)  acetaminophen (TYLENOL) tablet 650 mg (has no administration in time range)    Or  acetaminophen (TYLENOL) suppository 650 mg (has no administration in time range)  furosemide (LASIX) injection 40 mg (has no administration in time range)  aspirin EC tablet 81 mg (has no administration in time range)  furosemide (LASIX) injection 40 mg (40 mg Intravenous Given 12/19/21 1051)  nitroGLYCERIN (NITROGLYN) 2 % ointment 1 inch (1 inch Topical Given 12/19/21 1049)  aspirin chewable tablet 324 mg (324 mg Oral Given 12/19/21 1048)     IMPRESSION / MDM / ASSESSMENT AND PLAN / ED COURSE  I reviewed the triage vital signs and the nursing notes.                              Patient's presentation is most consistent with acute presentation with potential threat to life or bodily function.  Differential diagnosis includes, but is not limited to, CHF exacerbation, acute coronary syndrome, pneumonia, pleural effusion  The patient is a 76 year old female who has a history of significant coronary disease and heart failure who presents today with shortness of  breath for about 1 week as well as intermittent chest pain.  Has had cough that is minimally productive but no other infectious symptoms.  Has had some nausea but no abdominal pain.  Her chest pain is dull constant nonexertional nonpleuritic.  Patient is hypertensive satting in the mid 90s on room air mildly tachypneic on arrival.  On my evaluation she looks comfortable and is not in respiratory distress she does look significantly volume overloaded with significant pitting edema in lower extremities crackles at the base of her lungs.  Chest x-ray looks like pulmonary edema.  Her EKG is somewhat nonspecific but is not acutely ischemic looking.  Labs show mildly elevated troponin in the 60s her BNP is greater than 4500, 1.5 which is similar to baseline last in care everywhere.  She has stable anemia hemoglobin around 10.  Suspect CHF exacerbation.  Patient is not currently taking her medications consistently at all.  Only taking the benazepril intermittently and omeprazole she is not taking diuretic beta-blocker, Plavix or the Entresto that she is prescribed.  We will give a dose of Vasotec nitro paste to help with afterload.  Will give of IV Lasix.  Aspirin given the mildly elevated troponin but patient having no active chest pain now.  Patient will require admission for ongoing diuresis.       FINAL CLINICAL IMPRESSION(S) / ED DIAGNOSES   Final diagnoses:  Acute on chronic congestive heart failure, unspecified heart failure type ([place])     Rx / DC Orders   ED Discharge Orders     None        Note:  This document was prepared using Dragon voice recognition software and may include unintentional dictation errors.   Rada Hay, MD 12/19/21 1540

## 2021-12-19 NOTE — Assessment & Plan Note (Addendum)
Patient presents for evaluation of worsening shortness of breath, orthopnea and bilateral lower extremity swelling BNP is elevated and chest x-ray shows findings suggestive of acute CHF --LVEF 25-30% with grade I DD --started on Lasix 40 mg IV twice daily --cardiology consulted Plan: --cont IV lasix 40 mg q12h --Continue metoprolol and Entresto

## 2021-12-19 NOTE — Assessment & Plan Note (Addendum)
Continue atorvastatin, aspirin, Plavix

## 2021-12-19 NOTE — ED Notes (Signed)
Pt refused covid swab. °

## 2021-12-19 NOTE — Assessment & Plan Note (Signed)
Smoking cessation was discussed with patient in detail Continue nicotine transdermal patch

## 2021-12-19 NOTE — Assessment & Plan Note (Signed)
Continue sertraline 

## 2021-12-19 NOTE — ED Triage Notes (Signed)
Pt here for right side chest pain intermittent over last week but constant last 1-2 days.  + SHOB even at rest.  Pt is smoker but denies COPD.  labored on arrival.  Swelling BLE, when asked pt reports not normal for her but does had decreased EF per chart, had recent echo per family but they are unsure of current EF; done at [facility]

## 2021-12-19 NOTE — Assessment & Plan Note (Addendum)
history of multivessel coronary artery disease status post PCI Her last known LVEF is 25% from a 2D echocardiogram which was done 01/23 Continue metoprolol, aspirin, Plavix, atorvastatin and Entresto

## 2021-12-19 NOTE — H&P (Signed)
History and Physical    Patient: Ashley Lynch:[id number] DOB: July 08, 1945 DOA: 12/19/2021 DOS: the patient was seen and examined on 12/19/2021 PCP: Perrin Maltese, MD  Patient coming from: Home  Chief Complaint:  Chief Complaint  Patient presents with   Chest Pain   Shortness of Breath   HPI: Ashley Lynch is a 76 y.o. female with medical history significant for coronary artery disease with severe multivessel disease, status post PCI with stent angioplasty, peripheral arterial disease, nicotine dependence, ischemic cardiomyopathy with last known LVEF of 25% from a 2D echocardiogram which was done in 01/23 who presents to the ER for evaluation of worsening shortness of breath over the last 1 week associated with bilateral lower extremity swelling, orthopnea and intermittent right-sided chest pain. She has a cough productive of occasional green phlegm but denies having any fever or chills.  She denies having any nausea, no vomiting, no changes in her bowel habits, no urinary symptoms, no headache, no dizziness, no lightheadedness, no blurred vision or focal deficit. Labs show a BNP level greater than 4500 Chest x-ray shows mild cardiomegaly with central pulmonary vascular congestion. Bilateral lung opacities are noted most consistent with pulmonary edema and associated bibasilar atelectasis and pleural effusions She received IV Lasix in the ER and will be admitted for further evaluation   Review of Systems: As mentioned in the history of present illness. All other systems reviewed and are negative. Past Medical History:  Diagnosis Date   Cancer ([place])    lymphoma   Cardiomyopathy ([place])    Complication of anesthesia    slow to wake   H/O heart artery stent 12/2019   X2   HFrEF (heart failure with reduced ejection fraction) (HCC)    History of chicken pox    History of measles, mumps, or rubella    HTN (hypertension)    Iliac artery occlusion (HCC)    Bilateral   Lymphoma in  remission ([place])    Myocardial infarction ([place]) 04/23/2021   NSTEMI   PAD (peripheral artery disease) [facility])    Past Surgical History:  Procedure Laterality Date   ABDOMINAL HYSTERECTOMY  04/18/2011   CARDIAC CATHETERIZATION  04/25/2021   CATARACT EXTRACTION W/PHACO Left 09/20/2021   Procedure: CATARACT EXTRACTION PHACO AND INTRAOCULAR LENS PLACEMENT (IOC) LEFT  18.07 01:25.3;  Surgeon: Birder Robson, MD;  Location: [place];  Service: Ophthalmology;  Laterality: Left;   CATARACT EXTRACTION W/PHACO Right 10/04/2021   Procedure: CATARACT EXTRACTION PHACO AND INTRAOCULAR LENS PLACEMENT (IOC) RIGHT  5.68 00:44.8;  Surgeon: Birder Robson, MD;  Location: [place];  Service: Ophthalmology;  Laterality: Right;   [place]   LEFT HEART CATH AND CORONARY ANGIOGRAPHY N/A 01/13/2020   Procedure: LEFT HEART CATH AND CORONARY ANGIOGRAPHY;  Surgeon: Dionisio David, MD;  Location: [facility];  Service: Cardiovascular;  Laterality: N/A;   TONSILLECTOMY  04/17/2008   Social History:  reports that she has been smoking cigarettes. She has a 60.00 pack-year smoking history. She has never used smokeless tobacco. She reports that she does not drink alcohol and does not use drugs.  No Known Allergies  Family History  Problem Relation Age of Onset   Diabetes Mother        sibling and grandparent also    Prior to Admission medications   Medication Sig Start Date End Date Taking? Authorizing Provider  benazepril (LOTENSIN) 20 MG tablet Take 20 mg by mouth daily. 01/08/20  Yes [provider]  GEMTESA 75 MG TABS Take 75 mg by mouth daily. 09/20/21  Yes [provider]  NICODERM CQ 14 MG/24HR patch Place 1 patch onto the skin daily. 08/24/21  Yes [provider]  omeprazole (PRILOSEC) 20 MG capsule Take 1 capsule (20 mg total) by mouth daily. 11/27/16  Yes Karamalegos, Devonne Doughty, DO  sertraline (ZOLOFT) 50 MG tablet Take 50 mg by  mouth daily. 06/05/20  Yes [provider]  amLODipine (NORVASC) 5 MG tablet Take 1 tablet each morning Patient not taking: Reported on 08/16/2021 06/19/16   Arlis Porta., MD  aspirin 81 MG tablet Take 81 mg by mouth daily. Patient not taking: Reported on 09/19/2021    [provider]  atorvastatin (LIPITOR) 80 MG tablet Take 80 mg by mouth. Patient not taking: Reported on 09/19/2021    [provider]  clopidogrel (PLAVIX) 75 MG tablet Take 75 mg by mouth daily. Patient not taking: Reported on 10/04/2021    [provider]  fluticasone Asencion Islam) 50 MCG/ACT nasal spray instill 2 sprays into each nostril once daily Patient not taking: Reported on 09/19/2021 01/11/16   Arlis Porta., MD  furosemide (LASIX) 20 MG tablet Take 20 mg by mouth daily. Patient not taking: Reported on 09/19/2021 12/11/19   [provider]  MAGNESIUM PO Take by mouth.    [provider]  metoprolol succinate (TOPROL-XL) 25 MG 24 hr tablet Take 25 mg by mouth daily. Patient not taking: Reported on 08/16/2021    [provider]  nitroGLYCERIN (NITROSTAT) 0.4 MG SL tablet Place 0.4 tablets under the tongue daily as needed. 08/24/21   [provider]  rosuvastatin (CRESTOR) 10 MG tablet Take 10 mg by mouth daily. Patient not taking: Reported on 08/16/2021 09/02/19   [provider]  sacubitril-valsartan (ENTRESTO) 24-26 MG Take 1 tablet by mouth 2 (two) times daily. Patient not taking: Reported on 08/16/2021    [provider]  sertraline (ZOLOFT) 25 MG tablet Take 25 mg by mouth at bedtime. Patient not taking: Reported on 12/19/2021    [provider]  ticagrelor (BRILINTA) 90 MG TABS tablet Take 90 mg by mouth. Patient not taking: Reported on 08/16/2021    [provider]  traMADol (ULTRAM) 50 MG tablet Take 50 mg by mouth 3 (three) times daily as needed. 12/25/19   [provider]  vitamin B-12 (CYANOCOBALAMIN) 1000  MCG tablet Take 1,000 mcg by mouth daily. Patient not taking: Reported on 08/16/2021    [provider]    Physical Exam: Vitals:   12/19/21 0830 12/19/21 0900 12/19/21 0930 12/19/21 1000  BP: (!) 153/91 (!) 172/76 (!) 165/108 (!) 165/86  Pulse: 96 90 85 90  Resp: (!) 32 16 (!) 116 17  Temp: (!) 96.9 F (36.1 C)     TempSrc: Oral     SpO2: 97% 93% 94%   Weight:      Height:       Physical Exam Vitals and nursing note reviewed.  Constitutional:      Comments: Chronically ill-appearing  HENT:     Head: Normocephalic and atraumatic.  Eyes:     Pupils: Pupils are equal, round, and reactive to light.  Cardiovascular:     Rate and Rhythm: Normal rate and regular rhythm.     Heart sounds: Normal heart sounds.  Pulmonary:     Effort: Tachypnea present.     Breath sounds: Examination of the right-lower field reveals rales. Examination  of the left-lower field reveals rales. Rales present.  Musculoskeletal:     Cervical back: Normal range of motion and neck supple.     Right lower leg: Edema present.     Left lower leg: Edema present.  Skin:    General: Skin is warm and dry.  Neurological:     General: No focal deficit present.     Mental Status: She is alert.  Psychiatric:        Mood and Affect: Mood normal.        Behavior: Behavior normal.     Data Reviewed: Data Reviewed: Relevant notes from primary care and specialist visits, past discharge summaries as available in EHR, including Care Everywhere. Prior diagnostic testing as pertinent to current admission diagnoses Updated medications and problem lists for reconciliation ED course, including vitals, labs, imaging, treatment and response to treatment Triage notes, nursing and pharmacy notes and ED provider's notes Notable results as noted in HPI Labs reviewed.  Troponin 64 >> 71, BNP greater than 4500, sodium 140, potassium 4.5, chloride 110, bicarb 21, glucose 114, BUN 37, creatinine 1.54, calcium 8.9, total  protein 7.4, albumin 3.4, AST 38, ALT 25, alkaline phosphatase 182, total bilirubin 1.2, white count 8.0, hemoglobin 10.4, hematocrit 39.7, platelet count 193, lactic acid 1.9 Chest x-ray reviewed by me shows Mild cardiomegaly with central pulmonary vascular congestion. Bilateral lung opacities are noted most consistent with pulmonary edema and associated bibasilar atelectasis and pleural effusions, although multifocal pneumonia cannot be excluded. Twelve-lead EKG reviewed by me shows sinus rhythm with anterior infarct There are no new results to review at this time.  Assessment and Plan: * Acute on chronic systolic CHF (congestive heart failure) ([place]) Patient presents for evaluation of worsening shortness of breath, orthopnea and bilateral lower extremity swelling BNP is elevated and chest x-ray shows findings suggestive of acute CHF Last known LVEF of 25% from a 2D echocardiogram which was done on 01/23 Place patient on Lasix 40 mg IV twice daily Continue metoprolol and Entresto Maintain low-sodium diet Check daily weights Consult cardiology  Ischemic cardiomyopathy Patient has a long history of multivessel coronary artery disease status post PCI Her last known LVEF is 25% from a 2D echocardiogram which was done 01/23 Continue metoprolol, aspirin, Plavix, atorvastatin and Entresto Repeat 2D echocardiogram to assess LVEF  CAD, multiple vessel Treatment as outlined in 2  PVD (peripheral vascular disease) ([place]) Continue atorvastatin, aspirin, Plavix and metoprolol  Depression Continue sertraline  Nicotine dependence Smoking cessation was discussed with patient in detail Continue nicotine transdermal patch  Hypertension Continue metoprolol and uptitrate as needed for blood pressure control Hold amlodipine due to worsening lower extremity swelling       Advance Care Planning:   Code Status: Full Code   Consults: Cardiology  Family Communication: Greater than 50% of time  was spent discussing patient's condition and plan of care with her and her daughters at the bedside.  All questions and concerns have been addressed.  They verbalized understanding and agree with the plan.  Severity of Illness: The appropriate patient status for this patient is INPATIENT. Inpatient status is judged to be reasonable and necessary in order to provide the required intensity of service to ensure the patient's safety. The patient's presenting symptoms, physical exam findings, and initial radiographic and laboratory data in the context of their chronic comorbidities is felt to place them at high risk for further clinical deterioration. Furthermore, it is not anticipated that the patient will be medically stable for  discharge from the hospital within 2 midnights of admission.   * I certify that at the point of admission it is my clinical judgment that the patient will require inpatient hospital care spanning beyond 2 midnights from the point of admission due to high intensity of service, high risk for further deterioration and high frequency of surveillance required.*  Author: Collier Bullock, MD 12/19/2021 12:54 PM  For on call review [email or web address].

## 2021-12-19 NOTE — Consult Note (Signed)
CARDIOLOGY CONSULT NOTE               Patient ID: Ashley Lynch MRN: [id number] DOB/AGE: [birth year] 76 y.o.  Admit date: 12/19/2021 Referring Physician Dr. Francine Graven hospitalist Primary Physician Dr. Lamonte Sakai Primary Cardiologist Dr. Ceasar Mons Reason for Consultation shortness of breath chest pain  HPI: Patient is a 76 year old female multiple medical problems multivessel coronary disease status post previous PCI and stent.  Patient with history of peripheral vascular disease smoking ischemic cardiomyopathy congestive heart failure generalized weakness.  Bilateral lung opacities consistent with pulmonary edema atelectasis effusion patient has been having shortness of breath of several days found to have a BNP of over 4000.  Most recent echocardiogram in January showed severely depressed left ventricular function breath.  Review of systems complete and found to be negative unless listed above     Past Medical History:  Diagnosis Date   Cancer ([place])    lymphoma   Cardiomyopathy ([place])    Complication of anesthesia    slow to wake   H/O heart artery stent 12/2019   X2   HFrEF (heart failure with reduced ejection fraction) (HCC)    History of chicken pox    History of measles, mumps, or rubella    HTN (hypertension)    Iliac artery occlusion (HCC)    Bilateral   Lymphoma in remission ([place])    Myocardial infarction ([place]) 04/23/2021   NSTEMI   PAD (peripheral artery disease) [facility])     Past Surgical History:  Procedure Laterality Date   ABDOMINAL HYSTERECTOMY  04/18/2011   CARDIAC CATHETERIZATION  04/25/2021   CATARACT EXTRACTION W/PHACO Left 09/20/2021   Procedure: CATARACT EXTRACTION PHACO AND INTRAOCULAR LENS PLACEMENT (IOC) LEFT  18.07 01:25.3;  Surgeon: Birder Robson, MD;  Location: [place];  Service: Ophthalmology;  Laterality: Left;   CATARACT EXTRACTION W/PHACO Right 10/04/2021   Procedure: CATARACT EXTRACTION PHACO AND INTRAOCULAR LENS PLACEMENT  (IOC) RIGHT  5.68 00:44.8;  Surgeon: Birder Robson, MD;  Location: [place];  Service: Ophthalmology;  Laterality: Right;   [place]   LEFT HEART CATH AND CORONARY ANGIOGRAPHY N/A 01/13/2020   Procedure: LEFT HEART CATH AND CORONARY ANGIOGRAPHY;  Surgeon: Dionisio David, MD;  Location: [facility];  Service: Cardiovascular;  Laterality: N/A;   TONSILLECTOMY  04/17/2008    Medications Prior to Admission  Medication Sig Dispense Refill Last Dose   benazepril (LOTENSIN) 20 MG tablet Take 20 mg by mouth daily.   12/18/2021   GEMTESA 75 MG TABS Take 75 mg by mouth daily.   12/18/2021   NICODERM CQ 14 MG/24HR patch Place 1 patch onto the skin daily.   12/19/2021   omeprazole (PRILOSEC) 20 MG capsule Take 1 capsule (20 mg total) by mouth daily. 90 capsule 3 12/18/2021   sertraline (ZOLOFT) 50 MG tablet Take 50 mg by mouth daily.   12/18/2021   amLODipine (NORVASC) 5 MG tablet Take 1 tablet each morning (Patient not taking: Reported on 08/16/2021) 30 tablet 12 Not Taking   aspirin 81 MG tablet Take 81 mg by mouth daily. (Patient not taking: Reported on 09/19/2021)   Not Taking   atorvastatin (LIPITOR) 80 MG tablet Take 80 mg by mouth. (Patient not taking: Reported on 09/19/2021)   Not Taking   clopidogrel (PLAVIX) 75 MG tablet Take 75 mg by mouth daily. (Patient not taking: Reported on 10/04/2021)   Not Taking   fluticasone (FLONASE) 50 MCG/ACT nasal spray instill 2  sprays into each nostril once daily (Patient not taking: Reported on 09/19/2021) 48 g 3 Not Taking   furosemide (LASIX) 20 MG tablet Take 20 mg by mouth daily. (Patient not taking: Reported on 09/19/2021)   Not Taking   MAGNESIUM PO Take by mouth. (Patient not taking: Reported on 12/19/2021)   Not Taking   metoprolol succinate (TOPROL-XL) 25 MG 24 hr tablet Take 25 mg by mouth daily. (Patient not taking: Reported on 08/16/2021)   Not Taking   nitroGLYCERIN (NITROSTAT) 0.4 MG SL tablet Place 0.4 tablets under the tongue  daily as needed.   PRN at PRN   rosuvastatin (CRESTOR) 10 MG tablet Take 10 mg by mouth daily. (Patient not taking: Reported on 08/16/2021)   Not Taking   sacubitril-valsartan (ENTRESTO) 24-26 MG Take 1 tablet by mouth 2 (two) times daily. (Patient not taking: Reported on 08/16/2021)   Not Taking   sertraline (ZOLOFT) 25 MG tablet Take 25 mg by mouth at bedtime. (Patient not taking: Reported on 12/19/2021)   Not Taking   ticagrelor (BRILINTA) 90 MG TABS tablet Take 90 mg by mouth. (Patient not taking: Reported on 08/16/2021)   Not Taking   traMADol (ULTRAM) 50 MG tablet Take 50 mg by mouth 3 (three) times daily as needed.   PRN at PRN   vitamin B-12 (CYANOCOBALAMIN) 1000 MCG tablet Take 1,000 mcg by mouth daily. (Patient not taking: Reported on 08/16/2021)   Not Taking   Social History   Socioeconomic History   Marital status: Widowed    Spouse name: Not on file   Number of children: Not on file   Years of education: Not on file   Highest education level: Not on file  Occupational History   Not on file  Tobacco Use   Smoking status: Every Day    Packs/day: 1.00    Years: 60.00    Total pack years: 60.00    Types: Cigarettes   Smokeless tobacco: Never  Vaping Use   Vaping Use: Never used  Substance and Sexual Activity   Alcohol use: No   Drug use: No   Sexual activity: Not on file  Other Topics Concern   Not on file  Social History Narrative   Not on file   Social Determinants of Health   Financial Resource Strain: Not on file  Food Insecurity: Not on file  Transportation Needs: Not on file  Physical Activity: Not on file  Stress: Not on file  Social Connections: Not on file  Intimate Partner Violence: Not on file    Family History  Problem Relation Age of Onset   Diabetes Mother        sibling and grandparent also      Review of systems complete and found to be negative unless listed above      PHYSICAL EXAM  General: Well developed, well nourished, in no acute  distress HEENT:  Normocephalic and atramatic Neck:  No JVD.  Lungs: Clear bilaterally to auscultation and percussion. Heart: HRRR . Normal S1 and S2 without gallops or murmurs.  Abdomen: Bowel sounds are positive, abdomen soft and non-tender  Msk:  Back normal, normal gait. Normal strength and tone for age. Extremities: No clubbing, cyanosis or edema.   Neuro: Alert and oriented X 3. Psych:  Good affect, responds appropriately  Labs:   Lab Results  Component Value Date   WBC 8.0 12/19/2021   HGB 10.4 (L) 12/19/2021   HCT 31.7 (L) 12/19/2021   MCV 91.6 12/19/2021  PLT 193 12/19/2021    Recent Labs  Lab 12/19/21 0842  NA 140  K 4.5  CL 110  CO2 21*  BUN 37*  CREATININE 1.54*  CALCIUM 8.9  PROT 7.4  BILITOT 1.2  ALKPHOS 182*  ALT 25  AST 38  GLUCOSE 114*   Lab Results  Component Value Date   TROPONINI <0.03 06/28/2017    Lab Results  Component Value Date   CHOL 130 01/13/2020   CHOL 180 06/29/2017   CHOL 190 06/19/2016   Lab Results  Component Value Date   HDL 43 01/13/2020   HDL 36 (L) 06/29/2017   HDL 35 (L) 06/19/2016   Lab Results  Component Value Date   LDLCALC 70 01/13/2020   LDLCALC 109 (H) 06/29/2017   LDLCALC 127 (H) 06/19/2016   Lab Results  Component Value Date   TRIG 83 01/13/2020   TRIG 176 (H) 06/29/2017   TRIG 141 06/19/2016   Lab Results  Component Value Date   CHOLHDL 3.0 01/13/2020   CHOLHDL 5.0 06/29/2017   CHOLHDL 5.4 (H) 06/19/2016   No results found for: "LDLDIRECT"    Radiology: DG Shoulder Right  Result Date: 12/19/2021 CLINICAL DATA:  Pain right shoulder EXAM: RIGHT SHOULDER - 2+ VIEW COMPARISON:  12/19/2021 FINDINGS: No fracture or dislocation is seen. Osteopenia is seen in bony structures. Bony spurs are seen in right AC joint. Surgical clips are seen in right side of neck. IMPRESSION: No recent fracture or dislocation is seen. Degenerative changes are noted with bony spurs in the right AC joint. Electronically Signed    By: Elmer Picker M.D.   On: 12/19/2021 14:36   DG Chest Portable 1 View  Result Date: 12/19/2021 CLINICAL DATA:  Chest pain, shortness of breath. EXAM: PORTABLE CHEST 1 VIEW COMPARISON:  January 12, 2020. FINDINGS: Mild cardiomegaly is noted with central pulmonary vascular congestion. Bilateral interstitial and airspace opacities are noted throughout both lungs most consistent with pulmonary edema and associated bibasilar atelectasis and pleural effusions, although multifocal pneumonia cannot be excluded. Bony thorax is unremarkable. IMPRESSION: Mild cardiomegaly with central pulmonary vascular congestion. Bilateral lung opacities are noted most consistent with pulmonary edema and associated bibasilar atelectasis and pleural effusions, although multifocal pneumonia cannot be excluded. Electronically Signed   By: Marijo Conception M.D.   On: 12/19/2021 09:11    EKG: Sinus rhythm rate of about 90 evidence of old anterior myocardial infarction with diffuse Q waves nonspecific ST-T wave changes elsewhere  ASSESSMENT AND PLAN:  Acute and Chronic CHF Ischemic cardiomyopathy Multivessel coronary disease Peripheral vascular disease Smoking Hypertension Lower extremity edema . Plan Recommend IV therapy for congestive heart failure with Lasix Supplemental oxygen as necessary for shortness of breath Continue Entresto metoprolol for heart failure Daily weights and reduce salt intake Maintain current medical therapy with aspirin Plavix metoprolol Lipitor Entresto Consider repeat echocardiogram for assessment of left ventricular function Advised patient to refrain from tobacco abuse Consider continue Lipitor therapy for lipid management Support stockings elevation for lower extremity edema Refer the patient back to [facility] cardiology tomorrow     Signed: Yolonda Kida MD 12/19/2021, 9:59 PM

## 2021-12-20 ENCOUNTER — Inpatient Hospital Stay
Admit: 2021-12-20 | Discharge: 2021-12-20 | Disposition: A | Discharge status: Home / Self Care | Payer: Medicare PPO | Attending: Internal Medicine | Admitting: Internal Medicine

## 2021-12-20 ENCOUNTER — Telehealth (HOSPITAL_COMMUNITY): Payer: Self-pay

## 2021-12-20 ENCOUNTER — Other Ambulatory Visit (HOSPITAL_COMMUNITY): Payer: Self-pay

## 2021-12-20 DIAGNOSIS — I5043 Acute on chronic combined systolic (congestive) and diastolic (congestive) heart failure: Secondary | ICD-10-CM

## 2021-12-20 DIAGNOSIS — L8990 Pressure ulcer of unspecified site, unspecified stage: Secondary | ICD-10-CM | POA: Insufficient documentation

## 2021-12-20 LAB — BASIC METABOLIC PANEL
Anion gap: 11 (ref 5–15)
BUN: 40 mg/dL — ABNORMAL HIGH (ref 8–23)
CO2: 21 mmol/L — ABNORMAL LOW (ref 22–32)
Calcium: 8.5 mg/dL — ABNORMAL LOW (ref 8.9–10.3)
Chloride: 107 mmol/L (ref 98–111)
Creatinine, Ser: 1.58 mg/dL — ABNORMAL HIGH (ref 0.44–1.00)
GFR, Estimated: 34 mL/min — ABNORMAL LOW (ref >60)
Glucose, Bld: 107 mg/dL — ABNORMAL HIGH (ref 70–99)
Potassium: 3.5 mmol/L (ref 3.5–5.1)
Sodium: 139 mmol/L (ref 135–145)

## 2021-12-20 LAB — ECHOCARDIOGRAM COMPLETE
AR max vel: 1.87 cm2
AV Area VTI: 2.32 cm2
AV Area mean vel: 1.96 cm2
AV Mean grad: 2 mmHg
AV Peak grad: 3.1 mmHg
Ao pk vel: 0.88 m/s
Area-P 1/2: 5.13 cm2
Calc EF: 27.4 %
Height: 63 in
S' Lateral: 4.6 cm
Single Plane A2C EF: 26.7 %
Single Plane A4C EF: 29.6 %
Weight: 1950.63 oz

## 2021-12-20 LAB — CBC
HCT: 31.2 % — ABNORMAL LOW (ref 36.0–46.0)
Hemoglobin: 10.2 g/dL — ABNORMAL LOW (ref 12.0–15.0)
MCH: 29 pg (ref 26.0–34.0)
MCHC: 32.7 g/dL (ref 30.0–36.0)
MCV: 88.6 fL (ref 80.0–100.0)
Platelets: 170 10*3/uL (ref 150–400)
RBC: 3.52 MIL/uL — ABNORMAL LOW (ref 3.87–5.11)
RDW: 16.6 % — ABNORMAL HIGH (ref 11.5–15.5)
WBC: 7.3 10*3/uL (ref 4.0–10.5)
nRBC: 0 % (ref 0.0–0.2)

## 2021-12-20 LAB — MAGNESIUM: Magnesium: 1.9 mg/dL (ref 1.7–2.4)

## 2021-12-20 MED ORDER — METHOCARBAMOL 1000 MG/10ML IJ SOLN
500.0000 mg | Freq: Once | INTRAVENOUS | Status: AC
  Administered 2021-12-20: 500 mg via INTRAVENOUS
  Filled 2021-12-20 (×2): qty 5

## 2021-12-20 MED ORDER — ADULT MULTIVITAMIN W/MINERALS CH
1.0000 | ORAL_TABLET | Freq: Every day | ORAL | Status: DC
  Administered 2021-12-20 – 2021-12-22 (×3): 1 via ORAL
  Filled 2021-12-20 (×3): qty 1

## 2021-12-20 MED ORDER — POTASSIUM CHLORIDE CRYS ER 20 MEQ PO TBCR
20.0000 meq | EXTENDED_RELEASE_TABLET | ORAL | Status: AC
  Administered 2021-12-20 (×2): 20 meq via ORAL
  Filled 2021-12-20 (×2): qty 1

## 2021-12-20 MED ORDER — ZINC SULFATE 220 (50 ZN) MG PO CAPS
220.0000 mg | ORAL_CAPSULE | Freq: Every day | ORAL | Status: DC
  Administered 2021-12-20 – 2021-12-22 (×3): 220 mg via ORAL
  Filled 2021-12-20 (×3): qty 1

## 2021-12-20 MED ORDER — VITAMIN C 500 MG PO TABS
500.0000 mg | ORAL_TABLET | Freq: Two times a day (BID) | ORAL | Status: DC
  Administered 2021-12-20 – 2021-12-22 (×4): 500 mg via ORAL
  Filled 2021-12-20 (×4): qty 1

## 2021-12-20 MED ORDER — ENSURE ENLIVE PO LIQD
237.0000 mL | Freq: Two times a day (BID) | ORAL | Status: DC
  Administered 2021-12-21 (×2): 237 mL via ORAL

## 2021-12-20 NOTE — Progress Notes (Signed)
*  PRELIMINARY RESULTS* Echocardiogram 2D Echocardiogram has been performed.  Ashley Lynch 12/20/2021, 9:31 AM

## 2021-12-20 NOTE — Progress Notes (Signed)
  PROGRESS NOTE    Ashley Lynch  CZY:[id number] DOB: 08-12-1945 DOA: 12/19/2021 PCP: Perrin Maltese, MD  [id number]  LOS: 1 day   Brief hospital course: No notes on file  Assessment & Plan: Ashley Lynch is a 76 y.o. female with medical history significant for coronary artery disease with severe multivessel disease, status post PCI with stent angioplasty, peripheral arterial disease, nicotine dependence, ischemic cardiomyopathy with last known LVEF of 25% who presented to the ER for evaluation of worsening shortness of breath over the last 1 week associated with bilateral lower extremity swelling, orthopnea and intermittent right-sided chest pain.   * Acute on chronic combined systolic and diastolic CHF (congestive heart failure) ([place]) Patient presents for evaluation of worsening shortness of breath, orthopnea and bilateral lower extremity swelling BNP is elevated and chest x-ray shows findings suggestive of acute CHF --LVEF 25-30% with grade I DD --started on Lasix 40 mg IV twice daily --cardiology consulted Plan: --cont IV lasix 40 mg q12h --Continue metoprolol and Entresto  Ischemic cardiomyopathy history of multivessel coronary artery disease status post PCI Her last known LVEF is 25% from a 2D echocardiogram which was done 01/23 Continue metoprolol, aspirin, Plavix, atorvastatin and Entresto  Pressure injury of skin Sacrum, stage 2, POA  PVD (peripheral vascular disease) ([place]) Continue atorvastatin, aspirin, Plavix  Depression Continue sertraline  Nicotine dependence Smoking cessation was discussed with patient in detail Continue nicotine transdermal patch  Hypertension --cont Toprol and Entresto --cont IV lasix Hold amlodipine due to worsening lower extremity swelling   DVT prophylaxis: Lovenox SQ Code Status: Full code  Family Communication: granddaughter updated at bedside today Level of care: Progressive Dispo:   The patient is from: home Anticipated d/c  is to: home Anticipated d/c date is: 1-2 days   Subjective and Interval History:  Pt reported being sleepy and tired.  Dyspnea improved, reported good urine output.   Objective: Vitals:   12/20/21 0500 12/20/21 0956 12/20/21 1123 12/20/21 1627  BP:  (!) 159/84 139/87 133/77  Pulse:  87 70 (!) 57  Resp:  '18 20 20  '$ Temp:  97.7 F (36.5 C)  97.8 F (36.6 C)  TempSrc:      SpO2:  92% 98% 98%  Weight: 55.3 kg     Height:        Intake/Output Summary (Last 24 hours) at 12/20/2021 1856 Last data filed at 12/20/2021 1457 Gross per 24 hour  Intake --  Output 2150 ml  Net -2150 ml   Filed Weights   12/19/21 0825 12/19/21 0827 12/20/21 0500  Weight: 49.9 kg 58.5 kg 55.3 kg    Examination:   Constitutional: NAD, lethargic, oriented HEENT: conjunctivae and lids normal, EOMI CV: No cyanosis.   RESP: normal respiratory effort, on RA Extremities: No effusions, edema in BLE SKIN: warm, dry Neuro: II - XII grossly intact.     Data Reviewed: I have personally reviewed labs and imaging studies  Time spent: 50 minutes  Enzo Bi, MD [facility] If 7PM-7AM, please contact night-coverage 12/20/2021, 6:56 PM

## 2021-12-20 NOTE — Progress Notes (Signed)
Initial Nutrition Assessment  DOCUMENTATION CODES:   Not applicable  INTERVENTION:   -Ensure Enlive po BID, each supplement provides 350 kcal and 20 grams of protein -MVI with minerals daily -500 mg vitamin C BID -220 mg zinc sulfate daily x 14 days -Provided "Low Sodium Nutrition Therapy" handout from [employer] [place]; attached to AVS/ discharge summary  NUTRITION DIAGNOSIS:   Increased nutrient needs related to wound healing as evidenced by estimated needs.  GOAL:   Patient will meet greater than or equal to 90% of their needs  MONITOR:   PO intake, Supplement acceptance  REASON FOR ASSESSMENT:   Rounds    ASSESSMENT:   Pt with medical history significant for coronary artery disease with severe multivessel disease, status post PCI with stent angioplasty, peripheral arterial disease, nicotine dependence, ischemic cardiomyopathy who presents for evaluation of worsening shortness of breath over the last 1 week associated with bilateral lower extremity swelling, orthopnea and intermittent right-sided chest pain.  Pt admitted with CHF.   Reviewed I/O's: -750 ml x 24 hours  UOP: 750 ml x 24 hours   Pt unavailable at time of visit. Attempted to speak with pt via call to hospital room phone, however, unable to reach. RD unable to obtain further nutrition-related history or complete nutrition-focused physical exam at this time.    Pt currently on a 2 gram sodium diet. No meal completion data available at this time.   Reviewed wt hx; pt wt has been stable over the past 3 months. Noted pt with edema, which may be masking true weight loss as well as fat and muscle depletions.   Pt with increased nutritional needs for wound healing and would benefit from addition of oral nutrition supplements.   Medications reviewed and include lasix and potassium chloride.   Labs reviewed: CBGS: 126.   Diet Order:   Diet Order             Diet 2 gram sodium Room service  appropriate? Yes; Fluid consistency: Thin  Diet effective now                   EDUCATION NEEDS:   No education needs have been identified at this time  Skin:  Skin Assessment: Skin Integrity Issues: Skin Integrity Issues:: Stage II Stage II: rt, lt sacrum  Last BM:  12/19/21  Height:   Ht Readings from Last 1 Encounters:  12/19/21 '5\' 3"'$  (1.6 m)    Weight:   Wt Readings from Last 1 Encounters:  12/20/21 55.3 kg    Ideal Body Weight:  52.3 kg  BMI:  Body mass index is 21.6 kg/m.  Estimated Nutritional Needs:   Kcal:  1700-1900  Protein:  85-100 grams  Fluid:  > 1.7 L    Loistine Chance, RD, LDN, [place] Registered Dietitian II Certified Diabetes Care and Education Specialist Please refer to [facility] for RD and/or RD on-call/weekend/after hours pager

## 2021-12-20 NOTE — TOC Benefit Eligibility Note (Signed)
Patient Teacher, English as a foreign language completed.    The patient is currently admitted and upon discharge could be taking Entresto 24-'26mg'$ .  The current 30 day co-pay is $40.00.   The patient is currently admitted and upon discharge could be taking Plavix '75mg'$ .  The current 30 day co-pay is $4.20.   The patient is insured through [place], [place] Patient Advocate Specialist [place] Patient Advocate Team Direct Number: [phone]  Fax: [phone]

## 2021-12-20 NOTE — Discharge Instructions (Signed)

## 2021-12-20 NOTE — Progress Notes (Signed)
SUBJECTIVE: From HPI: Ashley Lynch is a 76 y.o. female with medical history significant for coronary artery disease with severe multivessel disease, status post PCI with stent angioplasty, peripheral arterial disease, nicotine dependence, ischemic cardiomyopathy with last known LVEF of 25% from a 2D echocardiogram which was done in 01/23 who presents to the ER for evaluation of worsening shortness of breath over the last 1 week associated with bilateral lower extremity swelling, orthopnea and intermittent right-sided chest pain. She has a cough productive of occasional green phlegm but denies having any fever or chills.  She denies having any nausea, no vomiting, no changes in her bowel habits, no urinary symptoms, no headache, no dizziness, no lightheadedness, no blurred vision or focal deficit.   Vitals:   12/20/21 0013 12/20/21 0500 12/20/21 0956 12/20/21 1123  BP: (!) 164/101  (!) 159/84 139/87  Pulse: 63  87 70  Resp: '20  18 20  '$ Temp: 97.8 F (36.6 C)  97.7 F (36.5 C)   TempSrc: Oral     SpO2: 98%  92% 98%  Weight:  55.3 kg    Height:        Intake/Output Summary (Last 24 hours) at 12/20/2021 1132 Last data filed at 12/20/2021 0900 Gross per 24 hour  Intake --  Output 1050 ml  Net -1050 ml    LABS: Basic Metabolic Panel: Recent Labs    12/19/21 0842 12/20/21 0521  NA 140 139  K 4.5 3.5  CL 110 107  CO2 21* 21*  GLUCOSE 114* 107*  BUN 37* 40*  CREATININE 1.54* 1.58*  CALCIUM 8.9 8.5*   Liver Function Tests: Recent Labs    12/19/21 0842  AST 38  ALT 25  ALKPHOS 182*  BILITOT 1.2  PROT 7.4  ALBUMIN 3.4*   No results for input(s): "LIPASE", "AMYLASE" in the last 72 hours. CBC: Recent Labs    12/19/21 0905 12/20/21 0521  WBC 8.0 7.3  NEUTROABS 5.6  --   HGB 10.4* 10.2*  HCT 31.7* 31.2*  MCV 91.6 88.6  PLT 193 170   Cardiac Enzymes: No results for input(s): "CKTOTAL", "CKMB", "CKMBINDEX", "TROPONINI" in the last 72 hours. BNP: Invalid input(s):  "POCBNP" D-Dimer: No results for input(s): "DDIMER" in the last 72 hours. Hemoglobin A1C: No results for input(s): "HGBA1C" in the last 72 hours. Fasting Lipid Panel: No results for input(s): "CHOL", "HDL", "LDLCALC", "TRIG", "CHOLHDL", "LDLDIRECT" in the last 72 hours. Thyroid Function Tests: No results for input(s): "TSH", "T4TOTAL", "T3FREE", "THYROIDAB" in the last 72 hours.  Invalid input(s): "FREET3" Anemia Panel: No results for input(s): "VITAMINB12", "FOLATE", "FERRITIN", "TIBC", "IRON", "RETICCTPCT" in the last 72 hours.   PHYSICAL EXAM General: Well developed, well nourished, in no acute distress HEENT:  Normocephalic and atramatic Neck:  No JVD.  Lungs: Clear bilaterally to auscultation and percussion. Heart: HRRR . Normal S1 and S2 without gallops or murmurs.  Abdomen: Bowel sounds are positive, abdomen soft and non-tender  Msk:  Back normal, normal gait. Normal strength and tone for age. Extremities: No clubbing, cyanosis or edema.   Neuro: Alert and oriented X 3. Psych:  Good affect, responds appropriately  TELEMETRY: NSR, HR 64 bpm  ASSESSMENT AND PLAN: Patient laying comfortably in bed. Denies current chest pain. Shortness of breath stable. Echo results pending. Patient diuresing well on IV Lasix. Will continue IV Lasix. Will continue to follow.   Principal Problem:   Acute on chronic systolic CHF (congestive heart failure) (HCC) Active Problems:   Hypertension  Nicotine dependence   Depression   PVD (peripheral vascular disease) (HCC)   Ischemic cardiomyopathy   CAD, multiple vessel     , FNP-C 12/20/2021 11:32 AM

## 2021-12-20 NOTE — Telephone Encounter (Signed)
Pharmacy Patient Advocate Encounter  Insurance verification completed.    The patient is insured through [facility]   The patient is currently admitted and ran test claims for the following: Entresto, Plavix.  Copays and coinsurance results were relayed to Inpatient clinical team.

## 2021-12-20 NOTE — Assessment & Plan Note (Signed)
Sacrum, stage 2, POA

## 2021-12-20 NOTE — TOC Initial Note (Signed)
Transition of Care [facility]) - Initial/Assessment Note    Patient Details  Name: Ashley Lynch MRN: [id number] Date of Birth: 1945-12-23  Transition of Care [facility]) CM/SW Contact:    Ninfa Meeker, RN Phone Number: 12/20/2021, 12:16 PM  Clinical Narrative:                 Transition of Care Screening Note:  Transition of Care Department [facility]) has reviewed patient and no TOC needs have been identified at this time. We will continue to monitor patient advancement through Interdisciplinary progressions. If new patient transition needs arise, please place a consult.          Patient Goals and CMS Choice        Expected Discharge Plan and Services                                                Prior Living Arrangements/Services                       Activities of Daily Living Home Assistive Devices/Equipment: Cane (specify quad or straight) ADL Screening (condition at time of admission) Patient's cognitive ability adequate to safely complete daily activities?: Yes Is the patient deaf or have difficulty hearing?: No Does the patient have difficulty seeing, even when wearing glasses/contacts?: No Does the patient have difficulty concentrating, remembering, or making decisions?: Yes Patient able to express need for assistance with ADLs?: No Does the patient have difficulty dressing or bathing?: Yes Independently performs ADLs?: Yes (appropriate for developmental age) Does the patient have difficulty walking or climbing stairs?: Yes Weakness of Legs: Both Weakness of Arms/Hands: None  Permission Sought/Granted                  Emotional Assessment              Admission diagnosis:  Acute on chronic systolic CHF (congestive heart failure) ([place]) [I50.23] Acute on chronic congestive heart failure, unspecified heart failure type [facility]) [I50.9] Patient Active Problem List   Diagnosis Date Noted   Acute on chronic systolic CHF (congestive heart  failure) ([place]) 12/19/2021   Ischemic cardiomyopathy 12/19/2021   CAD, multiple vessel 12/19/2021   AKI (acute kidney injury) ([place]) 01/12/2020   Anemia 01/12/2020   Atherosclerosis of native arteries of extremity with intermittent claudication ([place]) 11/15/2018   Carotid stenosis 09/18/2017   TIA (transient ischemic attack) 06/28/2017   Chronic low back pain with left-sided sciatica 10/24/2016   PVD (peripheral vascular disease) ([place]) 05/01/2016   Right carotid bruit 05/01/2016   Pedal edema 05/01/2016   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: [phone]: [phone] [facility]  [place] [address] Phone: [phone] Fax: [phone]  [facility] [place], [address] [place] [address] Phone: [phone] Fax: [phone]     Social Determinants of Health (SDOH) Interventions    Readmission Risk Interventions     No data to display

## 2021-12-20 NOTE — Progress Notes (Signed)
Went into patient's room to review heart failure education and inform patient of follow up heart failure clinic appointment scheduled for 01/04/22 at 9:30 AM. Patient was sleeping and adult granddaughter, and primary contact, was at the bedside. Heart failure booklet given to pt's granddaughter and follow up appointment card given. Patient's granddaughter acknowledged and verbalized that they will call the clinic for any CHF related-symptoms or concerns that arise before appointment on the 20th.  Georg Ruddle, RN

## 2021-12-21 DIAGNOSIS — I5043 Acute on chronic combined systolic (congestive) and diastolic (congestive) heart failure: Secondary | ICD-10-CM | POA: Diagnosis not present

## 2021-12-21 LAB — BASIC METABOLIC PANEL
Anion gap: 10 (ref 5–15)
BUN: 42 mg/dL — ABNORMAL HIGH (ref 8–23)
CO2: 24 mmol/L (ref 22–32)
Calcium: 8.2 mg/dL — ABNORMAL LOW (ref 8.9–10.3)
Chloride: 105 mmol/L (ref 98–111)
Creatinine, Ser: 1.64 mg/dL — ABNORMAL HIGH (ref 0.44–1.00)
GFR, Estimated: 32 mL/min — ABNORMAL LOW (ref >60)
Glucose, Bld: 105 mg/dL — ABNORMAL HIGH (ref 70–99)
Potassium: 3.7 mmol/L (ref 3.5–5.1)
Sodium: 139 mmol/L (ref 135–145)

## 2021-12-21 LAB — CBC
HCT: 32.6 % — ABNORMAL LOW (ref 36.0–46.0)
Hemoglobin: 10.7 g/dL — ABNORMAL LOW (ref 12.0–15.0)
MCH: 28.9 pg (ref 26.0–34.0)
MCHC: 32.8 g/dL (ref 30.0–36.0)
MCV: 88.1 fL (ref 80.0–100.0)
Platelets: 193 10*3/uL (ref 150–400)
RBC: 3.7 MIL/uL — ABNORMAL LOW (ref 3.87–5.11)
RDW: 16.1 % — ABNORMAL HIGH (ref 11.5–15.5)
WBC: 7.6 10*3/uL (ref 4.0–10.5)
nRBC: 0 % (ref 0.0–0.2)

## 2021-12-21 LAB — PHOSPHORUS: Phosphorus: 3.5 mg/dL (ref 2.5–4.6)

## 2021-12-21 LAB — MAGNESIUM: Magnesium: 2 mg/dL (ref 1.7–2.4)

## 2021-12-21 MED ORDER — SENNOSIDES-DOCUSATE SODIUM 8.6-50 MG PO TABS
1.0000 | ORAL_TABLET | Freq: Two times a day (BID) | ORAL | Status: DC
  Administered 2021-12-21: 1 via ORAL
  Filled 2021-12-21 (×2): qty 1

## 2021-12-21 MED ORDER — FUROSEMIDE 10 MG/ML IJ SOLN: 40.0000 mg | Freq: Every day | INTRAMUSCULAR | Status: DC

## 2021-12-21 MED ORDER — MAGNESIUM OXIDE -MG SUPPLEMENT 400 (240 MG) MG PO TABS
800.0000 mg | ORAL_TABLET | Freq: Once | ORAL | Status: AC
  Administered 2021-12-21: 800 mg via ORAL
  Filled 2021-12-21: qty 2

## 2021-12-21 MED ORDER — POTASSIUM CHLORIDE CRYS ER 20 MEQ PO TBCR
20.0000 meq | EXTENDED_RELEASE_TABLET | ORAL | Status: AC
  Administered 2021-12-21 (×2): 20 meq via ORAL
  Filled 2021-12-21 (×2): qty 1

## 2021-12-21 NOTE — Consult Note (Signed)
PHARMACY CONSULT NOTE - FOLLOW UP  Pharmacy Consult for Electrolyte Monitoring and Replacement   Recent Labs: Potassium (mmol/L)  Date Value  12/21/2021 3.7  12/19/2013 3.9   Magnesium (mg/dL)  Date Value  12/21/2021 2.0   Calcium (mg/dL)  Date Value  12/21/2021 8.2 (L)   Calcium, Total (mg/dL)  Date Value  12/19/2013 9.2   Albumin (g/dL)  Date Value  12/19/2021 3.4 (L)  06/23/2015 4.2  12/19/2013 3.6   Phosphorus (mg/dL)  Date Value  12/21/2021 3.5   Sodium (mmol/L)  Date Value  12/21/2021 139  06/23/2015 136  12/19/2013 139    Assessment: Pt is a 76 yo F here with AoCHF. Pt has HFrEF (LVEF 25-30%) and h/o multivessel CAD s/p PCI with stenting. Regarding HFrEF GDMT, pt currently on Entresto 50 mg BID and Toprol XL 25 daily. Currently, potassium is below goal and while magnesium is technically at goal, pt is receiving scheduled furosemide 40 mg IV q12H, so it is likely the magnesium will drop.  Corrected calcium is 8.7  Goal of Therapy:  K >/= 4.0 and Mg >/= 2.0  Plan:  Potassium chloride 20 mEq PO q2H x 2 doses Magnesium Oxide 800 mg PO x 1 dose  Dara Hoyer, PharmD PGY-1 Pharmacy Resident 12/21/2021 11:42 AM

## 2021-12-21 NOTE — Progress Notes (Signed)
[facility] [place] NAME: Ashley Lynch    MR#:  [id number]  DATE OF BIRTH:  1945-05-19  SUBJECTIVE:  granddaughter at bedside. Patient complains of cramping in her feet. Denies any chest pain. Able to lay down and sleep. Diuresis in well. Does not want to work with PT. She is agreeable to sit out in the chair.   VITALS:  Blood pressure 116/65, pulse 63, temperature 98 F (36.7 C), resp. rate 16, height '5\' 3"'$  (1.6 m), weight 53.8 kg, SpO2 97 %.  PHYSICAL EXAMINATION:   GENERAL:  76 y.o.-year-old patient lying in the bed with no acute distress.  LUNGS: decreased breath sounds bilaterally, no wheezing, rales, rhonchi.  CARDIOVASCULAR: S1, S2 normal. No murmurs, rubs, or gallops.  ABDOMEN: Soft, nontender, nondistended. Bowel sounds present.  EXTREMITIES: No  edema b/l.    NEUROLOGIC: nonfocal  patient is alert and awake SKIN: No obvious rash, lesion, or ulcer.   LABORATORY PANEL:  CBC Recent Labs  Lab 12/21/21 0613  WBC 7.6  HGB 10.7*  HCT 32.6*  PLT 193    Chemistries  Recent Labs  Lab 12/19/21 0842 12/20/21 0521 12/21/21 0613  NA 140   < > 139  K 4.5   < > 3.7  CL 110   < > 105  CO2 21*   < > 24  GLUCOSE 114*   < > 105*  BUN 37*   < > 42*  CREATININE 1.54*   < > 1.64*  CALCIUM 8.9   < > 8.2*  MG  --    < > 2.0  AST 38  --   --   ALT 25  --   --   ALKPHOS 182*  --   --   BILITOT 1.2  --   --    < > = values in this interval not displayed.   Cardiac Enzymes No results for input(s): "TROPONINI" in the last 168 hours. RADIOLOGY:  ECHOCARDIOGRAM COMPLETE  Result Date: 12/20/2021    ECHOCARDIOGRAM REPORT   Patient Name:   Ashley Lynch Date of Exam: 12/20/2021 Medical Rec #:  [id number]     Height:       63.0 in Accession #:    [id number]    Weight:       121.9 lb Date of Birth:  [birth year]     BSA:          1.567 m Patient Age:    71 years      BP:           164/101 mmHg Patient Gender: F             HR:           63  bpm. Exam Location:  [facility] Procedure: 2D Echo, Cardiac Doppler and Color Doppler Indications:     CHF-acute systolic M84.13  History:         Patient has prior history of Echocardiogram examinations, most                  recent 01/13/2020. Cardiomyopathy, Previous Myocardial                  Infarction; Risk Factors:Hypertension. Heart failure with                  reduced ejection fraction.  Sonographer:     Sherrie Sport Referring Phys:  [place] Diagnosing Phys: Ashley Royals MD  Sonographer Comments: Suboptimal apical window. IMPRESSIONS  1. Left ventricular ejection fraction, by estimation, is 25 to 30%. The left ventricle has severely decreased function. The left ventricle demonstrates global hypokinesis. The left ventricular internal cavity size was mildly dilated. Left ventricular diastolic parameters are consistent with Grade I diastolic dysfunction (impaired relaxation).  2. Right ventricular systolic function is normal. The right ventricular size is normal.  3. Left atrial size was mild to moderately dilated.  4. Right atrial size was mild to moderately dilated.  5. Large pleural effusion in the left lateral region.  6. The mitral valve is normal in structure. Moderate mitral valve regurgitation.  7. Tricuspid valve regurgitation is moderate.  8. The aortic valve is normal in structure. Aortic valve regurgitation is trivial. FINDINGS  Left Ventricle: Left ventricular ejection fraction, by estimation, is 25 to 30%. The left ventricle has severely decreased function. The left ventricle demonstrates global hypokinesis. The left ventricular internal cavity size was mildly dilated. There is borderline left ventricular hypertrophy. Left ventricular diastolic parameters are consistent with Grade I diastolic dysfunction (impaired relaxation). Right Ventricle: The right ventricular size is normal. No increase in right ventricular wall thickness. Right ventricular systolic function is normal. Left  Atrium: Left atrial size was mild to moderately dilated. Right Atrium: Right atrial size was mild to moderately dilated. Pericardium: There is no evidence of pericardial effusion. Mitral Valve: The mitral valve is normal in structure. Moderate mitral valve regurgitation. Tricuspid Valve: The tricuspid valve is normal in structure. Tricuspid valve regurgitation is moderate. Aortic Valve: The aortic valve is normal in structure. Aortic valve regurgitation is trivial. Aortic valve mean gradient measures 2.0 mmHg. Aortic valve peak gradient measures 3.1 mmHg. Aortic valve area, by VTI measures 2.32 cm. Pulmonic Valve: The pulmonic valve was normal in structure. Pulmonic valve regurgitation is trivial. Aorta: The aortic root and ascending aorta are structurally normal, with no evidence of dilitation. IAS/Shunts: No atrial level shunt detected by color flow Doppler. Additional Comments: There is a large pleural effusion in the left lateral region.  LEFT VENTRICLE PLAX 2D LVIDd:         5.10 cm      Diastology LVIDs:         4.60 cm      LV e' lateral:   5.98 cm/s LV PW:         1.10 cm      LV E/e' lateral: 15.2 LV IVS:        1.30 cm LVOT diam:     2.00 cm LV SV:         30 LV SV Index:   19 LVOT Area:     3.14 cm  LV Volumes (MOD) LV vol d, MOD A2C: 119.0 ml LV vol d, MOD A4C: 93.7 ml LV vol s, MOD A2C: 87.2 ml LV vol s, MOD A4C: 66.0 ml LV SV MOD A2C:     31.8 ml LV SV MOD A4C:     93.7 ml LV SV MOD BP:      29.5 ml RIGHT VENTRICLE RV Basal diam:  3.60 cm RV S prime:     7.29 cm/s TAPSE (M-mode): 1.2 cm LEFT ATRIUM             Index        RIGHT ATRIUM           Index LA diam:        4.50 cm 2.87 cm/m   RA Area:  17.30 cm LA Vol (A2C):   90.1 ml 57.51 ml/m  RA Volume:   49.50 ml  31.59 ml/m LA Vol (A4C):   61.4 ml 39.19 ml/m LA Biplane Vol: 75.5 ml 48.19 ml/m  AORTIC VALVE AV Area (Vmax):    1.87 cm AV Area (Vmean):   1.96 cm AV Area (VTI):     2.32 cm AV Vmax:           87.50 cm/s AV Vmean:           60.400 cm/s AV VTI:            0.131 m AV Peak Grad:      3.1 mmHg AV Mean Grad:      2.0 mmHg LVOT Vmax:         52.20 cm/s LVOT Vmean:        37.700 cm/s LVOT VTI:          0.097 m LVOT/AV VTI ratio: 0.74  AORTA Ao Root diam: 2.60 cm MITRAL VALVE               TRICUSPID VALVE MV Area (PHT): 5.13 cm    TR Peak grad:   46.8 mmHg MV Decel Time: 148 msec    TR Vmax:        342.00 cm/s MV E velocity: 90.80 cm/s MV A velocity: 37.70 cm/s  SHUNTS MV E/A ratio:  2.41        Systemic VTI:  0.10 m                            Systemic Diam: 2.00 cm Ashley Royals MD Electronically signed by Ashley Royals MD Signature Date/Time: 12/20/2021/12:25:17 PM    Final     Assessment and Plan  JUANISHA BAUTCH is a 76 y.o. female with medical history significant for coronary artery disease with severe multivessel disease, status post PCI with stent angioplasty, peripheral arterial disease, nicotine dependence, ischemic cardiomyopathy with last known LVEF of 25% who presented to the ER for evaluation of worsening shortness of breath over the last 1 week associated with bilateral lower extremity swelling, orthopnea and intermittent right-sided chest pain.   Acute on chronic combined systolic and diastolic CHF (congestive heart failure) ([place]) Patient presents for evaluation of worsening shortness of breath, orthopnea and bilateral lower extremity swelling BNP is elevated and chest x-ray shows findings suggestive of acute CHF --LVEF 25-30% with grade I DD --started on Lasix 40 mg IV twice daily--chang e to oral tomorrow --cardiology consulted--Dr Humphrey Rolls --Continue metoprolol and Entresto --pt on RA   Ischemic cardiomyopathy history of multivessel coronary artery disease status post PCI Her last known LVEF is 25% from a 2D echocardiogram which was done 01/23 Continue metoprolol, aspirin, Plavix, atorvastatin and Entresto   Pressure injury of skin Sacrum, stage 2, POA   PVD (peripheral vascular disease) ([place]) Continue  atorvastatin, aspirin, Plavix   Depression Continue sertraline   Nicotine dependence Smoking cessation was discussed with patient in detail Continue nicotine transdermal patch   Hypertension --cont Toprol and Entresto --cont IV lasix Hold amlodipine due to worsening lower extremity swelling     DVT prophylaxis: Lovenox SQ Code Status: Full code  Family Communication: granddaughter updated at bedside today  Patient is not want to work with physical therapy. Granddaughter aware. If continues to show improvement will discharged tomorrow.     TOTAL TIME TAKING CARE OF THIS PATIENT: 35 minutes.  >50% time spent  on counselling and coordination of care  Note: This dictation was prepared with Dragon dictation along with smaller phrase technology. Any transcriptional errors that result from this process are unintentional.  Fritzi Mandes M.D    [facility]   CC: Primary care physician; Perrin Maltese, MD

## 2021-12-21 NOTE — Progress Notes (Signed)
SUBJECTIVE: Ashley Lynch is a 76 y.o. 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   Vitals:   12/20/21 2333 12/21/21 0425 12/21/21 0500 12/21/21 0713  BP: 139/79 130/74  137/71  Pulse: 64 61  (!) 58  Resp: '20 20  18  '$ Temp: 98.2 F (36.8 C) 97.8 F (36.6 C)  97.9 F (36.6 C)  TempSrc:    Oral  SpO2: 97% 94%  96%  Weight:   53.8 kg   Height:        Intake/Output Summary (Last 24 hours) at 12/21/2021 0935 Last data filed at 12/20/2021 2100 Gross per 24 hour  Intake --  Output 1600 ml  Net -1600 ml    LABS: Basic Metabolic Panel: Recent Labs    12/20/21 0521 12/21/21 0613  NA 139 139  K 3.5 3.7  CL 107 105  CO2 21* 24  GLUCOSE 107* 105*  BUN 40* 42*  CREATININE 1.58* 1.64*  CALCIUM 8.5* 8.2*  MG 1.9 2.0  PHOS  --  3.5   Liver Function Tests: Recent Labs    12/19/21 0842  AST 38  ALT 25  ALKPHOS 182*  BILITOT 1.2  PROT 7.4  ALBUMIN 3.4*   No results for input(s): "LIPASE", "AMYLASE" in the last 72 hours. CBC: Recent Labs    12/19/21 0905 12/20/21 0521 12/21/21 0613  WBC 8.0 7.3 7.6  NEUTROABS 5.6  --   --   HGB 10.4* 10.2* 10.7*  HCT 31.7* 31.2* 32.6*  MCV 91.6 88.6 88.1  PLT 193 170 193   Cardiac Enzymes: No results for input(s): "CKTOTAL", "CKMB", "CKMBINDEX", "TROPONINI" in the last 72 hours. BNP: Invalid input(s): "POCBNP" D-Dimer: No results for input(s): "DDIMER" in the last 72 hours. Hemoglobin A1C: No results for input(s): "HGBA1C" in the last 72 hours. Fasting Lipid Panel: No results for  input(s): "CHOL", "HDL", "LDLCALC", "TRIG", "CHOLHDL", "LDLDIRECT" in the last 72 hours. Thyroid Function Tests: No results for input(s): "TSH", "T4TOTAL", "T3FREE", "THYROIDAB" in the last 72 hours.  Invalid input(s): "FREET3" Anemia Panel: No results for input(s): "VITAMINB12", "FOLATE", "FERRITIN", "TIBC", "IRON", "RETICCTPCT" in the last 72 hours.   PHYSICAL EXAM General: Well developed, well nourished, in no acute distress HEENT:  Normocephalic and atramatic Neck:  No JVD.  Lungs: Clear bilaterally to auscultation and percussion. Heart: HRRR . Normal S1 and S2 without gallops or murmurs.  Abdomen: Bowel sounds are positive, abdomen soft and non-tender  Msk:  Back normal, normal gait. Normal strength and tone for age. Extremities: No clubbing, cyanosis or edema.   Neuro: Alert and oriented X 3. Psych:  Good affect, responds appropriately  TELEMETRY: sinus bradycardia, HR 55 bpm  ASSESSMENT AND PLAN: Patient resting comfortably in bed. Denies chest pain. Shortness of breath and lower extremity edema improved. Echo performed 12/20/21 revealed EF continues to be 25-30%. Continue Entresto, metoprolol.   Principal Problem:   Acute on chronic combined systolic and diastolic CHF (congestive heart failure) ([facility]) Active Problems:   Hypertension   Nicotine dependence   Depression   PVD (peripheral vascular disease) ([facility])   Ischemic cardiomyopathy   Pressure injury of  skin     ,FNP-C 12/21/2021 9:35 AM

## 2021-12-22 DIAGNOSIS — I10 Essential (primary) hypertension: Secondary | ICD-10-CM

## 2021-12-22 DIAGNOSIS — F32A Depression, unspecified: Secondary | ICD-10-CM

## 2021-12-22 DIAGNOSIS — I255 Ischemic cardiomyopathy: Secondary | ICD-10-CM | POA: Diagnosis not present

## 2021-12-22 DIAGNOSIS — I5043 Acute on chronic combined systolic (congestive) and diastolic (congestive) heart failure: Secondary | ICD-10-CM | POA: Diagnosis not present

## 2021-12-22 LAB — BASIC METABOLIC PANEL
Anion gap: 7 (ref 5–15)
BUN: 47 mg/dL — ABNORMAL HIGH (ref 8–23)
CO2: 24 mmol/L (ref 22–32)
Calcium: 8.2 mg/dL — ABNORMAL LOW (ref 8.9–10.3)
Chloride: 105 mmol/L (ref 98–111)
Creatinine, Ser: 1.84 mg/dL — ABNORMAL HIGH (ref 0.44–1.00)
GFR, Estimated: 28 mL/min — ABNORMAL LOW (ref >60)
Glucose, Bld: 100 mg/dL — ABNORMAL HIGH (ref 70–99)
Potassium: 4.1 mmol/L (ref 3.5–5.1)
Sodium: 136 mmol/L (ref 135–145)

## 2021-12-22 LAB — MAGNESIUM: Magnesium: 2.3 mg/dL (ref 1.7–2.4)

## 2021-12-22 LAB — CBC
HCT: 33.1 % — ABNORMAL LOW (ref 36.0–46.0)
Hemoglobin: 10.8 g/dL — ABNORMAL LOW (ref 12.0–15.0)
MCH: 29 pg (ref 26.0–34.0)
MCHC: 32.6 g/dL (ref 30.0–36.0)
MCV: 89 fL (ref 80.0–100.0)
Platelets: 208 10*3/uL (ref 150–400)
RBC: 3.72 MIL/uL — ABNORMAL LOW (ref 3.87–5.11)
RDW: 16.2 % — ABNORMAL HIGH (ref 11.5–15.5)
WBC: 9.1 10*3/uL (ref 4.0–10.5)
nRBC: 0 % (ref 0.0–0.2)

## 2021-12-22 LAB — PHOSPHORUS: Phosphorus: 3.1 mg/dL (ref 2.5–4.6)

## 2021-12-22 MED ORDER — CLOPIDOGREL BISULFATE 75 MG PO TABS: 75.0000 mg | ORAL_TABLET | Freq: Every day | ORAL | 1 refills | Status: DC

## 2021-12-22 MED ORDER — ADULT MULTIVITAMIN W/MINERALS CH: 1.0000 | ORAL_TABLET | Freq: Every day | ORAL | 1 refills | Status: DC

## 2021-12-22 MED ORDER — METOPROLOL SUCCINATE ER 25 MG PO TB24: 25.0000 mg | ORAL_TABLET | Freq: Every day | ORAL | 1 refills | Status: DC

## 2021-12-22 MED ORDER — SACUBITRIL-VALSARTAN 24-26 MG PO TABS: 1.0000 | ORAL_TABLET | Freq: Two times a day (BID) | ORAL | 1 refills | Status: DC

## 2021-12-22 MED ORDER — FUROSEMIDE 20 MG PO TABS: 20.0000 mg | ORAL_TABLET | Freq: Every day | ORAL | 1 refills | Status: DC

## 2021-12-22 MED ORDER — FUROSEMIDE 40 MG PO TABS
40.0000 mg | ORAL_TABLET | Freq: Every day | ORAL | Status: DC
  Administered 2021-12-22: 40 mg via ORAL
  Filled 2021-12-22: qty 1

## 2021-12-22 NOTE — Evaluation (Signed)
Occupational Therapy Evaluation Patient Details Name: Ashley Lynch MRN: [id number] DOB: [birth year] Today's Date: 12/22/2021   History of Present Illness 76 y.o. female with medical history significant for CAD with severe multivessel disease, s/p PCI with stent angioplasty, PAD, nicotine dependence, ischemic cardiomyopathy with last known LVEF of 25% who presented to the ER for evaluation of worsening shortness of breath over the last 1 week associated with bilateral lower extremity swelling, orthopnea and intermittent right-sided chest pain.   Clinical Impression   Pt was seen for OT evaluation this date. Prior to hospital admission, pt was living with her granddaughter in a small mobile home with ramped entrance. Pt required assist for tub shower transfers and IADL tasks. She denies falls in past 8mo Pt presents to acute OT demonstrating impaired ADL performance and functional mobility 2/2 decreased strength, balance, and activity tolerance (See OT problem list). Pt currently requires increased time/effort for bed mobility, CGA for ADL transfers with RW, CGA for brief ADL mobility in the room with RW and for standing grooming tasks at the sink. Fair standing balance with UE support on counter or RW, otherwise poor balance noted. Pt endorses fatigue and weakness with limited exertion during session. Pt educated in role of OT, benefits and mgt of RW for safety at home, falls prevention. Pt verbalized understanding. Pt would benefit from skilled OT services to address noted impairments and functional limitations (see below for any additional details) in order to maximize safety and independence while minimizing falls risk and caregiver burden. Upon hospital discharge, recommend HHOT to maximize pt safety and return to functional independence during meaningful occupations of daily life. Pt declining HH services at this time. Care team aware.       Recommendations for follow up therapy are one component of  a multi-disciplinary discharge planning process, led by the attending physician.  Recommendations may be updated based on patient status, additional functional criteria and insurance authorization.   Follow Up Recommendations  Home health OT    Assistance Recommended at Discharge Frequent or constant Supervision/Assistance  Patient can return home with the following A little help with walking and/or transfers;A little help with bathing/dressing/bathroom;Assistance with cooking/housework;Assist for transportation;Help with stairs or ramp for entrance;Direct supervision/assist for medications management    Functional Status Assessment  Patient has had a recent decline in their functional status and demonstrates the ability to make significant improvements in function in a reasonable and predictable amount of time.  Equipment Recommendations  Other (comment) (2WW)    Recommendations for Other Services       Precautions / Restrictions Precautions Precautions: Fall Restrictions Weight Bearing Restrictions: No      Mobility Bed Mobility Overal bed mobility: Needs Assistance Bed Mobility: Supine to Sit, Sit to Supine     Supine to sit: Supervision Sit to supine: Supervision        Transfers Overall transfer level: Needs assistance Equipment used: Rolling walker (2 wheels) Transfers: Sit to/from Stand Sit to Stand: Min guard                  Balance Overall balance assessment: Needs assistance Sitting-balance support: No upper extremity supported, Feet supported, Single extremity supported Sitting balance-Leahy Scale: Fair     Standing balance support: Bilateral upper extremity supported, No upper extremity supported, Single extremity supported, During functional activity Standing balance-Leahy Scale: Fair Standing balance comment: with UE support on the counter while in standing or with UE support on RW pt demo's fair static and fair dynamic  standing balance, when  she lets go of the counter she demo's poor standing balance with slight LOB but able to self correct                           ADL either performed or assessed with clinical judgement   ADL                                         General ADL Comments: Pt donned socks seated EOB with setup and supv, CGA for LB dressing involving ADL transfers, decr tolerance for standing grooming tasks at the sink     Vision         Perception     Praxis      Pertinent Vitals/Pain Pain Assessment Pain Assessment: No/denies pain     Hand Dominance Right   Extremity/Trunk Assessment Upper Extremity Assessment Upper Extremity Assessment: Generalized weakness   Lower Extremity Assessment Lower Extremity Assessment: Generalized weakness       Communication Communication Communication: No difficulties   Cognition Arousal/Alertness: Awake/alert Behavior During Therapy: WFL for tasks assessed/performed Overall Cognitive Status: Within Functional Limits for tasks assessed                                 General Comments: grossly WFL, alert and oriented x4, follows commands     General Comments       Exercises Other Exercises Other Exercises: Pt educated in role of OT, benefits and mgt of RW for safety at home, falls prevention   Shoulder Instructions      Home Living Family/patient expects to be discharged to:: Private residence Living Arrangements: Other relatives;Other (Comment) (granddaughter, 3 pugs) Available Help at Discharge: Family;Available 24 hours/day Type of Home: Mobile home Home Access: Ramped entrance     Home Layout: One level     Bathroom Shower/Tub: Teacher, early years/pre: Standard     Home Equipment: Cane - single point          Prior Functioning/Environment Prior Level of Function : Needs assist             Mobility Comments: pt furniture walks in the home, uses [facility] for out of the  home ADLs Comments: indep with most ADL, seated shower, assist with tub shower transfers, granddaughter assists with IADL including meals, cleaning, transportation, and medications mgt. Pt denies falls in past 75mo       OT Problem List: Decreased strength;Decreased activity tolerance;Impaired balance (sitting and/or standing);Decreased knowledge of use of DME or AE      OT Treatment/Interventions: Self-care/ADL training;Therapeutic exercise;Therapeutic activities;Energy conservation;DME and/or AE instruction;Patient/family education;Balance training    OT Goals(Current goals can be found in the care plan section) Acute Rehab OT Goals Patient Stated Goal: go home OT Goal Formulation: With patient Time For Goal Achievement: 01/05/22 Potential to Achieve Goals: Good ADL Goals Pt Will Transfer to Toilet: with modified independence;ambulating (LRAD) Pt Will Perform Toileting - Clothing Manipulation and hygiene: with modified independence Additional ADL Goal #1: Pt will complete all aspects of bathing, primarily from seated position, with PRN assist for shower transfer, 2/2 opportunities. Additional ADL Goal #2: Pt will verbalize plan to implement at least 2 learned falls prevention/ECS into daily ADL/IADL routine.  OT Frequency: Min 2X/week  Co-evaluation              AM-PAC OT "6 Clicks" Daily Activity     Outcome Measure Help from another person eating meals?: None Help from another person taking care of personal grooming?: A Little Help from another person toileting, which includes using toliet, bedpan, or urinal?: A Little Help from another person bathing (including washing, rinsing, drying)?: A Little Help from another person to put on and taking off regular upper body clothing?: None Help from another person to put on and taking off regular lower body clothing?: A Little 6 Click Score: 20   End of Session Equipment Utilized During Treatment: Rolling walker (2 wheels);Gait  belt Nurse Communication: Mobility status  Activity Tolerance: Patient tolerated treatment well Patient left: in bed;with call bell/phone within reach;with bed alarm set  OT Visit Diagnosis: Other abnormalities of gait and mobility (R26.89);Muscle weakness (generalized) (M62.81)                Time: 1027-1050 OT Time Calculation (min): 23 min Charges:  OT General Charges $OT Visit: 1 Visit OT Evaluation $OT Eval Moderate Complexity: 1 Mod OT Treatments $Self Care/Home Management : 8-22 mins  Ardeth Perfect., MPH, MS, OTR/L ascom [phone] 12/22/21, 11:14 AM

## 2021-12-22 NOTE — Progress Notes (Signed)
Patient refused COVID swab

## 2021-12-22 NOTE — Progress Notes (Addendum)
Patient to dc today, declines HH PT and OT due to dogs at home. RW ordered to be delivered to hospital room through [place].    TOC signing off.   [place], [place]

## 2021-12-22 NOTE — Progress Notes (Signed)
SUBJECTIVE: Ashley Lynch is a 76 y.o. 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 Presented to the ER on 12/19/21 for evaluation of worsening shortness of breath over the previous 1 week associated with bilateral lower extremity swelling, orthopnea and intermittent right-sided chest pain.     Vitals:   12/22/21 0331 12/22/21 0332 12/22/21 0333 12/22/21 0830  BP: 131/63  131/63 (!) 151/67  Pulse: 66  (!) 58 67  Resp:    16  Temp: 98.6 F (37 C)   97.8 F (36.6 C)  TempSrc: Oral     SpO2:   92% 93%  Weight:  51.9 kg    Height:        Intake/Output Summary (Last 24 hours) at 12/22/2021 0835 Last data filed at 12/22/2021 0332 Gross per 24 hour  Intake --  Output 850 ml  Net -850 ml    LABS: Basic Metabolic Panel: Recent Labs    12/21/21 0613 12/22/21 0424  NA 139 136  K 3.7 4.1  CL 105 105  CO2 24 24  GLUCOSE 105* 100*  BUN 42* 47*  CREATININE 1.64* 1.84*  CALCIUM 8.2* 8.2*  MG 2.0 2.3  PHOS 3.5 3.1   Liver Function Tests: Recent Labs    12/19/21 0842  AST 38  ALT 25  ALKPHOS 182*  BILITOT 1.2  PROT 7.4  ALBUMIN 3.4*   No results for input(s): "LIPASE", "AMYLASE" in the last 72 hours. CBC: Recent Labs    12/19/21 0905 12/20/21 0521 12/21/21 0613 12/22/21 0424  WBC 8.0   < > 7.6 9.1  NEUTROABS 5.6  --   --   --   HGB 10.4*   < > 10.7* 10.8*  HCT 31.7*   < > 32.6* 33.1*  MCV 91.6   < > 88.1 89.0  PLT 193   < > 193 208   < > = values in this interval not displayed.   Cardiac Enzymes: No results for input(s): "CKTOTAL", "CKMB", "CKMBINDEX", "TROPONINI" in the last 72 hours. BNP: Invalid input(s): "POCBNP" D-Dimer: No results for input(s): "DDIMER" in the last 72 hours. Hemoglobin A1C: No results for input(s): "HGBA1C" in the last 72 hours. Fasting Lipid Panel: No results for  input(s): "CHOL", "HDL", "LDLCALC", "TRIG", "CHOLHDL", "LDLDIRECT" in the last 72 hours. Thyroid Function Tests: No results for input(s): "TSH", "T4TOTAL", "T3FREE", "THYROIDAB" in the last 72 hours.  Invalid input(s): "FREET3" Anemia Panel: No results for input(s): "VITAMINB12", "FOLATE", "FERRITIN", "TIBC", "IRON", "RETICCTPCT" in the last 72 hours.   PHYSICAL EXAM General: Well developed, well nourished, in no acute distress HEENT:  Normocephalic and atramatic Neck:  No JVD.  Lungs: Clear bilaterally to auscultation and percussion. Heart: HRRR . Normal S1 and S2 without gallops or murmurs.  Abdomen: Bowel sounds are positive, abdomen soft and non-tender  Msk:  Back normal, normal gait. Normal strength and tone for age. Extremities: No clubbing, cyanosis or edema.   Neuro: Alert and oriented X 3. Psych:  Good affect, responds appropriately  TELEMETRY: HR 58 bpm  ASSESSMENT AND PLAN: Patient resting comfortably in bed. Denies chest pain, shortness of breath and lower extremity edema. Echo performed 12/20/21 revealed EF continues to be 25-30%. Continue Entresto, metoprolol. Patient can be discharged home today on furosemide 20 mg daily. Patient can follow up in office on Monday, 12/26/2021 at 10:00 am.   Principal Problem:   Acute on  chronic combined systolic and diastolic CHF (congestive heart failure) (HCC) Active Problems:   Hypertension   Nicotine dependence   Depression   PVD (peripheral vascular disease) ([place])   Ischemic cardiomyopathy   Pressure injury of skin     , FNP-C 12/22/2021 8:35 AM

## 2021-12-22 NOTE — Consult Note (Signed)
PHARMACY CONSULT NOTE - FOLLOW UP  Pharmacy Consult for Electrolyte Monitoring and Replacement   Recent Labs: Potassium (mmol/L)  Date Value  12/22/2021 4.1  12/19/2013 3.9   Magnesium (mg/dL)  Date Value  12/22/2021 2.3   Calcium (mg/dL)  Date Value  12/22/2021 8.2 (L)   Calcium, Total (mg/dL)  Date Value  12/19/2013 9.2   Albumin (g/dL)  Date Value  12/19/2021 3.4 (L)  06/23/2015 4.2  12/19/2013 3.6   Phosphorus (mg/dL)  Date Value  12/22/2021 3.1   Sodium (mmol/L)  Date Value  12/22/2021 136  06/23/2015 136  12/19/2013 139    Assessment: Pt is a 76 yo F here with AoCHF. Pt has HFrEF (LVEF 25-30%) and h/o multivessel CAD s/p PCI with stenting. Regarding HFrEF GDMT, pt currently on Entresto 50 mg BID and Toprol XL 25 daily. Both K and Mg are now at goal. Plan is for pt to be discharged today.  Goal of Therapy:  K >/= 4.0 and Mg >/= 2.0  Plan:  No replacement necessary, electrolytes are at goal  Dara Hoyer, PharmD PGY-1 Pharmacy Resident 12/22/2021 9:58 AM

## 2021-12-22 NOTE — Evaluation (Signed)
Physical Therapy Evaluation Patient Details Name: Ashley Lynch MRN: [id number] DOB: April 28, 1945 Today's Date: 12/22/2021  History of Present Illness  76 y.o. female with medical history significant for CAD with severe multivessel disease, s/p PCI with stent angioplasty, PAD, nicotine dependence, ischemic cardiomyopathy with last known LVEF of 25% who presented to the ER for evaluation of worsening shortness of breath over the last 1 week associated with bilateral lower extremity swelling, orthopnea and intermittent right-sided chest pain.  Clinical Impression  Pt presents to PT in bed and agreeable to participate in therapy services. Pt able to perform bed mobility and transfers w no physical assistance from rehab staff, but did require extended time and effort.  Pt amb w decr cadence and flexed trunk but had no LOB. Pt edu on proper technique for RW use to maintain safety in the home. Pt reports fatigue following walk to/from bathroom, displaying decr activity tolerance, edu on energy conservation techniques provided. Pt verbalized understanding. Would benefit from skilled HHPT to address above deficits in strength, balance, functional mobility, DME use, and activity tolerance to promote optimal return to PLOF.      Recommendations for follow up therapy are one component of a multi-disciplinary discharge planning process, led by the attending physician.  Recommendations may be updated based on patient status, additional functional criteria and insurance authorization.  Follow Up Recommendations Home health PT      Assistance Recommended at Discharge Frequent or constant Supervision/Assistance  Patient can return home with the following  A little help with walking and/or transfers;A little help with bathing/dressing/bathroom;Help with stairs or ramp for entrance;Assist for transportation;Direct supervision/assist for medications management;Assistance with cooking/housework    Equipment  Recommendations Rolling walker (2 wheels)  Recommendations for Other Services       Functional Status Assessment Patient has had a recent decline in their functional status and demonstrates the ability to make significant improvements in function in a reasonable and predictable amount of time.     Precautions / Restrictions Precautions Precautions: Fall Restrictions Weight Bearing Restrictions: No      Mobility  Bed Mobility Overal bed mobility: Needs Assistance Bed Mobility: Supine to Sit, Sit to Supine     Supine to sit: Supervision Sit to supine: Supervision        Transfers Overall transfer level: Needs assistance Equipment used: Rolling walker (2 wheels) Transfers: Sit to/from Stand Sit to Stand: Min guard                Ambulation/Gait Ambulation/Gait assistance: Min guard Gait Distance (Feet): 20 Feet Assistive device: Rolling walker (2 wheels) Gait Pattern/deviations: Step-through pattern, Trunk flexed Gait velocity: decr     General Gait Details: slow cadence w trunk flexed. no LOB  Stairs            Wheelchair Mobility    Modified Rankin (Stroke Patients Only)       Balance Overall balance assessment: Needs assistance Sitting-balance support: No upper extremity supported, Feet supported, Single extremity supported Sitting balance-Leahy Scale: Fair     Standing balance support: During functional activity, Bilateral upper extremity supported, Reliant on assistive device for balance Standing balance-Leahy Scale: Fair                               Pertinent Vitals/Pain Pain Assessment Pain Assessment: No/denies pain    Home Living Family/patient expects to be discharged to:: Private residence Living Arrangements: Other relatives;Other (Comment) Available Help at  Discharge: Family;Available 24 hours/day Type of Home: Mobile home Home Access: Ramped entrance       Home Layout: One level Home Equipment: Cane -  single point      Prior Function Prior Level of Function : Needs assist             Mobility Comments: limited community amb w SPC when necessary, primarily household distances w furniture ADLs Comments: indep with most ADL, seated shower, assist with tub shower transfers, granddaughter assists with IADL including meals, cleaning, transportation, and medications mgt. Pt denies falls in past 85mo    Hand Dominance   Dominant Hand: Right    Extremity/Trunk Assessment   Upper Extremity Assessment Upper Extremity Assessment: Generalized weakness    Lower Extremity Assessment Lower Extremity Assessment: Generalized weakness    Cervical / Trunk Assessment Cervical / Trunk Assessment: Kyphotic  Communication   Communication: No difficulties  Cognition Arousal/Alertness: Awake/alert Behavior During Therapy: WFL for tasks assessed/performed Overall Cognitive Status: Within Functional Limits for tasks assessed                                 General Comments: grossly WFL, alert and oriented x4, follows commands        General Comments      Exercises Other Exercises Other Exercises: importance of use of RW at home for safety, fall prevention, and energy conservation techniques   Assessment/Plan    PT Assessment Patient needs continued PT services  PT Problem List Decreased strength;Decreased activity tolerance;Decreased balance;Decreased mobility;Decreased knowledge of use of DME;Decreased safety awareness       PT Treatment Interventions DME instruction;Therapeutic activities;Gait training;Therapeutic exercise;Patient/family education;Balance training;Functional mobility training;Neuromuscular re-education    PT Goals (Current goals can be found in the Care Plan section)  Acute Rehab PT Goals Patient Stated Goal: to go home PT Goal Formulation: With patient Time For Goal Achievement: 01/04/22 Potential to Achieve Goals: Good    Frequency Min  2X/week     Co-evaluation               AM-PAC PT "6 Clicks" Mobility  Outcome Measure Help needed turning from your back to your side while in a flat bed without using bedrails?: A Little Help needed moving from lying on your back to sitting on the side of a flat bed without using bedrails?: A Little Help needed moving to and from a bed to a chair (including a wheelchair)?: A Little Help needed standing up from a chair using your arms (e.g., wheelchair or bedside chair)?: A Little Help needed to walk in hospital room?: A Little Help needed climbing 3-5 steps with a railing? : A Lot 6 Click Score: 17    End of Session Equipment Utilized During Treatment: Gait belt Activity Tolerance: Patient tolerated treatment well;Patient limited by fatigue Patient left: in bed;with call bell/phone within reach;with bed alarm set   PT Visit Diagnosis: Muscle weakness (generalized) (M62.81);Unsteadiness on feet (R26.81)    Time: [address]PT Time Calculation (min) (ACUTE ONLY): 18 min   Charges:             MGlenice LaineMPH, SPT 12/22/21, 12:00 PM

## 2021-12-22 NOTE — Care Management Important Message (Signed)
Important Message  Patient Details  Name: Ashley Lynch MRN: [id number] Date of Birth: [birth year]   Medicare Important Message Given:  Yes     Dannette Barbara 12/22/2021, 1:59 PM

## 2021-12-22 NOTE — Discharge Summary (Signed)
Physician Discharge Summary   Patient: Ashley Lynch MRN: [id number] DOB: 01-Mar-1946  Admit date:     12/19/2021  Discharge date: 12/22/21  Discharge Physician: Fritzi Mandes   PCP: Perrin Maltese, MD   Recommendations at discharge:    F/u PCP in 1-2 weeks F/u Dr Humphrey Rolls cardiology in 1-2 weeks  Discharge Diagnoses: Principal Problem:   Acute on chronic combined systolic and diastolic CHF (congestive heart failure) ([place]) Active Problems:   Ischemic cardiomyopathy   Hypertension   Nicotine dependence   Depression   PVD (peripheral vascular disease) ([place])   Pressure injury of skin  Hospital Course:  Ashley Lynch is a 76 y.o. female with medical history significant for coronary artery disease with severe multivessel disease, status post PCI with stent angioplasty, peripheral arterial disease, nicotine dependence, ischemic cardiomyopathy with last known LVEF of 25% who presented to the ER for evaluation of worsening shortness of breath over the last 1 week associated with bilateral lower extremity swelling, orthopnea and intermittent right-sided chest pain.   Acute on chronic combined systolic and diastolic CHF (congestive heart failure) ([place]) Patient presents for evaluation of worsening shortness of breath, orthopnea and bilateral lower extremity swelling BNP is elevated and chest x-ray shows findings suggestive of acute CHF --LVEF 25-30% with grade I DD --started on Lasix 40 mg IV twice daily--change to oral 20 mg qd --cardiology consulted--Dr Humphrey Rolls --Continue metoprolol and Entresto --pt on RA   Ischemic cardiomyopathy history of multivessel coronary artery disease status post PCI Her last known LVEF is 25% from a 2D echocardiogram which was done 01/23 Continue metoprolol, aspirin, Plavix, atorvastatin and Entresto   Pressure injury of skin Sacrum, stage 2, POA   PVD (peripheral vascular disease) ([place]) Continue atorvastatin, aspirin, Plavix   Depression Continue sertraline    Nicotine dependence Smoking cessation was discussed with patient in detail Continue nicotine transdermal patch   Hypertension --cont Toprol and Entresto      DVT prophylaxis: Lovenox SQ Code Status: Full code  Family Communication: granddaughter updated at bedside today   Patient is not want to have physical therapy at h ome. Granddaughter aware.  Overall improving D/c home    Pain control - [facility] Controlled Substance Reporting System database was reviewed. and patient was instructed, not to drive, operate heavy machinery, perform activities at heights, swimming or participation in water activities or provide baby-sitting services while on Pain, Sleep and Anxiety Medications; until their outpatient Physician has advised to do so again. Also recommended to not to take more than prescribed Pain, Sleep and Anxiety Medications.  Consultants: Dr Humphrey Rolls  Disposition: Home Diet recommendation:  Discharge Diet Orders (From admission, onward)     Start     Ordered   12/22/21 0000  Diet - low sodium heart healthy        12/22/21 1051           Cardiac diet DISCHARGE MEDICATION: Allergies as of 12/22/2021   No Known Allergies      Medication List     STOP taking these medications    amLODipine 5 MG tablet Commonly known as: NORVASC   benazepril 20 MG tablet Commonly known as: LOTENSIN   cyanocobalamin 1000 MCG tablet Commonly known as: VITAMIN B12   fluticasone 50 MCG/ACT nasal spray Commonly known as: FLONASE   MAGNESIUM PO   rosuvastatin 10 MG tablet Commonly known as: CRESTOR   ticagrelor 90 MG Tabs tablet Commonly known as: [employer]  TAKE these medications    aspirin 81 MG tablet Take 81 mg by mouth daily.   atorvastatin 80 MG tablet Commonly known as: LIPITOR Take 80 mg by mouth.   clopidogrel 75 MG tablet Commonly known as: PLAVIX Take 1 tablet (75 mg total) by mouth daily.   furosemide 20 MG tablet Commonly known as:  LASIX Take 1 tablet (20 mg total) by mouth daily.   Gemtesa 75 MG Tabs Generic drug: Vibegron Take 75 mg by mouth daily.   metoprolol succinate 25 MG 24 hr tablet Commonly known as: TOPROL-XL Take 1 tablet (25 mg total) by mouth daily. What changed: Another medication with the same name was added. Make sure you understand how and when to take each.   metoprolol succinate 25 MG 24 hr tablet Commonly known as: TOPROL-XL Take 1 tablet (25 mg total) by mouth daily. What changed: You were already taking a medication with the same name, and this prescription was added. Make sure you understand how and when to take each.   multivitamin with minerals Tabs tablet Take 1 tablet by mouth daily.   Nicoderm CQ 14 mg/24hr patch Generic drug: nicotine Place 1 patch onto the skin daily.   nitroGLYCERIN 0.4 MG SL tablet Commonly known as: NITROSTAT Place 0.4 tablets under the tongue daily as needed.   omeprazole 20 MG capsule Commonly known as: PRILOSEC Take 1 capsule (20 mg total) by mouth daily.   sacubitril-valsartan 24-26 MG Commonly known as: ENTRESTO Take 1 tablet by mouth 2 (two) times daily.   sertraline 50 MG tablet Commonly known as: ZOLOFT Take 50 mg by mouth daily. What changed: Another medication with the same name was removed. Continue taking this medication, and follow the directions you see here.   traMADol 50 MG tablet Commonly known as: ULTRAM Take 50 mg by mouth 3 (three) times daily as needed.               Durable Medical Equipment  (From admission, onward)           Start     Ordered   12/22/21 1048  For home use only DME Walker rolling  Once       Question Answer Comment  Walker: With 5 Inch Wheels   Patient needs a walker to treat with the following condition General weakness      12/22/21 1047              Discharge Care Instructions  (From admission, onward)           Start     Ordered   12/22/21 0000  Discharge wound care:        Comments: Pressure Injury 12/19/21 Sacrum Right;Left Stage 2 -  Partial thickness loss of dermis presenting as a shallow open injury with a red, pink wound bed without slough.   12/22/21 1051            Follow-up Information     Perrin Maltese, MD. Schedule an appointment as soon as possible for a visit in 1 week(s).   Specialty: Internal Medicine Why: Ronney Asters information: [place] [address] [phone]         Dionisio David, MD. Schedule an appointment as soon as possible for a visit in 1 week(s).   Specialty: Cardiology Why: chf f/u Contact information: [place] [address] [phone]                Discharge Exam:  Filed Weights   12/20/21 0500 12/21/21 0500 12/22/21 0332  Weight: 55.3 kg 53.8 kg 51.9 kg     Condition at discharge: fair  The results of significant diagnostics from this hospitalization (including imaging, microbiology, ancillary and laboratory) are listed below for reference.   Imaging Studies: ECHOCARDIOGRAM COMPLETE  Result Date: 12/20/2021    ECHOCARDIOGRAM REPORT   Patient Name:   DEVONY MCGRADY Date of Exam: 12/20/2021 Medical Rec #:  [id number]     Height:       63.0 in Accession #:    [id number]    Weight:       121.9 lb Date of Birth:  08-Feb-1946     BSA:          1.567 m Patient Age:    23 years      BP:           164/101 mmHg Patient Gender: F             HR:           63 bpm. Exam Location:  [facility] Procedure: 2D Echo, Cardiac Doppler and Color Doppler Indications:     CHF-acute systolic V37.10  History:         Patient has prior history of Echocardiogram examinations, most                  recent 01/13/2020. Cardiomyopathy, Previous Myocardial                  Infarction; Risk Factors:Hypertension. Heart failure with                  reduced ejection fraction.  Sonographer:     Sherrie Sport Referring Phys:  [place] Diagnosing Phys: Serafina Royals MD  Sonographer Comments: Suboptimal  apical window. IMPRESSIONS  1. Left ventricular ejection fraction, by estimation, is 25 to 30%. The left ventricle has severely decreased function. The left ventricle demonstrates global hypokinesis. The left ventricular internal cavity size was mildly dilated. Left ventricular diastolic parameters are consistent with Grade I diastolic dysfunction (impaired relaxation).  2. Right ventricular systolic function is normal. The right ventricular size is normal.  3. Left atrial size was mild to moderately dilated.  4. Right atrial size was mild to moderately dilated.  5. Large pleural effusion in the left lateral region.  6. The mitral valve is normal in structure. Moderate mitral valve regurgitation.  7. Tricuspid valve regurgitation is moderate.  8. The aortic valve is normal in structure. Aortic valve regurgitation is trivial. FINDINGS  Left Ventricle: Left ventricular ejection fraction, by estimation, is 25 to 30%. The left ventricle has severely decreased function. The left ventricle demonstrates global hypokinesis. The left ventricular internal cavity size was mildly dilated. There is borderline left ventricular hypertrophy. Left ventricular diastolic parameters are consistent with Grade I diastolic dysfunction (impaired relaxation). Right Ventricle: The right ventricular size is normal. No increase in right ventricular wall thickness. Right ventricular systolic function is normal. Left Atrium: Left atrial size was mild to moderately dilated. Right Atrium: Right atrial size was mild to moderately dilated. Pericardium: There is no evidence of pericardial effusion. Mitral Valve: The mitral valve is normal in structure. Moderate mitral valve regurgitation. Tricuspid Valve: The tricuspid valve is normal in structure. Tricuspid valve regurgitation is moderate. Aortic Valve: The aortic valve is normal in structure. Aortic valve regurgitation is trivial. Aortic valve mean gradient measures 2.0 mmHg. Aortic valve peak  gradient measures 3.1 mmHg. Aortic  valve area, by VTI measures 2.32 cm. Pulmonic Valve: The pulmonic valve was normal in structure. Pulmonic valve regurgitation is trivial. Aorta: The aortic root and ascending aorta are structurally normal, with no evidence of dilitation. IAS/Shunts: No atrial level shunt detected by color flow Doppler. Additional Comments: There is a large pleural effusion in the left lateral region.  LEFT VENTRICLE PLAX 2D LVIDd:         5.10 cm      Diastology LVIDs:         4.60 cm      LV e' lateral:   5.98 cm/s LV PW:         1.10 cm      LV E/e' lateral: 15.2 LV IVS:        1.30 cm LVOT diam:     2.00 cm LV SV:         30 LV SV Index:   19 LVOT Area:     3.14 cm  LV Volumes (MOD) LV vol d, MOD A2C: 119.0 ml LV vol d, MOD A4C: 93.7 ml LV vol s, MOD A2C: 87.2 ml LV vol s, MOD A4C: 66.0 ml LV SV MOD A2C:     31.8 ml LV SV MOD A4C:     93.7 ml LV SV MOD BP:      29.5 ml RIGHT VENTRICLE RV Basal diam:  3.60 cm RV S prime:     7.29 cm/s TAPSE (M-mode): 1.2 cm LEFT ATRIUM             Index        RIGHT ATRIUM           Index LA diam:        4.50 cm 2.87 cm/m   RA Area:     17.30 cm LA Vol (A2C):   90.1 ml 57.51 ml/m  RA Volume:   49.50 ml  31.59 ml/m LA Vol (A4C):   61.4 ml 39.19 ml/m LA Biplane Vol: 75.5 ml 48.19 ml/m  AORTIC VALVE AV Area (Vmax):    1.87 cm AV Area (Vmean):   1.96 cm AV Area (VTI):     2.32 cm AV Vmax:           87.50 cm/s AV Vmean:          60.400 cm/s AV VTI:            0.131 m AV Peak Grad:      3.1 mmHg AV Mean Grad:      2.0 mmHg LVOT Vmax:         52.20 cm/s LVOT Vmean:        37.700 cm/s LVOT VTI:          0.097 m LVOT/AV VTI ratio: 0.74  AORTA Ao Root diam: 2.60 cm MITRAL VALVE               TRICUSPID VALVE MV Area (PHT): 5.13 cm    TR Peak grad:   46.8 mmHg MV Decel Time: 148 msec    TR Vmax:        342.00 cm/s MV E velocity: 90.80 cm/s MV A velocity: 37.70 cm/s  SHUNTS MV E/A ratio:  2.41        Systemic VTI:  0.10 m                            Systemic Diam:  2.00 cm Serafina Royals MD Electronically signed  by Serafina Royals MD Signature Date/Time: 12/20/2021/12:25:17 PM    Final    DG Shoulder Right  Result Date: 12/19/2021 CLINICAL DATA:  Pain right shoulder EXAM: RIGHT SHOULDER - 2+ VIEW COMPARISON:  12/19/2021 FINDINGS: No fracture or dislocation is seen. Osteopenia is seen in bony structures. Bony spurs are seen in right AC joint. Surgical clips are seen in right side of neck. IMPRESSION: No recent fracture or dislocation is seen. Degenerative changes are noted with bony spurs in the right AC joint. Electronically Signed   By: Elmer Picker M.D.   On: 12/19/2021 14:36   DG Chest Portable 1 View  Result Date: 12/19/2021 CLINICAL DATA:  Chest pain, shortness of breath. EXAM: PORTABLE CHEST 1 VIEW COMPARISON:  January 12, 2020. FINDINGS: Mild cardiomegaly is noted with central pulmonary vascular congestion. Bilateral interstitial and airspace opacities are noted throughout both lungs most consistent with pulmonary edema and associated bibasilar atelectasis and pleural effusions, although multifocal pneumonia cannot be excluded. Bony thorax is unremarkable. IMPRESSION: Mild cardiomegaly with central pulmonary vascular congestion. Bilateral lung opacities are noted most consistent with pulmonary edema and associated bibasilar atelectasis and pleural effusions, although multifocal pneumonia cannot be excluded. Electronically Signed   By: Marijo Conception M.D.   On: 12/19/2021 09:11    Microbiology: Results for orders placed or performed during the hospital encounter of 01/12/20  Respiratory Panel by RT PCR (Flu A&B, Covid) - Nasopharyngeal Swab     Status: None   Collection Time: 01/12/20  7:16 PM   Specimen: Nasopharyngeal Swab  Result Value Ref Range Status   SARS Coronavirus 2 by RT PCR NEGATIVE NEGATIVE Final    Comment: (NOTE) SARS-CoV-2 target nucleic acids are NOT DETECTED.  The SARS-CoV-2 RNA is generally detectable in upper  respiratoy specimens during the acute phase of infection. The lowest concentration of SARS-CoV-2 viral copies this assay can detect is 131 copies/mL. A negative result does not preclude SARS-Cov-2 infection and should not be used as the sole basis for treatment or other patient management decisions. A negative result may occur with  improper specimen collection/handling, submission of specimen other than nasopharyngeal swab, presence of viral mutation(s) within the areas targeted by this assay, and inadequate number of viral copies (<131 copies/mL). A negative result must be combined with clinical observations, patient history, and epidemiological information. The expected result is Negative.  Fact Sheet for Patients:  PinkCheek.be  Fact Sheet for Healthcare Providers:  GravelBags.it  This test is no t yet approved or cleared by the [place] FDA and  has been authorized for detection and/or diagnosis of SARS-CoV-2 by FDA under an Emergency Use Authorization (EUA). This EUA will remain  in effect (meaning this test can be used) for the duration of the COVID-19 declaration under Section 564(b)(1) of the Act, 21 U.S.C. section 360bbb-3(b)(1), unless the authorization is terminated or revoked sooner.     Influenza A by PCR NEGATIVE NEGATIVE Final   Influenza B by PCR NEGATIVE NEGATIVE Final    Comment: (NOTE) The Xpert Xpress SARS-CoV-2/FLU/RSV assay is intended as an aid in  the diagnosis of influenza from Nasopharyngeal swab specimens and  should not be used as a sole basis for treatment. Nasal washings and  aspirates are unacceptable for Xpert Xpress SARS-CoV-2/FLU/RSV  testing.  Fact Sheet for Patients: PinkCheek.be  Fact Sheet for Healthcare Providers: GravelBags.it  This test is not yet approved or cleared by the [place] FDA and  has been  authorized for detection and/or diagnosis of  SARS-CoV-2 by  FDA under an Emergency Use Authorization (EUA). This EUA will remain  in effect (meaning this test can be used) for the duration of the  Covid-19 declaration under Section 564(b)(1) of the Act, 21  U.S.C. section 360bbb-3(b)(1), unless the authorization is  terminated or revoked. Performed at [facility], [address] [place], [place] [address]     Labs: CBC: Recent Labs  Lab 12/19/21 [phone] 12/20/21 0521 12/21/21 0613 12/22/21 0424  WBC 8.0 7.3 7.6 9.1  NEUTROABS 5.6  --   --   --   HGB 10.4* 10.2* 10.7* 10.8*  HCT 31.7* 31.2* 32.6* 33.1*  MCV 91.6 88.6 88.1 89.0  PLT 193 170 193 920   Basic Metabolic Panel: Recent Labs  Lab 12/19/21 0842 12/20/21 0521 12/21/21 0613 12/22/21 0424  NA 140 139 139 136  K 4.5 3.5 3.7 4.1  CL 110 107 105 105  CO2 21* 21* 24 24  GLUCOSE 114* 107* 105* 100*  BUN 37* 40* 42* 47*  CREATININE 1.54* 1.58* 1.64* 1.84*  CALCIUM 8.9 8.5* 8.2* 8.2*  MG  --  1.9 2.0 2.3  PHOS  --   --  3.5 3.1   Liver Function Tests: Recent Labs  Lab 12/19/21 0842  AST 38  ALT 25  ALKPHOS 182*  BILITOT 1.2  PROT 7.4  ALBUMIN 3.4*   CBG: Recent Labs  Lab 12/19/21 2058  GLUCAP 126*    Discharge time spent: greater than 30 minutes.  Signed: Fritzi Mandes, MD [facility] 12/22/2021

## 2022-01-03 NOTE — Progress Notes (Deleted)
   Patient ID: Ashley Lynch, female    DOB: [birth year], 76 y.o.   MRN: [id number]  HPI  Ms Heishman is a 76 y/o female with a history of  Echo report from 12/20/21 reviewed and showed an EF of 25-30% along with moderate MR/TR and large pleural effusion in left lateral area.  LHC done 01/13/20 and showed: Mid RCA lesion is 100% stenosed. Prox RCA lesion is 70% stenosed. Prox LAD to Mid LAD lesion is 80% stenosed. Prox Cx to Mid Cx lesion is 70% stenosed.  Three-vessel coronary artery disease with occluded distal RCA and high-grade mid LAD lesion and borderline left ventricular systolic function and severe peripheral vascular disease with occluded right iliac in the midportion.    Admitted 12/19/21 due to worsening shortness of breath over the last 1 week associated with bilateral lower extremity swelling, orthopnea and intermittent right-sided chest pain. Cardiology consult obtained. Initially given IV lasix with transition to oral diuretics. Discharged after 3 days.   She presents today for her initial visit with a chief complaint of   Review of Systems    Physical Exam    Assessment & Plan:  1: Chronic heart failure with reduced ejection fraction- - NYHA class - saw cardiology Laurelyn Sickle) 12/26/21 - BNP 12/19/21 was >4500  2: HTN- - BP - saw PCP Wolfgang Phoenix) 12/23/21 - BMP 12/22/21 reviewed and showed sodium 136, potassium 4.1, creatinine 1.84 and GFR 28  3: PVD-  4: Tobacco use-

## 2022-01-04 ENCOUNTER — Ambulatory Visit: Payer: Medicare PPO | Admitting: Family

## 2022-01-04 ENCOUNTER — Telehealth: Payer: Self-pay | Admitting: Family

## 2022-01-04 NOTE — Telephone Encounter (Signed)
Patient did not show for her initial [facility] appointment on 01/04/22. Will attempt to reschedule.

## 2022-05-26 ENCOUNTER — Other Ambulatory Visit: Payer: Self-pay

## 2022-05-29 ENCOUNTER — Ambulatory Visit: Payer: TRICARE For Life (TFL) | Admitting: Internal Medicine

## 2022-05-30 ENCOUNTER — Other Ambulatory Visit: Payer: Self-pay | Admitting: Internal Medicine

## 2022-06-08 DIAGNOSIS — B351 Tinea unguium: Secondary | ICD-10-CM | POA: Diagnosis not present

## 2022-06-08 DIAGNOSIS — M79675 Pain in left toe(s): Secondary | ICD-10-CM | POA: Diagnosis not present

## 2022-06-08 DIAGNOSIS — L600 Ingrowing nail: Secondary | ICD-10-CM | POA: Diagnosis not present

## 2022-06-08 DIAGNOSIS — M79674 Pain in right toe(s): Secondary | ICD-10-CM | POA: Diagnosis not present

## 2022-06-30 ENCOUNTER — Other Ambulatory Visit: Payer: Self-pay | Admitting: Internal Medicine

## 2022-07-31 ENCOUNTER — Telehealth: Payer: Self-pay

## 2022-07-31 NOTE — Telephone Encounter (Signed)
Pt called and left vm regarding if she can get refill on rx tramadol said you sent rx for her a while ago but has no refills, called pt & told her you would probably want to see her for an appt first but that I would ask. She also mentioned she isn't sure if she can come in right now due to back pain being so severe. Please advise

## 2022-11-04 ENCOUNTER — Emergency Department: Payer: Medicare PPO

## 2022-11-04 ENCOUNTER — Other Ambulatory Visit: Payer: Self-pay

## 2022-11-04 ENCOUNTER — Emergency Department
Admission: EM | Admit: 2022-11-04 | Discharge: 2022-11-04 | Disposition: A | Discharge status: Home / Self Care | Payer: Medicare PPO | Attending: Emergency Medicine | Admitting: Emergency Medicine

## 2022-11-04 DIAGNOSIS — I110 Hypertensive heart disease with heart failure: Secondary | ICD-10-CM | POA: Insufficient documentation

## 2022-11-04 DIAGNOSIS — M545 Low back pain, unspecified: Secondary | ICD-10-CM | POA: Diagnosis present

## 2022-11-04 DIAGNOSIS — S32030A Wedge compression fracture of third lumbar vertebra, initial encounter for closed fracture: Secondary | ICD-10-CM | POA: Insufficient documentation

## 2022-11-04 DIAGNOSIS — X58XXXA Exposure to other specified factors, initial encounter: Secondary | ICD-10-CM | POA: Insufficient documentation

## 2022-11-04 DIAGNOSIS — M4807 Spinal stenosis, lumbosacral region: Secondary | ICD-10-CM | POA: Diagnosis not present

## 2022-11-04 DIAGNOSIS — M48061 Spinal stenosis, lumbar region without neurogenic claudication: Secondary | ICD-10-CM | POA: Diagnosis not present

## 2022-11-04 DIAGNOSIS — I10 Essential (primary) hypertension: Secondary | ICD-10-CM | POA: Diagnosis not present

## 2022-11-04 DIAGNOSIS — I509 Heart failure, unspecified: Secondary | ICD-10-CM | POA: Diagnosis not present

## 2022-11-04 DIAGNOSIS — M549 Dorsalgia, unspecified: Secondary | ICD-10-CM | POA: Diagnosis not present

## 2022-11-04 DIAGNOSIS — M4856XA Collapsed vertebra, not elsewhere classified, lumbar region, initial encounter for fracture: Secondary | ICD-10-CM | POA: Diagnosis not present

## 2022-11-04 DIAGNOSIS — M5136 Other intervertebral disc degeneration, lumbar region: Secondary | ICD-10-CM | POA: Diagnosis not present

## 2022-11-04 MED ORDER — TRAMADOL HCL 50 MG PO TABS: 50.0000 mg | ORAL_TABLET | Freq: Three times a day (TID) | ORAL | 0 refills | Status: DC | PRN

## 2022-11-04 NOTE — ED Provider Triage Note (Signed)
Emergency Medicine Provider Triage Evaluation Note  Ashley Lynch , a 77 y.o. female  was evaluated in triage.  Pt complains of low back pain since July 4.  Review of Systems  Positive:  Negative:   Physical Exam  BP (!) 140/98   Pulse 89   Temp 98 F (36.7 C) (Oral)   Resp 16   SpO2 100%  Gen:   Awake, no distress   Resp:  Normal effort  MSK:   Moves extremities without difficulty  Other:    Medical Decision Making  Medically screening exam initiated at 5:35 PM.  Appropriate orders placed.  STEPHENIE NAVEJAS was informed that the remainder of the evaluation will be completed by another provider, this initial triage assessment does not replace that evaluation, and the importance of remaining in the ED until their evaluation is complete.     Faythe Ghee, PA-C 11/04/22 1736

## 2022-11-04 NOTE — Discharge Instructions (Addendum)
Your CT scan showed that you have a compression fracture of one of the bones in your spine.  Please follow-up with orthopedics, their information is attached.  You can take 650 mg of Tylenol every 6 hours as needed for mild to moderate pain.  You can take tramadol 3 times daily as needed for severe pain.  Continue to apply topical pain relievers.  You can return to the ED with any new or worsening back pain or shortness of breath.

## 2022-11-04 NOTE — ED Provider Notes (Signed)
Ashley Lynch - [place] Provider Note    Event Date/Time   First MD Initiated Contact with Patient 11/04/22 1837     (approximate)   History   Back Pain (Since July 4th)   HPI  TOINETTE LACKIE is a 77 y.o. female  with PMH of MI, cancer, heart failure and HTN presents for evaluation of back pain that began around July 4.  Patient denies any falls or trauma to the area.  She describes her pain as sharp and it is bilaterally across her lower back.  She denies any numbness, tingling, weakness or radiation down into her legs.  Patient is ambulatory but states she has pain with walking.  She reports an improvement in her symptoms when she can sit and rest.      Physical Exam   Triage Vital Signs: ED Triage Vitals [11/04/22 1735]  Encounter Vitals Group     BP (!) 140/98     Systolic BP Percentile      Diastolic BP Percentile      Pulse Rate 89     Resp 16     Temp 98 F (36.7 C)     Temp Source Oral     SpO2 100 %     Weight      Height 5\' 3"  (1.6 m)     Head Circumference      Peak Flow      Pain Score 10     Pain Loc      Pain Education      Exclude from Growth Chart     Most recent vital signs: Vitals:   11/04/22 1735  BP: (!) 140/98  Pulse: 89  Resp: 16  Temp: 98 F (36.7 C)  SpO2: 100%     General: Awake, no distress.  CV:  Good peripheral perfusion.  RRR. Resp:  Normal effort.  CTAB. Abd:  No distention.  Other:  Bilateral dorsal pedis pulses 2+ and regular, sensation intact across all dermatomes in the lower extremities, 5/5 strength of lower extremities   ED Results / Procedures / Treatments   Labs (all labs ordered are listed, but only abnormal results are displayed) Labs Reviewed - No data to display   RADIOLOGY  CT lumbar spine obtained, interpreted the images as well as reviewed the radiologist report.  PROCEDURES:  Critical Care performed: No  Procedures   MEDICATIONS ORDERED IN ED: Medications - No data to  display   IMPRESSION / MDM / ASSESSMENT AND PLAN / ED COURSE  I reviewed the triage vital signs and the nursing notes.                             77 year old female presents for evaluation of low back pain  Differential diagnosis includes, but is not limited to, muscle strain, sciatica, compression fracture, degenerative disc disease, spinal stenosis.  Patient's presentation is most consistent with acute complicated illness / injury requiring diagnostic workup.  CT lumbar spine obtained, I reviewed and interpreted the images as well as reviewed the radiologist report.  Images show a subacute on chronic compression fracture of L3 causing central canal stenosis and large bilateral pleural effusions.  I discussed these results with the patient and explained that the compression fracture is likely the cause of her back pain.  Since patient did not report any falls or trauma preceding her back pain, I am concerned that this compression fracture is the result  of osteoporosis.  I explained this to the patient and advised her to follow-up with her primary care provider for a possible DEXA scan and medication to treat this.  I will also give patient follow-up with Ortho.   I also offered to look into the pleural effusions given patient's history of heart failure and lymphoma however patient declined further workup for this.  I explained that she can return to the ED if she has any worsening SOB.   Patient voiced understanding, all questions were answered and she was stable at discharge.     FINAL CLINICAL IMPRESSION(S) / ED DIAGNOSES   Final diagnoses:  Closed compression fracture of L3 lumbar vertebra, initial encounter (HCC)     Rx / DC Orders   ED Discharge Orders          Ordered    traMADol (ULTRAM) 50 MG tablet  3 times daily PRN        11/04/22 1940             Note:  This document was prepared using Dragon voice recognition software and may include unintentional dictation  errors.   Cameron Ali, PA-C 11/04/22 1943    Jene Every, MD 11/04/22 1944

## 2022-11-04 NOTE — ED Triage Notes (Signed)
Pt to ed from home via [facility] for back pain that started on July 4th. Pt denies any trauma. Pt is caox4, in no acute distress and in wheel chair in triage.

## 2022-11-04 NOTE — ED Notes (Signed)
Pt to ER via EMS from home with c/o lower back pain since 7/4 with no known injury.  Pt reports pain increasing over last 3-4 days and worsens with movement.  Pt is ambulatory with 1 person assist. EMS noted HTN 209/144, pt is noncompliant with med.

## 2022-11-07 ENCOUNTER — Telehealth: Payer: Self-pay | Admitting: *Deleted

## 2022-11-07 NOTE — Telephone Encounter (Signed)
Transition Care Management Unsuccessful Follow-up Telephone Call  Date of discharge and from where:  [facility] 7/20  Attempts:  1st Attempt  Reason for unsuccessful TCM follow-up call:  Left voice message

## 2022-11-13 ENCOUNTER — Telehealth: Payer: Self-pay | Admitting: *Deleted

## 2022-11-13 NOTE — Progress Notes (Unsigned)
  Care Coordination  Outreach Note  11/13/2022 Name: ZADIE VETRANO MRN: [id number] DOB: 1945/09/20   Care Coordination Outreach Attempts: An unsuccessful telephone outreach was attempted today to offer the patient information about available care coordination services.  Follow Up Plan:  Additional outreach attempts will be made to offer the patient care coordination information and services.   Encounter Outcome:  No Answer  Burman Nieves, CCMA Care Coordination Care Guide Direct Dial: [phone]

## 2022-11-14 NOTE — Progress Notes (Unsigned)
  Care Coordination  Outreach Note  11/14/2022 Name: Ashley Lynch MRN: [id number] DOB: [birth year]   Care Coordination Outreach Attempts: A second unsuccessful outreach was attempted today to offer the patient with information about available care coordination services.  Follow Up Plan:  Additional outreach attempts will be made to offer the patient care coordination information and services.   Encounter Outcome:  No Answer  Burman Nieves, CCMA Care Coordination Care Guide Direct Dial: [phone]

## 2022-11-16 NOTE — Progress Notes (Signed)
  Care Coordination  Outreach Note  11/16/2022 Name: Ashley Lynch MRN: [id number] DOB: [birth year]   Care Coordination Outreach Attempts: A third unsuccessful outreach was attempted today to offer the patient with information about available care coordination services.  Follow Up Plan:  No further outreach attempts will be made at this time. We have been unable to contact the patient to offer or enroll patient in care coordination services  Encounter Outcome:  No Answer  Burman Nieves, [facility] Care Coordination Care Guide Direct Dial: [phone]

## 2022-11-30 ENCOUNTER — Other Ambulatory Visit: Payer: Self-pay

## 2022-11-30 ENCOUNTER — Inpatient Hospital Stay
Admission: EM | Admit: 2022-11-30 | Discharge: 2022-12-03 | DRG: 291 | Discharge status: Against Medical Advice | Payer: Medicare PPO | Attending: Hospitalist | Admitting: Hospitalist

## 2022-11-30 ENCOUNTER — Encounter: Payer: Self-pay | Admitting: Emergency Medicine

## 2022-11-30 DIAGNOSIS — E872 Acidosis, unspecified: Secondary | ICD-10-CM | POA: Diagnosis present

## 2022-11-30 DIAGNOSIS — Z5329 Procedure and treatment not carried out because of patient's decision for other reasons: Secondary | ICD-10-CM | POA: Diagnosis not present

## 2022-11-30 DIAGNOSIS — N179 Acute kidney failure, unspecified: Secondary | ICD-10-CM | POA: Diagnosis present

## 2022-11-30 DIAGNOSIS — Z961 Presence of intraocular lens: Secondary | ICD-10-CM | POA: Diagnosis present

## 2022-11-30 DIAGNOSIS — I2510 Atherosclerotic heart disease of native coronary artery without angina pectoris: Secondary | ICD-10-CM | POA: Diagnosis present

## 2022-11-30 DIAGNOSIS — Z8619 Personal history of other infectious and parasitic diseases: Secondary | ICD-10-CM

## 2022-11-30 DIAGNOSIS — C8330 Diffuse large B-cell lymphoma, unspecified site: Secondary | ICD-10-CM | POA: Diagnosis present

## 2022-11-30 DIAGNOSIS — G8929 Other chronic pain: Secondary | ICD-10-CM | POA: Diagnosis present

## 2022-11-30 DIAGNOSIS — I5023 Acute on chronic systolic (congestive) heart failure: Secondary | ICD-10-CM | POA: Diagnosis present

## 2022-11-30 DIAGNOSIS — R079 Chest pain, unspecified: Secondary | ICD-10-CM | POA: Diagnosis not present

## 2022-11-30 DIAGNOSIS — I10 Essential (primary) hypertension: Secondary | ICD-10-CM | POA: Diagnosis not present

## 2022-11-30 DIAGNOSIS — I5021 Acute systolic (congestive) heart failure: Secondary | ICD-10-CM | POA: Diagnosis present

## 2022-11-30 DIAGNOSIS — I255 Ischemic cardiomyopathy: Secondary | ICD-10-CM | POA: Diagnosis present

## 2022-11-30 DIAGNOSIS — Z7902 Long term (current) use of antithrombotics/antiplatelets: Secondary | ICD-10-CM

## 2022-11-30 DIAGNOSIS — Z79899 Other long term (current) drug therapy: Secondary | ICD-10-CM

## 2022-11-30 DIAGNOSIS — I509 Heart failure, unspecified: Secondary | ICD-10-CM

## 2022-11-30 DIAGNOSIS — I160 Hypertensive urgency: Secondary | ICD-10-CM | POA: Diagnosis present

## 2022-11-30 DIAGNOSIS — Z1152 Encounter for screening for COVID-19: Secondary | ICD-10-CM

## 2022-11-30 DIAGNOSIS — Z7982 Long term (current) use of aspirin: Secondary | ICD-10-CM

## 2022-11-30 DIAGNOSIS — R918 Other nonspecific abnormal finding of lung field: Secondary | ICD-10-CM | POA: Diagnosis not present

## 2022-11-30 DIAGNOSIS — Z9842 Cataract extraction status, left eye: Secondary | ICD-10-CM | POA: Diagnosis not present

## 2022-11-30 DIAGNOSIS — R609 Edema, unspecified: Secondary | ICD-10-CM | POA: Diagnosis not present

## 2022-11-30 DIAGNOSIS — R0600 Dyspnea, unspecified: Secondary | ICD-10-CM | POA: Diagnosis not present

## 2022-11-30 DIAGNOSIS — R0602 Shortness of breath: Secondary | ICD-10-CM | POA: Diagnosis present

## 2022-11-30 DIAGNOSIS — I739 Peripheral vascular disease, unspecified: Secondary | ICD-10-CM | POA: Diagnosis present

## 2022-11-30 DIAGNOSIS — Z90710 Acquired absence of both cervix and uterus: Secondary | ICD-10-CM

## 2022-11-30 DIAGNOSIS — N200 Calculus of kidney: Secondary | ICD-10-CM | POA: Diagnosis not present

## 2022-11-30 DIAGNOSIS — I517 Cardiomegaly: Secondary | ICD-10-CM | POA: Diagnosis not present

## 2022-11-30 DIAGNOSIS — I252 Old myocardial infarction: Secondary | ICD-10-CM | POA: Diagnosis not present

## 2022-11-30 DIAGNOSIS — I130 Hypertensive heart and chronic kidney disease with heart failure and stage 1 through stage 4 chronic kidney disease, or unspecified chronic kidney disease: Secondary | ICD-10-CM | POA: Diagnosis present

## 2022-11-30 DIAGNOSIS — M545 Low back pain, unspecified: Secondary | ICD-10-CM | POA: Diagnosis present

## 2022-11-30 DIAGNOSIS — Z955 Presence of coronary angioplasty implant and graft: Secondary | ICD-10-CM

## 2022-11-30 DIAGNOSIS — Z9841 Cataract extraction status, right eye: Secondary | ICD-10-CM | POA: Diagnosis not present

## 2022-11-30 DIAGNOSIS — N1832 Chronic kidney disease, stage 3b: Secondary | ICD-10-CM | POA: Diagnosis present

## 2022-11-30 DIAGNOSIS — I5043 Acute on chronic combined systolic (congestive) and diastolic (congestive) heart failure: Secondary | ICD-10-CM | POA: Diagnosis not present

## 2022-11-30 DIAGNOSIS — I4891 Unspecified atrial fibrillation: Secondary | ICD-10-CM | POA: Diagnosis not present

## 2022-11-30 DIAGNOSIS — N281 Cyst of kidney, acquired: Secondary | ICD-10-CM | POA: Diagnosis not present

## 2022-11-30 DIAGNOSIS — I5031 Acute diastolic (congestive) heart failure: Secondary | ICD-10-CM | POA: Diagnosis not present

## 2022-11-30 DIAGNOSIS — F17210 Nicotine dependence, cigarettes, uncomplicated: Secondary | ICD-10-CM | POA: Diagnosis present

## 2022-11-30 DIAGNOSIS — I110 Hypertensive heart disease with heart failure: Secondary | ICD-10-CM | POA: Diagnosis not present

## 2022-11-30 NOTE — ED Triage Notes (Signed)
Pt to ED via [facility] from home with c/o increased SOB and fluid build up in feet and ankles. Per EMS symptoms ongoing x 1 week, pt supposed to be taking 20mg  lasix however patient is noncompliant with medications. Per EMS  hx of CHF.   53f R hand 170/100 97% R hand CO2 18 RR 16 A-fib on the monitor

## 2022-12-01 ENCOUNTER — Emergency Department: Payer: Medicare PPO

## 2022-12-01 ENCOUNTER — Observation Stay (HOSPITAL_COMMUNITY)
Admit: 2022-12-01 | Discharge: 2022-12-01 | Disposition: A | Discharge status: Home / Self Care | Payer: Medicare PPO | Attending: Internal Medicine | Admitting: Internal Medicine

## 2022-12-01 DIAGNOSIS — Z9841 Cataract extraction status, right eye: Secondary | ICD-10-CM | POA: Diagnosis not present

## 2022-12-01 DIAGNOSIS — Z5329 Procedure and treatment not carried out because of patient's decision for other reasons: Secondary | ICD-10-CM | POA: Diagnosis not present

## 2022-12-01 DIAGNOSIS — Z961 Presence of intraocular lens: Secondary | ICD-10-CM | POA: Diagnosis present

## 2022-12-01 DIAGNOSIS — N179 Acute kidney failure, unspecified: Secondary | ICD-10-CM | POA: Diagnosis present

## 2022-12-01 DIAGNOSIS — I509 Heart failure, unspecified: Secondary | ICD-10-CM

## 2022-12-01 DIAGNOSIS — I5021 Acute systolic (congestive) heart failure: Secondary | ICD-10-CM | POA: Diagnosis present

## 2022-12-01 DIAGNOSIS — E872 Acidosis, unspecified: Secondary | ICD-10-CM | POA: Diagnosis present

## 2022-12-01 DIAGNOSIS — Z7982 Long term (current) use of aspirin: Secondary | ICD-10-CM | POA: Diagnosis not present

## 2022-12-01 DIAGNOSIS — I2510 Atherosclerotic heart disease of native coronary artery without angina pectoris: Secondary | ICD-10-CM | POA: Diagnosis present

## 2022-12-01 DIAGNOSIS — I5043 Acute on chronic combined systolic (congestive) and diastolic (congestive) heart failure: Secondary | ICD-10-CM | POA: Diagnosis not present

## 2022-12-01 DIAGNOSIS — I5023 Acute on chronic systolic (congestive) heart failure: Secondary | ICD-10-CM | POA: Diagnosis present

## 2022-12-01 DIAGNOSIS — I739 Peripheral vascular disease, unspecified: Secondary | ICD-10-CM | POA: Diagnosis present

## 2022-12-01 DIAGNOSIS — I255 Ischemic cardiomyopathy: Secondary | ICD-10-CM | POA: Diagnosis present

## 2022-12-01 DIAGNOSIS — Z90710 Acquired absence of both cervix and uterus: Secondary | ICD-10-CM | POA: Diagnosis not present

## 2022-12-01 DIAGNOSIS — Z1152 Encounter for screening for COVID-19: Secondary | ICD-10-CM | POA: Diagnosis not present

## 2022-12-01 DIAGNOSIS — F17210 Nicotine dependence, cigarettes, uncomplicated: Secondary | ICD-10-CM | POA: Diagnosis present

## 2022-12-01 DIAGNOSIS — Z9842 Cataract extraction status, left eye: Secondary | ICD-10-CM | POA: Diagnosis not present

## 2022-12-01 DIAGNOSIS — R0602 Shortness of breath: Secondary | ICD-10-CM | POA: Diagnosis present

## 2022-12-01 DIAGNOSIS — N1832 Chronic kidney disease, stage 3b: Secondary | ICD-10-CM | POA: Diagnosis present

## 2022-12-01 DIAGNOSIS — C8330 Diffuse large B-cell lymphoma, unspecified site: Secondary | ICD-10-CM | POA: Diagnosis present

## 2022-12-01 DIAGNOSIS — Z8619 Personal history of other infectious and parasitic diseases: Secondary | ICD-10-CM | POA: Diagnosis not present

## 2022-12-01 DIAGNOSIS — Z955 Presence of coronary angioplasty implant and graft: Secondary | ICD-10-CM | POA: Diagnosis not present

## 2022-12-01 DIAGNOSIS — I130 Hypertensive heart and chronic kidney disease with heart failure and stage 1 through stage 4 chronic kidney disease, or unspecified chronic kidney disease: Secondary | ICD-10-CM | POA: Diagnosis present

## 2022-12-01 DIAGNOSIS — I5031 Acute diastolic (congestive) heart failure: Secondary | ICD-10-CM | POA: Diagnosis not present

## 2022-12-01 DIAGNOSIS — I252 Old myocardial infarction: Secondary | ICD-10-CM | POA: Diagnosis not present

## 2022-12-01 DIAGNOSIS — G8929 Other chronic pain: Secondary | ICD-10-CM | POA: Diagnosis present

## 2022-12-01 DIAGNOSIS — M545 Low back pain, unspecified: Secondary | ICD-10-CM | POA: Diagnosis present

## 2022-12-01 DIAGNOSIS — Z7902 Long term (current) use of antithrombotics/antiplatelets: Secondary | ICD-10-CM | POA: Diagnosis not present

## 2022-12-01 DIAGNOSIS — I160 Hypertensive urgency: Secondary | ICD-10-CM | POA: Diagnosis present

## 2022-12-01 LAB — ECHOCARDIOGRAM COMPLETE
AR max vel: 1.96 cm2
AV Area VTI: 1.69 cm2
AV Area mean vel: 1.79 cm2
AV Mean grad: 2 mmHg
AV Peak grad: 4.4 mmHg
Ao pk vel: 1.05 m/s
Area-P 1/2: 4.8 cm2
Height: 63 in
MV M vel: 4.56 m/s
MV Peak grad: 83.2 mmHg
S' Lateral: 4.1 cm
Weight: 2035.29 oz

## 2022-12-01 LAB — RESP PANEL BY RT-PCR (FLU A&B, COVID) ARPGX2
Influenza A by PCR: NEGATIVE
Influenza B by PCR: NEGATIVE
SARS Coronavirus 2 by RT PCR: NEGATIVE

## 2022-12-01 LAB — BASIC METABOLIC PANEL
Anion gap: 14 (ref 5–15)
BUN: 51 mg/dL — ABNORMAL HIGH (ref 8–23)
CO2: 14 mmol/L — ABNORMAL LOW (ref 22–32)
Calcium: 8.3 mg/dL — ABNORMAL LOW (ref 8.9–10.3)
Chloride: 112 mmol/L — ABNORMAL HIGH (ref 98–111)
Creatinine, Ser: 2.2 mg/dL — ABNORMAL HIGH (ref 0.44–1.00)
GFR, Estimated: 23 mL/min — ABNORMAL LOW (ref >60)
Glucose, Bld: 110 mg/dL — ABNORMAL HIGH (ref 70–99)
Potassium: 3.6 mmol/L (ref 3.5–5.1)
Sodium: 140 mmol/L (ref 135–145)

## 2022-12-01 LAB — CBC
HCT: 34.8 % — ABNORMAL LOW (ref 36.0–46.0)
Hemoglobin: 11.1 g/dL — ABNORMAL LOW (ref 12.0–15.0)
MCH: 29.9 pg (ref 26.0–34.0)
MCHC: 31.9 g/dL (ref 30.0–36.0)
MCV: 93.8 fL (ref 80.0–100.0)
Platelets: 133 10*3/uL — ABNORMAL LOW (ref 150–400)
RBC: 3.71 MIL/uL — ABNORMAL LOW (ref 3.87–5.11)
RDW: 16.6 % — ABNORMAL HIGH (ref 11.5–15.5)
WBC: 6 10*3/uL (ref 4.0–10.5)
nRBC: 0 % (ref 0.0–0.2)

## 2022-12-01 LAB — TROPONIN I (HIGH SENSITIVITY)
Troponin I (High Sensitivity): 80 ng/L — ABNORMAL HIGH (ref <18)
Troponin I (High Sensitivity): 82 ng/L — ABNORMAL HIGH (ref <18)

## 2022-12-01 LAB — PROCALCITONIN: Procalcitonin: 0.12 ng/mL

## 2022-12-01 LAB — BRAIN NATRIURETIC PEPTIDE: B Natriuretic Peptide: >4500 pg/mL — ABNORMAL HIGH (ref 0.0–100.0)

## 2022-12-01 MED ORDER — NITROGLYCERIN 0.4 MG SL SUBL: 0.4000 mg | SUBLINGUAL_TABLET | Freq: Every day | SUBLINGUAL | Status: DC | PRN

## 2022-12-01 MED ORDER — TRAMADOL HCL 50 MG PO TABS
50.0000 mg | ORAL_TABLET | Freq: Four times a day (QID) | ORAL | Status: DC | PRN
  Administered 2022-12-01 – 2022-12-03 (×3): 50 mg via ORAL
  Filled 2022-12-01 (×3): qty 1

## 2022-12-01 MED ORDER — METOPROLOL SUCCINATE ER 25 MG PO TB24
25.0000 mg | ORAL_TABLET | Freq: Every day | ORAL | Status: DC
  Administered 2022-12-01 – 2022-12-03 (×3): 25 mg via ORAL
  Filled 2022-12-01 (×3): qty 1

## 2022-12-01 MED ORDER — ACETAMINOPHEN 650 MG RE SUPP: 650.0000 mg | Freq: Four times a day (QID) | RECTAL | Status: DC | PRN

## 2022-12-01 MED ORDER — ONDANSETRON HCL 4 MG/2ML IJ SOLN
4.0000 mg | Freq: Four times a day (QID) | INTRAMUSCULAR | Status: DC | PRN
  Administered 2022-12-02: 4 mg via INTRAVENOUS
  Filled 2022-12-01: qty 2

## 2022-12-01 MED ORDER — ASPIRIN 81 MG PO TBEC
81.0000 mg | DELAYED_RELEASE_TABLET | Freq: Every day | ORAL | Status: DC
  Administered 2022-12-01 – 2022-12-03 (×3): 81 mg via ORAL
  Filled 2022-12-01 (×3): qty 1

## 2022-12-01 MED ORDER — ENOXAPARIN SODIUM 30 MG/0.3ML IJ SOSY
30.0000 mg | PREFILLED_SYRINGE | INTRAMUSCULAR | Status: DC
  Administered 2022-12-01 – 2022-12-03 (×3): 30 mg via SUBCUTANEOUS
  Filled 2022-12-01 (×3): qty 0.3

## 2022-12-01 MED ORDER — ATORVASTATIN CALCIUM 80 MG PO TABS
80.0000 mg | ORAL_TABLET | Freq: Every day | ORAL | Status: DC
  Administered 2022-12-01 – 2022-12-03 (×3): 80 mg via ORAL
  Filled 2022-12-01 (×3): qty 1

## 2022-12-01 MED ORDER — LIDOCAINE 5 % EX PTCH
1.0000 | MEDICATED_PATCH | CUTANEOUS | Status: DC
  Administered 2022-12-01 – 2022-12-03 (×3): 1 via TRANSDERMAL
  Filled 2022-12-01 (×3): qty 1

## 2022-12-01 MED ORDER — FUROSEMIDE 10 MG/ML IJ SOLN
40.0000 mg | Freq: Once | INTRAMUSCULAR | Status: AC
  Administered 2022-12-01: 40 mg via INTRAVENOUS
  Filled 2022-12-01: qty 4

## 2022-12-01 MED ORDER — NICOTINE 21 MG/24HR TD PT24
21.0000 mg | MEDICATED_PATCH | Freq: Every day | TRANSDERMAL | Status: DC
  Administered 2022-12-01 – 2022-12-03 (×3): 21 mg via TRANSDERMAL
  Filled 2022-12-01 (×3): qty 1

## 2022-12-01 MED ORDER — FUROSEMIDE 10 MG/ML IJ SOLN
40.0000 mg | Freq: Two times a day (BID) | INTRAMUSCULAR | Status: DC
  Administered 2022-12-01 – 2022-12-02 (×4): 40 mg via INTRAVENOUS
  Filled 2022-12-01 (×4): qty 4

## 2022-12-01 MED ORDER — MAGNESIUM OXIDE -MG SUPPLEMENT 400 (240 MG) MG PO TABS
400.0000 mg | ORAL_TABLET | Freq: Once | ORAL | Status: AC
  Administered 2022-12-01: 400 mg via ORAL
  Filled 2022-12-01: qty 1

## 2022-12-01 MED ORDER — ACETAMINOPHEN 325 MG PO TABS
650.0000 mg | ORAL_TABLET | Freq: Four times a day (QID) | ORAL | Status: DC | PRN
  Administered 2022-12-01 – 2022-12-02 (×3): 650 mg via ORAL
  Filled 2022-12-01 (×3): qty 2

## 2022-12-01 MED ORDER — ONDANSETRON HCL 4 MG PO TABS
4.0000 mg | ORAL_TABLET | Freq: Four times a day (QID) | ORAL | Status: DC | PRN
  Administered 2022-12-02: 4 mg via ORAL
  Filled 2022-12-01: qty 1

## 2022-12-01 MED ORDER — ORAL CARE MOUTH RINSE: 15.0000 mL | OROMUCOSAL | Status: DC | PRN

## 2022-12-01 NOTE — H&P (Signed)
History and Physical    Patient: Ashley Lynch UJW:[id number] DOB: February 08, 1946 DOA: 11/30/2022 DOS: the patient was seen and examined on 12/01/2022 PCP: Margaretann Loveless, MD  Patient coming from: Home  Chief Complaint:  Chief Complaint  Patient presents with   Shortness of Breath         HPI: Ashley Lynch is a 77 y.o. 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  She denies chest pain, cough, fever or chills.  Has been having difficulty getting her medication. ED course and data review: BP in the 160s to 170s with tachypnea in the low 20s with otherwise normal vitals>. Labs: Troponin 82 and BNP> 4500 CBC unremarkable except for mild anemia BMP with creatinine of 2.20, above baseline of 1.58 in  12/2021 with bicarb of 14 EKG, personally viewed and interpreted showing sinus arrhythmia at 89 with LAFB and T wave inversion in lateral leads. Chest x-ray consistent with CHF possible atypical pneumonia as further detailed below: IMPRESSION: Cardiomegaly with suspected interstitial edema and small right pleural effusion. Multifocal pneumonia is also possible but not favored.  Patient treated with a dose of IV Lasix 40 mg Hospitalist consulted for admission.   Review of Systems: As mentioned in the history of present illness. All other systems reviewed and are negative.  Past Medical History:  Diagnosis Date   Cancer (HCC)    lymphoma   Cardiomyopathy (HCC)    Complication of anesthesia    slow to wake   H/O heart artery stent 12/2019   X2   HFrEF (heart failure with reduced ejection fraction) (HCC)    History of chicken pox    History of measles, mumps, or rubella    HTN  (hypertension)    Iliac artery occlusion (HCC)    Bilateral   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)    Myocardial infarction (HCC) 04/23/2021   NSTEMI   PAD (peripheral artery disease) [facility])    Past Surgical History:  Procedure Laterality Date   ABDOMINAL HYSTERECTOMY  04/18/2011   CARDIAC CATHETERIZATION  04/25/2021   CATARACT EXTRACTION W/PHACO Left 09/20/2021   Procedure: CATARACT EXTRACTION PHACO AND INTRAOCULAR LENS PLACEMENT (IOC) LEFT  18.07 01:25.3;  Surgeon: Galen Manila, MD;  Location: [facility];  Service: Ophthalmology;  Laterality: Left;   CATARACT EXTRACTION W/PHACO Right 10/04/2021   Procedure: CATARACT EXTRACTION PHACO AND INTRAOCULAR LENS PLACEMENT (IOC) RIGHT  5.68 00:44.8;  Surgeon: Galen Manila, MD;  Location: [facility];  Service: Ophthalmology;  Laterality: Right;   GALLBLADDER SURGERY  1975 1981   LEFT HEART CATH AND CORONARY ANGIOGRAPHY N/A 01/13/2020   Procedure: LEFT HEART CATH AND CORONARY ANGIOGRAPHY;  Surgeon: Laurier Nancy, MD;  Location: [facility] IN[facility];  Service: Cardiovascular;  Laterality: N/A;   TONSILLECTOMY  04/17/2008   Social History:  reports that she has been smoking cigarettes. She has a 60 pack-year smoking history. She has never used smokeless tobacco. She reports that she does not drink alcohol and does not use drugs.  No Known Allergies  Family History  Problem Relation Age of Onset   Diabetes Mother        sibling and grandparent also    Prior to Admission medications   Medication Sig Start Date  End Date Taking? Authorizing Provider  aspirin 81 MG tablet Take 81 mg by mouth daily. Patient not taking: Reported on 09/19/2021    [provider]  atorvastatin (LIPITOR) 80 MG tablet Take 80 mg by mouth. Patient not taking: Reported on 09/19/2021    [provider]  clopidogrel (PLAVIX) 75 MG tablet Take 1 tablet (75 mg total) by mouth daily. 12/22/21   Enedina Finner, MD  furosemide (LASIX) 20 MG tablet  TAKE 1 TABLET BY MOUTH EVERY DAY 06/01/22   Margaretann Loveless, MD  GEMTESA 75 MG TABS Take 75 mg by mouth daily. 09/20/21   [provider]  metoprolol succinate (TOPROL-XL) 25 MG 24 hr tablet Take 1 tablet (25 mg total) by mouth daily. 12/22/21   Enedina Finner, MD  metoprolol succinate (TOPROL-XL) 25 MG 24 hr tablet Take 1 tablet (25 mg total) by mouth daily. 12/22/21   Enedina Finner, MD  Multiple Vitamin (MULTIVITAMIN WITH MINERALS) TABS tablet Take 1 tablet by mouth daily. 12/22/21   Enedina Finner, MD  NICODERM CQ 14 MG/24HR patch Place 1 patch onto the skin daily. 08/24/21   [provider]  nitroGLYCERIN (NITROSTAT) 0.4 MG SL tablet Place 0.4 tablets under the tongue daily as needed. 08/24/21   [provider]  omeprazole (PRILOSEC) 20 MG capsule Take 1 capsule (20 mg total) by mouth daily. 11/27/16   Karamalegos, Netta Neat, DO  sacubitril-valsartan (ENTRESTO) 24-26 MG Take 1 tablet by mouth 2 (two) times daily. 12/22/21   Enedina Finner, MD  sertraline (ZOLOFT) 50 MG tablet TAKE 1 TABLET BY MOUTH EVERY DAY 06/30/22   Margaretann Loveless, MD  traMADol (ULTRAM) 50 MG tablet Take 1 tablet (50 mg total) by mouth 3 (three) times daily as needed. 11/04/22   Cameron Ali, PA-C    Physical Exam: Vitals:   12/01/22 0100 12/01/22 0130 12/01/22 0200 12/01/22 0206  BP: (!) 171/102 (!) 168/101 (!) 166/84   Pulse: 85 66 85 86  Resp: 15 (!) 43 (!) 22 20  Temp:    97.7 F (36.5 C)  SpO2: 99% 98% 96% 96%  Weight:      Height:       Physical Exam Vitals and nursing note reviewed.  Constitutional:      General: She is not in acute distress.    Comments: Conversational dyspnea.  Sitting with head of bed elevated  HENT:     Head: Normocephalic and atraumatic.  Cardiovascular:     Rate and Rhythm: Normal rate and regular rhythm.     Heart sounds: Normal heart sounds.  Pulmonary:     Effort: Tachypnea present.     Breath sounds: Wheezing present.  Abdominal:     Palpations: Abdomen is soft.      Tenderness: There is no abdominal tenderness.  Musculoskeletal:     Right lower leg: Edema present.     Left lower leg: Edema present.  Neurological:     Mental Status: Mental status is at baseline.     Labs on Admission: I have personally reviewed following labs and imaging studies  CBC: Recent Labs  Lab 12/01/22 0050  WBC 6.0  HGB 11.1*  HCT 34.8*  MCV 93.8  PLT 133*   Basic Metabolic Panel: Recent Labs  Lab 12/01/22 0050  NA 140  K 3.6  CL 112*  CO2 14*  GLUCOSE 110*  BUN 51*  CREATININE 2.20*  CALCIUM 8.3*   GFR: Estimated Creatinine Clearance: 18 mL/min (A) (by C-G formula  based on SCr of 2.2 mg/dL (H)). Liver Function Tests: No results for input(s): "AST", "ALT", "ALKPHOS", "BILITOT", "PROT", "ALBUMIN" in the last 168 hours. No results for input(s): "LIPASE", "AMYLASE" in the last 168 hours. No results for input(s): "AMMONIA" in the last 168 hours. Coagulation Profile: No results for input(s): "INR", "PROTIME" in the last 168 hours. Cardiac Enzymes: No results for input(s): "CKTOTAL", "CKMB", "CKMBINDEX", "TROPONINI" in the last 168 hours. BNP (last 3 results) No results for input(s): "PROBNP" in the last 8760 hours. HbA1C: No results for input(s): "HGBA1C" in the last 72 hours. CBG: No results for input(s): "GLUCAP" in the last 168 hours. Lipid Profile: No results for input(s): "CHOL", "HDL", "LDLCALC", "TRIG", "CHOLHDL", "LDLDIRECT" in the last 72 hours. Thyroid Function Tests: No results for input(s): "TSH", "T4TOTAL", "FREET4", "T3FREE", "THYROIDAB" in the last 72 hours. Anemia Panel: No results for input(s): "VITAMINB12", "FOLATE", "FERRITIN", "TIBC", "IRON", "RETICCTPCT" in the last 72 hours. Urine analysis:    Component Value Date/Time   COLORURINE YELLOW (A) 12/19/2021 1034   APPEARANCEUR HAZY (A) 12/19/2021 1034   LABSPEC 1.020 12/19/2021 1034   PHURINE 5.0 12/19/2021 1034   GLUCOSEU NEGATIVE 12/19/2021 1034   HGBUR MODERATE (A)  12/19/2021 1034   BILIRUBINUR NEGATIVE 12/19/2021 1034   KETONESUR NEGATIVE 12/19/2021 1034   PROTEINUR 100 (A) 12/19/2021 1034   NITRITE NEGATIVE 12/19/2021 1034   LEUKOCYTESUR TRACE (A) 12/19/2021 1034    Radiological Exams on Admission: DG Chest 2 View  Result Date: 12/01/2022 CLINICAL DATA:  Chest pain, shortness of breath EXAM: CHEST - 2 VIEW COMPARISON:  12/19/2021 FINDINGS: Patchy/interstitial opacities in the lungs bilaterally. Asymmetric interstitial edema is favored, although multifocal pneumonia is technically possible. Suspected small right pleural effusion. No pneumothorax. Cardiomegaly. Visualized osseous structures are within normal limits. IMPRESSION: Cardiomegaly with suspected interstitial edema and small right pleural effusion. Multifocal pneumonia is also possible but not favored. Electronically Signed   By: Charline Bills M.D.   On: 12/01/2022 00:43     Data Reviewed: Relevant notes from primary care and specialist visits, past discharge summaries as available in EHR, including Care Everywhere. Prior diagnostic testing as pertinent to current admission diagnoses Updated medications and problem lists for reconciliation ED course, including vitals, labs, imaging, treatment and response to treatment Triage notes, nursing and pharmacy notes and ED provider's notes Notable results as noted in HPI   Assessment and Plan: Acute on chronic systolic CHF (congestive heart failure) (HCC) Hypertensive urgency/emergency Ischemic cardiomyopathy (EF 25 to 30% 12/2021) CAD s/p PCI Patient endorses different barriers to not getting her medication Denies chest pain.  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 Observation status is judged to be reasonable and necessary in order to  provide the required intensity of service to ensure the patient's safety. The patient's presenting symptoms, physical exam findings, and initial radiographic and laboratory data in the context of their medical condition is felt to place them at decreased risk for further clinical deterioration. Furthermore, it is anticipated that the patient will be medically stable for discharge from the hospital within 2 midnights of admission.   Author: Andris Baumann, MD 12/01/2022 3:11 AM  For on call review [email or web address].

## 2022-12-01 NOTE — Plan of Care (Signed)

## 2022-12-01 NOTE — Progress Notes (Signed)
*  PRELIMINARY RESULTS* Echocardiogram 2D Echocardiogram has been performed.  Laddie Aquas 12/01/2022, 1:48 PM

## 2022-12-01 NOTE — ED Notes (Signed)
Inpt MD at bedside

## 2022-12-01 NOTE — Assessment & Plan Note (Signed)
Resume metoprolol ?

## 2022-12-01 NOTE — Assessment & Plan Note (Addendum)
Metabolic acidosis creatinine of 2.20, above baseline of 1.58 in  12/2021 with bicarb of 14 Monitor renal function in the setting of IV diuretic treatment Consider renal consult if worsening

## 2022-12-01 NOTE — ED Provider Notes (Signed)
Patient reporting chronic lower back pain.  Typically works to place a pain patch on the back and that is quite helpful at the house.  Will order   Sharyn Creamer, MD 12/01/22 [phone]

## 2022-12-01 NOTE — Progress Notes (Signed)
Anticoagulation monitoring(Lovenox):  77 yo female ordered Lovenox 40 mg Q24h    Filed Weights   11/30/22 2344  Weight: 60.1 kg (132 lb 6.4 oz)   BMI 23.5   Lab Results  Component Value Date   CREATININE 2.20 (H) 12/01/2022   CREATININE 1.84 (H) 12/22/2021   CREATININE 1.64 (H) 12/21/2021   Estimated Creatinine Clearance: 18 mL/min (A) (by C-G formula based on SCr of 2.2 mg/dL (H)). Hemoglobin & Hematocrit     Component Value Date/Time   HGB 11.1 (L) 12/01/2022 0050   HGB 12.0 06/23/2015 0910   HCT 34.8 (L) 12/01/2022 0050   HCT 35.7 06/23/2015 0910     Per Protocol for Patient with estCrcl < 30 ml/min and BMI < 30, will transition to Lovenox 30 mg Q24h.

## 2022-12-01 NOTE — Progress Notes (Signed)
  PROGRESS NOTE    Ashley Lynch  FAO:[id number] DOB: Mar 09, 1946 DOA: 11/30/2022 PCP: Margaretann Loveless, MD  256A/256A-AA  LOS: 0 days   Brief hospital course:   Assessment & Plan: Ashley Lynch is a 77 y.o. 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  She denies chest pain, cough, fever or chills.     Acute on chronic systolic CHF (congestive heart failure) (HCC) Hypertensive urgency/emergency Ischemic cardiomyopathy (EF 25 to 30% 12/2021) CAD s/p PCI Denies chest pain.  Troponin 82 and BNP over 4500.  CXR showed Patchy/interstitial opacities in the lungs bilaterally, likely interstitial edema. --Patient reported to admitter different barriers to not getting her medication, however, today pt said she does not have problem getting her Rx filled. 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  Did have increased urine  output in response to IV lasix.   Objective: Vitals:   12/01/22 0509 12/01/22 0816 12/01/22 1231 12/01/22 1526  BP: (!) 155/86 (!) 155/82 (!) 153/81 (!) 149/91  Pulse: 87 74 73 81  Resp: 19 16 19 19   Temp: 98.3 F (36.8 C) (!) 97.5 F (36.4 C) 97.9 F (36.6 C) 97.7 F (36.5 C)  TempSrc: Oral  Oral Oral  SpO2: 99% 98% 97% 98%  Weight:      Height:        Intake/Output Summary (Last 24 hours) at 12/01/2022 1712 Last data filed at 12/01/2022 1456 Gross per 24 hour  Intake 470 ml  Output 100 ml  Net 370 ml   Filed Weights   11/30/22 2344 12/01/22 0501  Weight: 60.1 kg 57.7 kg    Examination:   Constitutional: NAD, AAOx3 HEENT: conjunctivae and lids normal, EOMI CV: No cyanosis.   RESP: normal respiratory effort, on RA Neuro: II - XII grossly intact.   Psych: depressed mood and affect.  Appropriate judgement and reason   Data Reviewed: I have personally reviewed labs and imaging studies  No bill note.  Darlin Priestly, MD [facility] If 7PM-7AM, please contact night-coverage 12/01/2022, 5:12 PM

## 2022-12-01 NOTE — Progress Notes (Addendum)
[facility] HF Stewardship  PCP: Margaretann Loveless, MD  PCP-Cardiologist: None  HPI: Ashley Lynch is a 77 y.o. 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 Hypertensive on admission. Troponin 82 and BNP elevated >4500. Admitted with AKI, creatinine 2.2 (baseline ~1.6). Most recent TTE on 12/20/2021 showed LVEF of 25-30% with grade I diastolic dysfunction and moderate mitral valve regurgitation.   Pertinent Lab Values: BUN  Date Value Ref Range Status  12/01/2022 51 (H) 8 - 23 mg/dL Final  09/81/1914 17 8 - 27 mg/dL Final  78/29/5621 15 7 - 18 mg/dL Final   Potassium  Date Value Ref Range Status  12/01/2022 3.6 3.5 - 5.1 mmol/L Final  12/19/2013 3.9 3.5 - 5.1 mmol/L Final   Sodium  Date Value Ref Range Status  12/01/2022 140 135 - 145 mmol/L Final  06/23/2015 136 134 - 144 mmol/L Final  12/19/2013 139 136 - 145 mmol/L Final   B Natriuretic Peptide  Date Value Ref Range Status  12/01/2022 >4,500.0 (H) 0.0 - 100.0 pg/mL Final    Comment:    Performed at [facility], [address] [place], [place] [address]   Magnesium  Date Value Ref Range Status  12/22/2021 2.3 1.7 - 2.4 mg/dL Final    Comment:    Performed at [facility], [address] [place], [place] [address]   LDH  Date Value Ref Range Status  01/14/2016 146 98 - 192 U/L Final    Vital Signs: Temp:  [97.5 F (36.4 C)-98.3 F (36.8 C)] 97.5 F (36.4 C) (08/16 0816) Pulse Rate:  [55-99] 74 (08/16 0816) Cardiac Rhythm: Normal sinus rhythm (08/16 0510) Resp:  [15-43] 16 (08/16 0816) BP: (140-174)/(82-108) 155/82 (08/16 0816) SpO2:  [95 %-100 %] 98 % (08/16 0816) Weight:  [57.7 kg (127 lb 3.3 oz)-60.1 kg (132 lb 6.4 oz)] 57.7 kg (127 lb 3.3 oz)  (08/16 0501)   Intake/Output Summary (Last 24 hours) at 12/01/2022 1213 Last data filed at 12/01/2022 1055 Gross per 24 hour  Intake 200 ml  Output --  Net 200 ml    Current Inpatient Medications:  Loop Diuretic: Furosemide 40 mg IV BID Beta Blocker: Metoprolol succinate 25 mg daily  Prior to admission HF Medications:  Loop Diuretic: Lasix 20 mg daily Beta Blocker: Metoprolol succinate 25 mg daily Angiotensin Receptor/Neprilysin Inhibitor (ARNI): Entresto 24-26 mg BID  Assessment: 1. Acute on chronic systolic heart failure (LVEF 30-35%), due to ICM. NYHA class IV symptoms.  - Shortness of breath present at rest. Moderate LEE present on exam. Markedly volume overloaded.  - AKI likely due to volume overload. Creatinine 2.2 today, baseline is ~1.6.  - No urine output documented. Pt reports frequent urination. Weight down 5 lbs from admission, though unsure of bed vs standing weight used. - Blood pressure markedly elevated from 150-170s. HR stable from 70-90s. Caution metoprolol use given marked volume overload. Consider hydralazine while AKI present for afterload reduction until able to tolerate ARB.  Plan: 1) Medication changes recommended at this time: -Consider hydralazine 10 mg PO TID -Consider potassium 20 meq PO x 1  2) Patient assistance:  -Excellent insurance coverage.  3) Education: -- Patient has been educated on current HF medications and potential additions to HF medication regimen - Patient verbalizes understanding that over  the next few months, these medication doses may change and more medications may be added to optimize HF regimen - Patient has been educated on basic disease state pathophysiology and goals of therapy  Medication Assistance / Insurance Benefits Check:  Does the patient have prescription insurance? Prescription Insurance: Medicare (And Tricare)   Outpatient Pharmacy:  Prior to admission outpatient pharmacy: [facility]  Thank you for involving  pharmacy in this patient's care.  Enos Fling, PharmD, BCPS Phone - [phone] Clinical Pharmacist 12/01/2022 12:14 PM

## 2022-12-01 NOTE — Assessment & Plan Note (Signed)
Continue aspirin and atorvastatin. ?

## 2022-12-01 NOTE — ED Provider Notes (Incomplete)
[facility] Provider Note    Event Date/Time   First MD Initiated Contact with Patient 11/30/22 2344     (approximate)   History   Shortness of Breath (/)   HPI {Remember to add pertinent medical, surgical, social, and/or OB history to HPI:1} Ashley Lynch is a 77 y.o. female with a history of congestive heart failure, ischemic cardiomyopathy hypertension peripheral vascular disease coronary disease.  Reviewed admission from September patient was treated for acute heart failure with diuresis following up with Dr. Welton Flakes     Physical Exam   Triage Vital Signs: ED Triage Vitals  Encounter Vitals Group     BP 11/30/22 2346 (!) 157/101     Systolic BP Percentile --      Diastolic BP Percentile --      Pulse Rate 11/30/22 2346 82     Resp --      Temp 11/30/22 2346 97.8 F (36.6 C)     Temp src --      SpO2 11/30/22 2346 100 %     Weight 11/30/22 2344 132 lb 6.4 oz (60.1 kg)     Height 11/30/22 2344 5\' 3"  (1.6 m)     Head Circumference --      Peak Flow --      Pain Score 11/30/22 2344 0     Pain Loc --      Pain Education --      Exclude from Growth Chart --     Most recent vital signs: Vitals:   11/30/22 2346  BP: (!) 157/101  Pulse: 82  Temp: 97.8 F (36.6 C)  SpO2: 100%    {Only need to document appropriate and relevant physical exam:1} General: Awake, no distress. *** CV:  Good peripheral perfusion. *** Resp:  Normal effort. *** Abd:  No distention. *** Other:  ***   ED Results / Procedures / Treatments   Labs (all labs ordered are listed, but only abnormal results are displayed) Labs Reviewed  BASIC METABOLIC PANEL  CBC  TROPONIN I (HIGH SENSITIVITY)     EKG  ***   RADIOLOGY *** {USE THE WORD "INTERPRETED"!! You MUST document your own interpretation of imaging, as well as the fact that you reviewed the radiologist's report!:1}   PROCEDURES:  Critical Care performed: {CriticalCareYesNo:19197::"Yes, see  critical care procedure note(s)","No"}  Procedures   MEDICATIONS ORDERED IN ED: Medications - No data to display   IMPRESSION / MDM / ASSESSMENT AND PLAN / ED COURSE  I reviewed the triage vital signs and the nursing notes.                              Differential diagnosis includes, but is not limited to, ***  Patient's presentation is most consistent with {EM COPA:27473}  *** {If the patient is on the monitor, remove the brackets and asterisks on the sentence below and remember to document it as a Procedure as well. Otherwise delete the sentence below:1} {**The patient is on the cardiac monitor to evaluate for evidence of arrhythmia and/or significant heart rate changes.**} {Remember to include, when applicable, any/all of the following data: independent review of imaging independent review of labs (comment specifically on pertinent positives and negatives) review of specific prior hospitalizations, PCP/specialist notes, etc. discuss meds given and prescribed document any discussion with consultants (including hospitalists) any clinical decision tools you used and why (PECARN, NEXUS, etc.) did you consider admitting the  patient? document social determinants of health affecting patient's care (homelessness, inability to follow up in a timely fashion, etc) document any pre-existing conditions increasing risk on current visit (e.g. diabetes and HTN increasing danger of high-risk chest pain/ACS) describes what meds you gave (especially parenteral) and why any other interventions?:1}     FINAL CLINICAL IMPRESSION(S) / ED DIAGNOSES   Final diagnoses:  None     Rx / DC Orders   ED Discharge Orders     None        Note:  This document was prepared using Dragon voice recognition software and may include unintentional dictation errors.

## 2022-12-01 NOTE — ED Provider Notes (Signed)
[facility] Provider Note    Event Date/Time   First MD Initiated Contact with Patient 11/30/22 2344     (approximate)   History   Shortness of Breath (/)   HPI  Ashley Lynch is a 77 y.o. female with a history of congestive heart failure, ischemic cardiomyopathy hypertension peripheral vascular disease coronary disease.  Reviewed admission from September patient was treated for acute heart failure with diuresis following up with Dr. Welton Flakes   1 week increasing shortness of breath.  No pain.  No fever.  Symptoms worse with walking or at night  Not currently taking many of her medications.   Physical Exam   Triage Vital Signs: ED Triage Vitals  Encounter Vitals Group     BP 11/30/22 2346 (!) 157/101     Systolic BP Percentile --      Diastolic BP Percentile --      Pulse Rate 11/30/22 2346 82     Resp --      Temp 11/30/22 2346 97.8 F (36.6 C)     Temp src --      SpO2 11/30/22 2346 100 %     Weight 11/30/22 2344 132 lb 6.4 oz (60.1 kg)     Height 11/30/22 2344 5\' 3"  (1.6 m)     Head Circumference --      Peak Flow --      Pain Score 11/30/22 2344 0     Pain Loc --      Pain Education --      Exclude from Growth Chart --     Most recent vital signs: Vitals:   12/01/22 0200 12/01/22 0206  BP: (!) 166/84   Pulse: 85 86  Resp: (!) 22 20  Temp:  97.7 F (36.5 C)  SpO2: 96% 96%     General: Awake, no distress.  Mild JVD bilateral CV:  Good peripheral perfusion.  Normal tones Resp:  Mild crackles noted in the bases bilaterally.  Speaks in phrases.  No hypoxia.  No accessory muscle use Abd:  No distention.  Other:  Moderate equivalent bilateral lower extremity edema   ED Results / Procedures / Treatments   Labs (all labs ordered are listed, but only abnormal results are displayed) Labs Reviewed  BASIC METABOLIC PANEL - Abnormal; Notable for the following components:      Result Value   Chloride 112 (*)    CO2 14 (*)    Glucose,  Bld 110 (*)    BUN 51 (*)    Creatinine, Ser 2.20 (*)    Calcium 8.3 (*)    GFR, Estimated 23 (*)    All other components within normal limits  CBC - Abnormal; Notable for the following components:   RBC 3.71 (*)    Hemoglobin 11.1 (*)    HCT 34.8 (*)    RDW 16.6 (*)    Platelets 133 (*)    All other components within normal limits  BRAIN NATRIURETIC PEPTIDE - Abnormal; Notable for the following components:   B Natriuretic Peptide >4,500.0 (*)    All other components within normal limits  TROPONIN I (HIGH SENSITIVITY) - Abnormal; Notable for the following components:   Troponin I (High Sensitivity) 80 (*)    All other components within normal limits  TROPONIN I (HIGH SENSITIVITY) - Abnormal; Notable for the following components:   Troponin I (High Sensitivity) 82 (*)    All other components within normal limits  RESP PANEL BY RT-PCR (  FLU A&B, COVID) [id number]  PROCALCITONIN     EKG  EKG interpreted by me at 2340 heart rate 90 QRS 110 QTc 400 Underlying rhythm felt to be sinus rhythm with occasional sinus arrhythmia and PVC.  No evidence of acute ischemia did noted.  Somewhat difficult to exclude A-fib, but suspect sinus   RADIOLOGY  Chest x-ray interpreted by meas concerning for vascular congestion, radiologist identifies difficult to exclude infection  DG Chest 2 View  Result Date: 12/01/2022 CLINICAL DATA:  Chest pain, shortness of breath EXAM: CHEST - 2 VIEW COMPARISON:  12/19/2021 FINDINGS: Patchy/interstitial opacities in the lungs bilaterally. Asymmetric interstitial edema is favored, although multifocal pneumonia is technically possible. Suspected small right pleural effusion. No pneumothorax. Cardiomegaly. Visualized osseous structures are within normal limits. IMPRESSION: Cardiomegaly with suspected interstitial edema and small right pleural effusion. Multifocal pneumonia is also possible but not favored. Electronically Signed   By: Charline Bills M.D.   On: 12/01/2022  00:43      PROCEDURES:  Critical Care performed: No  Procedures   MEDICATIONS ORDERED IN ED: Medications  furosemide (LASIX) injection 40 mg (has no administration in time range)     IMPRESSION / MDM / ASSESSMENT AND PLAN / ED COURSE  I reviewed the triage vital signs and the nursing notes.                              Differential diagnosis includes, but is not limited to, CHF CAD, pneumonia, etc.  Patient's not having any infectious symptoms white count normal.  After reviewing x-ray have ordered procalcitonin and COVID swab, if procalcitonin is low I think reasonably can exclude pneumonia at this point.  Findings and clinical history seem most consistent with volume overload as well as her medication noncompliance to her Lasix.  She does appear volume overloaded.  Will start IV Lasix.  No pleuritic pain no chest pain.  No signs or symptoms of be highly suggestive of acute thromboembolism as causation and she carries a history of combined CHF with similar presentations in the past  Patient's presentation is most consistent with acute complicated illness / injury requiring diagnostic workup.     Consulted with and patient to be admitted under the care of Dr. Para March     FINAL CLINICAL IMPRESSION(S) / ED DIAGNOSES   Final diagnoses:  Acute on chronic combined systolic and diastolic congestive heart failure (HCC)     Rx / DC Orders   ED Discharge Orders     None        Note:  This document was prepared using Dragon voice recognition software and may include unintentional dictation errors.   Sharyn Creamer, MD 12/01/22 [phone]

## 2022-12-01 NOTE — Assessment & Plan Note (Signed)
No acute issues suspected 

## 2022-12-01 NOTE — Assessment & Plan Note (Addendum)
Hypertensive urgency/emergency Ischemic cardiomyopathy (EF 25 to 30% 12/2021) CAD s/p PCI Patient endorses different barriers to not getting her medication Denies chest pain.  Troponin 82 and BNP over 4500 IV Lasix Resume metoprolol, aspirin and atorvastatin Will hold off on Entresto Control BP Daily weights with intake and output monitoring Echocardiogram

## 2022-12-02 DIAGNOSIS — I5023 Acute on chronic systolic (congestive) heart failure: Secondary | ICD-10-CM | POA: Diagnosis not present

## 2022-12-02 LAB — BASIC METABOLIC PANEL
Anion gap: 9 (ref 5–15)
BUN: 53 mg/dL — ABNORMAL HIGH (ref 8–23)
CO2: 18 mmol/L — ABNORMAL LOW (ref 22–32)
Calcium: 8.3 mg/dL — ABNORMAL LOW (ref 8.9–10.3)
Chloride: 113 mmol/L — ABNORMAL HIGH (ref 98–111)
Creatinine, Ser: 2.47 mg/dL — ABNORMAL HIGH (ref 0.44–1.00)
GFR, Estimated: 20 mL/min — ABNORMAL LOW (ref >60)
Glucose, Bld: 96 mg/dL (ref 70–99)
Potassium: 3.3 mmol/L — ABNORMAL LOW (ref 3.5–5.1)
Sodium: 140 mmol/L (ref 135–145)

## 2022-12-02 LAB — CBC
HCT: 30.9 % — ABNORMAL LOW (ref 36.0–46.0)
Hemoglobin: 10.5 g/dL — ABNORMAL LOW (ref 12.0–15.0)
MCH: 30.5 pg (ref 26.0–34.0)
MCHC: 34 g/dL (ref 30.0–36.0)
MCV: 89.8 fL (ref 80.0–100.0)
Platelets: 142 10*3/uL — ABNORMAL LOW (ref 150–400)
RBC: 3.44 MIL/uL — ABNORMAL LOW (ref 3.87–5.11)
RDW: 16.5 % — ABNORMAL HIGH (ref 11.5–15.5)
WBC: 6.6 10*3/uL (ref 4.0–10.5)
nRBC: 0 % (ref 0.0–0.2)

## 2022-12-02 LAB — BLOOD GAS, ARTERIAL
Acid-base deficit: 5.2 mmol/L — ABNORMAL HIGH (ref 0.0–2.0)
Bicarbonate: 18.6 mmol/L — ABNORMAL LOW (ref 20.0–28.0)
O2 Content: 2 L/min
O2 Saturation: 99.5 %
Patient temperature: 37
pCO2 arterial: 30 mmHg — ABNORMAL LOW (ref 32–48)
pH, Arterial: 7.4 (ref 7.35–7.45)
pO2, Arterial: 131 mmHg — ABNORMAL HIGH (ref 83–108)

## 2022-12-02 LAB — MAGNESIUM: Magnesium: 2.2 mg/dL (ref 1.7–2.4)

## 2022-12-02 MED ORDER — SODIUM BICARBONATE 650 MG PO TABS
650.0000 mg | ORAL_TABLET | Freq: Three times a day (TID) | ORAL | Status: DC
  Administered 2022-12-02 – 2022-12-03 (×4): 650 mg via ORAL
  Filled 2022-12-02 (×4): qty 1

## 2022-12-02 MED ORDER — POTASSIUM CHLORIDE CRYS ER 20 MEQ PO TBCR
40.0000 meq | EXTENDED_RELEASE_TABLET | Freq: Once | ORAL | Status: AC
  Administered 2022-12-02: 40 meq via ORAL
  Filled 2022-12-02: qty 2

## 2022-12-02 NOTE — Progress Notes (Addendum)
  PROGRESS NOTE    Ashley Lynch  [name]:[id number] DOB: 09-Dec-1945 DOA: 11/30/2022 PCP: Margaretann Loveless, MD  [id number]-[id number]  LOS: 1 day   Brief hospital course:   Assessment & Plan: Ashley Lynch is a 77 y.o. 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  She denies chest pain, cough, fever or chills.     Acute on chronic systolic CHF (congestive heart failure) (HCC) Hypertensive urgency/emergency Ischemic cardiomyopathy (EF 25 to 30% 12/2021) CAD s/p PCI Denies chest pain.  Troponin 82 and BNP over 4500.  CXR showed Patchy/interstitial opacities in the lungs bilaterally, likely interstitial edema. --Patient reported to admitter different barriers to not getting her medication, however, today pt said she does not have problem getting her Rx filled. --current Echo LVEF 25%. 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  Reported  feeling hot.   Objective: Vitals:   12/02/22 0303 12/02/22 0833 12/02/22 0920 12/02/22 1252  BP: (!) 151/86 (!) 151/92  (!) 111/97  Pulse: 75 (!) 59 61 (!) 48  Resp: 18 16  15   Temp: 97.6 F (36.4 C) 98.6 F (37 C)  (!) 97.5 F (36.4 C)  TempSrc: Oral     SpO2: 99% 95%  100%  Weight:      Height:        Intake/Output Summary (Last 24 hours) at 12/02/2022 1547 Last data filed at 12/02/2022 1000 Gross per 24 hour  Intake --  Output 760 ml  Net -760 ml   Filed Weights   11/30/22 2344 12/01/22 0501 12/02/22 0214  Weight: 60.1 kg 57.7 kg 59.9 kg    Examination:   Constitutional: NAD, AAOx3 HEENT: conjunctivae and lids normal, EOMI CV: No cyanosis.   RESP: normal respiratory effort, on RA Neuro: II - XII grossly intact.   Psych: depressed mood and affect.  Appropriate judgement and reason   Data Reviewed: I have personally reviewed labs and imaging studies    Darlin Priestly, MD [facility] If 7PM-7AM, please contact night-coverage 12/02/2022, 3:47 PM

## 2022-12-02 NOTE — Plan of Care (Signed)

## 2022-12-03 ENCOUNTER — Inpatient Hospital Stay: Payer: Medicare PPO

## 2022-12-03 DIAGNOSIS — I5023 Acute on chronic systolic (congestive) heart failure: Secondary | ICD-10-CM | POA: Diagnosis not present

## 2022-12-03 LAB — CBC
HCT: 32 % — ABNORMAL LOW (ref 36.0–46.0)
Hemoglobin: 10.9 g/dL — ABNORMAL LOW (ref 12.0–15.0)
MCH: 30.4 pg (ref 26.0–34.0)
MCHC: 34.1 g/dL (ref 30.0–36.0)
MCV: 89.4 fL (ref 80.0–100.0)
Platelets: 139 K/uL — ABNORMAL LOW (ref 150–400)
RBC: 3.58 MIL/uL — ABNORMAL LOW (ref 3.87–5.11)
RDW: 16.5 % — ABNORMAL HIGH (ref 11.5–15.5)
WBC: 5.6 K/uL (ref 4.0–10.5)
nRBC: 0 % (ref 0.0–0.2)

## 2022-12-03 LAB — BASIC METABOLIC PANEL
Anion gap: 11 (ref 5–15)
BUN: 62 mg/dL — ABNORMAL HIGH (ref 8–23)
CO2: 18 mmol/L — ABNORMAL LOW (ref 22–32)
Calcium: 8.5 mg/dL — ABNORMAL LOW (ref 8.9–10.3)
Chloride: 111 mmol/L (ref 98–111)
Creatinine, Ser: 2.69 mg/dL — ABNORMAL HIGH (ref 0.44–1.00)
GFR, Estimated: 18 mL/min — ABNORMAL LOW (ref >60)
Glucose, Bld: 102 mg/dL — ABNORMAL HIGH (ref 70–99)
Potassium: 3.8 mmol/L (ref 3.5–5.1)
Sodium: 140 mmol/L (ref 135–145)

## 2022-12-03 LAB — MAGNESIUM: Magnesium: 2.3 mg/dL (ref 1.7–2.4)

## 2022-12-03 NOTE — Progress Notes (Signed)
Patient wants to go home, informed MD about the patient wants, AMA signed by patient. Patient left hospital.

## 2022-12-03 NOTE — Discharge Summary (Signed)
Physician AMA Discharge Summary   Ashley Lynch  female DOB: 1946/02/04  NGE:[id number]  PCP: Margaretann Loveless, MD  Admit date: 11/30/2022 AMA Discharge date: 12/03/2022.  Discussed with pt, daughter and granddaughter prior to pt leaving AMA.  Admitted From: home Disposition:  home CODE STATUS: Full code   Hospital Course:  For full details, please see H&P, progress notes, consult notes and ancillary notes.  Briefly,  Ashley Lynch is a 76 y.o. 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     Dyspnea 2/2 Acute on chronic systolic CHF [facility]) Hypertensive urgency/emergency Ischemic cardiomyopathy  Denies chest pain.  Troponin 82 and BNP over 4500.  CXR showed Patchy/interstitial opacities in the lungs bilaterally, likely interstitial edema. --Pt was not taking diuretic PTA, and reported different accounts of why or whether she had difficulty getting her Rx filled.  Chart review appears pt follows with cardio Dr. Welton Flakes as outpatient. --current Echo LVEF 25%. --started on IV lasix 40 BID, received 5 doses with improvement in her dyspnea.  However, Cr started trending up with diuresis, so diuretic held. --home Toprol continued and Entresto held during hospitalization. --Pt left AMA.  Pt was advised to follow up with her outpatient cardiologist about her diuretic regimen.   Acute renal failure superimposed on stage 3b chronic kidney disease (HCC) Metabolic acidosis On presentation, creatinine of 2.20, above baseline of 1.58 in 12/2021 with bicarb of 14 --received oral Na bicarb during hospitalization. --Cr trended up with diuresis, up to 2.69 on the day of pt leaving AMA.      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 The score is based on dignosis, age, lab data, medications, orders, and past utilization.   Low:  0-14.9   Medium: 15-21.9   High: 22-29.9   Extreme: 30 and above         Discharge Instructions: Med rec not done since pt left AMA.     No Known Allergies   The results of significant diagnostics from this hospitalization (including imaging, microbiology, ancillary and laboratory) are listed below for reference.   Consultations:   Procedures/Studies: ECHOCARDIOGRAM COMPLETE  Result Date: 12/01/2022    ECHOCARDIOGRAM REPORT   Patient Name:   Ashley Lynch Date of Exam: 12/01/2022 Medical Rec #:  [id number]     Height:       63.0 in Accession #:    [id number]    Weight:       127.2 lb Date of Birth:  [birth year]     BSA:          1.595 m Patient Age:    76 years      BP:           133/78 mmHg Patient Gender: F             HR:           73 bpm. Exam Location:   [facility] Procedure: 2D Echo, 3D Echo, Cardiac Doppler and Color Doppler Indications:     CHF I50.31  History:         Patient has prior history of Echocardiogram examinations, most                  recent 12/20/2021. CHF and Cardiomyopathy, CAD, Carotid Disease,                  PAD and TIA; Risk Factors:Hypertension and Current Smoker.  Sonographer:     Dondra Prader RVT RCS Referring Phys:  [id number] Andris Baumann Diagnosing Phys: Julien Nordmann MD IMPRESSIONS  1. Left ventricular ejection fraction, by estimation, is 25%  2. The left ventricle has severely decreased function. The left ventricle has no regional wall motion abnormalities. There is mild left ventricular hypertrophy.  3. Right ventricular systolic function is mildly reduced. The right ventricular size is mildly enlarged. There is mildly elevated pulmonary artery systolic pressure. The estimated right ventricular systolic pressure is 43.9 mmHg.  4. Left atrial size was moderately dilated.  5. 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 No evidence of mitral stenosis.  6. Tricuspid valve regurgitation is moderate.  7. The aortic valve is tricuspid. Aortic valve regurgitation is not visualized. Aortic valve sclerosis is present, with no evidence of aortic valve stenosis.  8. The inferior vena cava is dilated in size with >50% respiratory variability, suggesting right atrial pressure of 8 mmHg. FINDINGS  Left Ventricle: Left ventricular ejection fraction, by estimation, is 25%. The left ventricle has severely decreased function. The left ventricle has no regional wall motion abnormalities. The left ventricular internal cavity size was mildly dilated. There is mild left ventricular hypertrophy. Left ventricular diastolic parameters are indeterminate. Right Ventricle: The right ventricular size is mildly enlarged. No increase in right ventricular wall thickness. Right ventricular systolic function is mildly reduced. There is mildly  elevated pulmonary artery systolic pressure. The tricuspid regurgitant  velocity is 2.91 m/s, and with an assumed right atrial pressure of 10 mmHg, the estimated right ventricular systolic pressure is 43.9 mmHg. Left Atrium: Left atrial size was moderately dilated. Right Atrium: Right atrial size was normal in size. Pericardium: There is no evidence of pericardial effusion. Mitral Valve: The mitral valve is myxomatous. There is moderate calcification of the mitral valve leaflet(s). Moderate to severe mitral valve regurgitation. No evidence of mitral valve stenosis. Tricuspid Valve: The tricuspid valve is normal in structure. Tricuspid valve regurgitation is moderate . No evidence of tricuspid stenosis. Aortic Valve: The aortic valve is tricuspid. Aortic valve regurgitation is not visualized. Aortic valve sclerosis is present, with no evidence of aortic valve stenosis. Aortic valve mean gradient measures 2.0 mmHg. Aortic valve peak gradient measures 4.4  mmHg. Aortic valve area, by VTI measures 1.69 cm. Pulmonic Valve: The pulmonic valve was  normal in structure. Pulmonic valve regurgitation is mild. No evidence of pulmonic stenosis. Aorta: The aortic root is normal in size and structure. Venous: The inferior vena cava is dilated in size with greater than 50% respiratory variability, suggesting right atrial pressure of 8 mmHg. IAS/Shunts: No atrial level shunt detected by color flow Doppler.  LEFT VENTRICLE PLAX 2D LVIDd:         5.20 cm LVIDs:         4.10 cm LV PW:         1.20 cm LV IVS:        1.00 cm LVOT diam:     2.00 cm LV SV:         31 LV SV Index:   19 LVOT Area:     3.14 cm  RIGHT VENTRICLE            IVC RV Basal diam:  2.90 cm    IVC diam: 1.90 cm RV S prime:     7.15 cm/s TAPSE (M-mode): 1.5 cm LEFT ATRIUM              Index        RIGHT ATRIUM           Index LA diam:        3.70 cm  2.32 cm/m   RA Area:     13.20 cm LA Vol (A2C):   100.0 ml 62.68 ml/m  RA Volume:   31.20 ml  19.56 ml/m LA Vol  (A4C):   51.2 ml  32.09 ml/m LA Biplane Vol: 82.9 ml  51.96 ml/m  AORTIC VALVE                    PULMONIC VALVE AV Area (Vmax):    1.96 cm     PV Vmax:       0.74 m/s AV Area (Vmean):   1.79 cm     PV Peak grad:  2.2 mmHg AV Area (VTI):     1.69 cm AV Vmax:           105.00 cm/s AV Vmean:          69.300 cm/s AV VTI:            0.183 m AV Peak Grad:      4.4 mmHg AV Mean Grad:      2.0 mmHg LVOT Vmax:         65.55 cm/s LVOT Vmean:        39.500 cm/s LVOT VTI:          0.098 m LVOT/AV VTI ratio: 0.54  AORTA Ao Root diam: 2.90 cm Ao Asc diam:  3.60 cm MITRAL VALVE                TRICUSPID VALVE MV Area (PHT): 4.80 cm     TR Peak grad:   33.9 mmHg MV Decel Time: 158 msec     TR Vmax:        291.00 cm/s MR Peak grad: 83.2 mmHg MR Mean grad: 57.0 mmHg     SHUNTS MR Vmax:      456.00 cm/s   Systemic VTI:  0.10 m MR Vmean:     352.0 cm/s    Systemic Diam: 2.00 cm MV E velocity: 100.00 cm/s MV A velocity: 31.20 cm/s MV E/A ratio:  3.21 Julien Nordmann MD Electronically signed by Julien Nordmann MD Signature Date/Time: 12/01/2022/6:15:04 PM    Final  DG Chest 2 View  Result Date: 12/01/2022 CLINICAL DATA:  Chest pain, shortness of breath EXAM: CHEST - 2 VIEW COMPARISON:  12/19/2021 FINDINGS: Patchy/interstitial opacities in the lungs bilaterally. Asymmetric interstitial edema is favored, although multifocal pneumonia is technically possible. Suspected small right pleural effusion. No pneumothorax. Cardiomegaly. Visualized osseous structures are within normal limits. IMPRESSION: Cardiomegaly with suspected interstitial edema and small right pleural effusion. Multifocal pneumonia is also possible but not favored. Electronically Signed   By: Charline Bills M.D.   On: 12/01/2022 00:43   CT Lumbar Spine Wo Contrast  Result Date: 11/04/2022 CLINICAL DATA:  Back pain that started on October 19, 2022. No known injury. EXAM: CT LUMBAR SPINE WITHOUT CONTRAST TECHNIQUE: Multidetector CT imaging of the lumbar spine was  performed without intravenous contrast administration. Multiplanar CT image reconstructions were also generated. RADIATION DOSE REDUCTION: This exam was performed according to the departmental dose-optimization program which includes automated exposure control, adjustment of the mA and/or kV according to patient size and/or use of iterative reconstruction technique. COMPARISON:  CT lumbar spine dated 10/20/2019. FINDINGS: Segmentation: 5 lumbar type vertebrae. Alignment: Normal. Vertebrae: There is a subacute on chronic compression fracture of L3 with approximately 25-50% height loss centrally and 6 mm retropulsion into the central canal (progressed since 10/20/2019 when there was less than 25% height loss centrally and 2 mm retropulsion). This results in moderate central canal stenosis at this level. Paraspinal and other soft tissues: Large bilateral pleural effusions are partially imaged. Vascular calcifications are seen in the abdominal aorta. The abdominal aorta measures up to 2.9 cm in diameter. Disc levels: Up to moderate multilevel degenerative disc and joint disease. There is mild to moderate neuroforaminal stenosis at L5-S1 on the left. IMPRESSION: 1. Subacute on chronic compression fracture of L3 with approximately 25-50% height loss centrally and 6 mm retropulsion into the central canal resulting in moderate central canal stenosis. 2. Large bilateral pleural effusions. Aortic Atherosclerosis (ICD10-I70.0). Electronically Signed   By: Romona Curls M.D.   On: 11/04/2022 18:44      Labs: BNP (last 3 results) Recent Labs    12/19/21 0905 12/01/22 0050  BNP >4,500.0* >4,500.0*   Basic Metabolic Panel: Recent Labs  Lab 12/01/22 0050 12/02/22 0538 12/03/22 0522  NA 140 140 140  K 3.6 3.3* 3.8  CL 112* 113* 111  CO2 14* 18* 18*  GLUCOSE 110* 96 102*  BUN 51* 53* 62*  CREATININE 2.20* 2.47* 2.69*  CALCIUM 8.3* 8.3* 8.5*  MG  --  2.2 2.3   Liver Function Tests: No results for input(s):  "AST", "ALT", "ALKPHOS", "BILITOT", "PROT", "ALBUMIN" in the last 168 hours. No results for input(s): "LIPASE", "AMYLASE" in the last 168 hours. No results for input(s): "AMMONIA" in the last 168 hours. CBC: Recent Labs  Lab 12/01/22 0050 12/02/22 0538 12/03/22 0522  WBC 6.0 6.6 5.6  HGB 11.1* 10.5* 10.9*  HCT 34.8* 30.9* 32.0*  MCV 93.8 89.8 89.4  PLT 133* 142* 139*   Cardiac Enzymes: No results for input(s): "CKTOTAL", "CKMB", "CKMBINDEX", "TROPONINI" in the last 168 hours. BNP: Invalid input(s): "POCBNP" CBG: No results for input(s): "GLUCAP" in the last 168 hours. D-Dimer No results for input(s): "DDIMER" in the last 72 hours. Hgb A1c No results for input(s): "HGBA1C" in the last 72 hours. Lipid Profile No results for input(s): "CHOL", "HDL", "LDLCALC", "TRIG", "CHOLHDL", "LDLDIRECT" in the last 72 hours. Thyroid function studies No results for input(s): "TSH", "T4TOTAL", "T3FREE", "THYROIDAB" in the last 72 hours.  Invalid  input(s): "FREET3" Anemia work up No results for input(s): "VITAMINB12", "FOLATE", "FERRITIN", "TIBC", "IRON", "RETICCTPCT" in the last 72 hours. Urinalysis    Component Value Date/Time   COLORURINE YELLOW (A) 12/19/2021 1034   APPEARANCEUR HAZY (A) 12/19/2021 1034   LABSPEC 1.020 12/19/2021 1034   PHURINE 5.0 12/19/2021 1034   GLUCOSEU NEGATIVE 12/19/2021 1034   HGBUR MODERATE (A) 12/19/2021 1034   BILIRUBINUR NEGATIVE 12/19/2021 1034   KETONESUR NEGATIVE 12/19/2021 1034   PROTEINUR 100 (A) 12/19/2021 1034   NITRITE NEGATIVE 12/19/2021 1034   LEUKOCYTESUR TRACE (A) 12/19/2021 1034   Sepsis Labs Recent Labs  Lab 12/01/22 0050 12/02/22 0538 12/03/22 0522  WBC 6.0 6.6 5.6   Microbiology Recent Results (from the past 240 hour(s))  Resp Panel by RT-PCR (Flu A&B, Covid) Anterior Nasal Swab     Status: None   Collection Time: 12/01/22  3:46 AM   Specimen: Anterior Nasal Swab  Result Value Ref Range Status   SARS Coronavirus 2 by RT PCR  NEGATIVE NEGATIVE Final    Comment: (NOTE) SARS-CoV-2 target nucleic acids are NOT DETECTED.  The SARS-CoV-2 RNA is generally detectable in upper respiratory specimens during the acute phase of infection. The lowest concentration of SARS-CoV-2 viral copies this assay can detect is 138 copies/mL. A negative result does not preclude SARS-Cov-2 infection and should not be used as the sole basis for treatment or other patient management decisions. A negative result may occur with  improper specimen collection/handling, submission of specimen other than nasopharyngeal swab, presence of viral mutation(s) within the areas targeted by this assay, and inadequate number of viral copies(<138 copies/mL). A negative result must be combined with clinical observations, patient history, and epidemiological information. The expected result is Negative.  Fact Sheet for Patients:  BloggerCourse.com  Fact Sheet for Healthcare Providers:  SeriousBroker.it  This test is no t yet approved or cleared by the [place] FDA and  has been authorized for detection and/or diagnosis of SARS-CoV-2 by FDA under an Emergency Use Authorization (EUA). This EUA will remain  in effect (meaning this test can be used) for the duration of the COVID-19 declaration under Section 564(b)(1) of the Act, 21 U.S.C.section 360bbb-3(b)(1), unless the authorization is terminated  or revoked sooner.       Influenza A by PCR NEGATIVE NEGATIVE Final   Influenza B by PCR NEGATIVE NEGATIVE Final    Comment: (NOTE) The Xpert Xpress SARS-CoV-2/FLU/RSV plus assay is intended as an aid in the diagnosis of influenza from Nasopharyngeal swab specimens and should not be used as a sole basis for treatment. Nasal washings and aspirates are unacceptable for Xpert Xpress SARS-CoV-2/FLU/RSV testing.  Fact Sheet for Patients: BloggerCourse.com  Fact Sheet for  Healthcare Providers: SeriousBroker.it  This test is not yet approved or cleared by the [place] FDA and has been authorized for detection and/or diagnosis of SARS-CoV-2 by FDA under an Emergency Use Authorization (EUA). This EUA will remain in effect (meaning this test can be used) for the duration of the COVID-19 declaration under Section 564(b)(1) of the Act, 21 U.S.C. section 360bbb-3(b)(1), unless the authorization is terminated or revoked.  Performed at [facility], [address], [place], [place] [address]      Billed as Level 2 progress note.   Darlin Priestly, MD  [facility] 12/03/2022, 12:12 PM

## 2022-12-08 DIAGNOSIS — Z13820 Encounter for screening for osteoporosis: Secondary | ICD-10-CM | POA: Diagnosis not present

## 2022-12-30 ENCOUNTER — Emergency Department: Payer: Medicare PPO

## 2022-12-30 ENCOUNTER — Other Ambulatory Visit: Payer: Self-pay

## 2022-12-30 ENCOUNTER — Encounter: Payer: Self-pay | Admitting: Emergency Medicine

## 2022-12-30 ENCOUNTER — Inpatient Hospital Stay
Admission: EM | Admit: 2022-12-30 | Discharge: 2023-01-04 | DRG: 291 | Disposition: A | Discharge status: Home / Self Care | Payer: Medicare PPO | Attending: Internal Medicine | Admitting: Internal Medicine

## 2022-12-30 DIAGNOSIS — R531 Weakness: Principal | ICD-10-CM

## 2022-12-30 DIAGNOSIS — Z833 Family history of diabetes mellitus: Secondary | ICD-10-CM

## 2022-12-30 DIAGNOSIS — Z66 Do not resuscitate: Secondary | ICD-10-CM | POA: Diagnosis present

## 2022-12-30 DIAGNOSIS — E785 Hyperlipidemia, unspecified: Secondary | ICD-10-CM | POA: Diagnosis present

## 2022-12-30 DIAGNOSIS — I2489 Other forms of acute ischemic heart disease: Secondary | ICD-10-CM | POA: Diagnosis not present

## 2022-12-30 DIAGNOSIS — D696 Thrombocytopenia, unspecified: Secondary | ICD-10-CM | POA: Diagnosis present

## 2022-12-30 DIAGNOSIS — R0689 Other abnormalities of breathing: Secondary | ICD-10-CM | POA: Diagnosis not present

## 2022-12-30 DIAGNOSIS — L89152 Pressure ulcer of sacral region, stage 2: Secondary | ICD-10-CM | POA: Diagnosis present

## 2022-12-30 DIAGNOSIS — Z515 Encounter for palliative care: Secondary | ICD-10-CM

## 2022-12-30 DIAGNOSIS — R000 Tachycardia, unspecified: Secondary | ICD-10-CM | POA: Diagnosis not present

## 2022-12-30 DIAGNOSIS — I255 Ischemic cardiomyopathy: Secondary | ICD-10-CM | POA: Diagnosis present

## 2022-12-30 DIAGNOSIS — Z7901 Long term (current) use of anticoagulants: Secondary | ICD-10-CM | POA: Diagnosis not present

## 2022-12-30 DIAGNOSIS — I10 Essential (primary) hypertension: Secondary | ICD-10-CM | POA: Diagnosis not present

## 2022-12-30 DIAGNOSIS — R0902 Hypoxemia: Secondary | ICD-10-CM | POA: Diagnosis not present

## 2022-12-30 DIAGNOSIS — I739 Peripheral vascular disease, unspecified: Secondary | ICD-10-CM | POA: Diagnosis present

## 2022-12-30 DIAGNOSIS — I480 Paroxysmal atrial fibrillation: Secondary | ICD-10-CM | POA: Diagnosis not present

## 2022-12-30 DIAGNOSIS — Z8572 Personal history of non-Hodgkin lymphomas: Secondary | ICD-10-CM

## 2022-12-30 DIAGNOSIS — Z7902 Long term (current) use of antithrombotics/antiplatelets: Secondary | ICD-10-CM | POA: Diagnosis not present

## 2022-12-30 DIAGNOSIS — I110 Hypertensive heart disease with heart failure: Secondary | ICD-10-CM | POA: Diagnosis not present

## 2022-12-30 DIAGNOSIS — I495 Sick sinus syndrome: Secondary | ICD-10-CM | POA: Diagnosis not present

## 2022-12-30 DIAGNOSIS — Z1152 Encounter for screening for COVID-19: Secondary | ICD-10-CM | POA: Diagnosis not present

## 2022-12-30 DIAGNOSIS — R54 Age-related physical debility: Secondary | ICD-10-CM | POA: Diagnosis present

## 2022-12-30 DIAGNOSIS — N184 Chronic kidney disease, stage 4 (severe): Secondary | ICD-10-CM | POA: Diagnosis present

## 2022-12-30 DIAGNOSIS — I5022 Chronic systolic (congestive) heart failure: Secondary | ICD-10-CM | POA: Diagnosis not present

## 2022-12-30 DIAGNOSIS — I252 Old myocardial infarction: Secondary | ICD-10-CM | POA: Diagnosis not present

## 2022-12-30 DIAGNOSIS — E872 Acidosis, unspecified: Secondary | ICD-10-CM | POA: Diagnosis not present

## 2022-12-30 DIAGNOSIS — I130 Hypertensive heart and chronic kidney disease with heart failure and stage 1 through stage 4 chronic kidney disease, or unspecified chronic kidney disease: Principal | ICD-10-CM | POA: Diagnosis present

## 2022-12-30 DIAGNOSIS — I5023 Acute on chronic systolic (congestive) heart failure: Secondary | ICD-10-CM | POA: Diagnosis not present

## 2022-12-30 DIAGNOSIS — I5021 Acute systolic (congestive) heart failure: Secondary | ICD-10-CM | POA: Diagnosis not present

## 2022-12-30 DIAGNOSIS — Z7982 Long term (current) use of aspirin: Secondary | ICD-10-CM

## 2022-12-30 DIAGNOSIS — Z91148 Patient's other noncompliance with medication regimen for other reason: Secondary | ICD-10-CM

## 2022-12-30 DIAGNOSIS — I2510 Atherosclerotic heart disease of native coronary artery without angina pectoris: Secondary | ICD-10-CM | POA: Diagnosis not present

## 2022-12-30 DIAGNOSIS — Z955 Presence of coronary angioplasty implant and graft: Secondary | ICD-10-CM

## 2022-12-30 DIAGNOSIS — T502X6A Underdosing of carbonic-anhydrase inhibitors, benzothiadiazides and other diuretics, initial encounter: Secondary | ICD-10-CM | POA: Diagnosis present

## 2022-12-30 DIAGNOSIS — Z91199 Patient's noncompliance with other medical treatment and regimen due to unspecified reason: Secondary | ICD-10-CM

## 2022-12-30 DIAGNOSIS — F17210 Nicotine dependence, cigarettes, uncomplicated: Secondary | ICD-10-CM | POA: Diagnosis present

## 2022-12-30 DIAGNOSIS — J449 Chronic obstructive pulmonary disease, unspecified: Secondary | ICD-10-CM | POA: Diagnosis present

## 2022-12-30 DIAGNOSIS — J9811 Atelectasis: Secondary | ICD-10-CM | POA: Diagnosis not present

## 2022-12-30 DIAGNOSIS — I4891 Unspecified atrial fibrillation: Secondary | ICD-10-CM | POA: Diagnosis not present

## 2022-12-30 DIAGNOSIS — J90 Pleural effusion, not elsewhere classified: Secondary | ICD-10-CM | POA: Diagnosis not present

## 2022-12-30 DIAGNOSIS — I509 Heart failure, unspecified: Secondary | ICD-10-CM | POA: Diagnosis not present

## 2022-12-30 DIAGNOSIS — Z8619 Personal history of other infectious and parasitic diseases: Secondary | ICD-10-CM

## 2022-12-30 DIAGNOSIS — I340 Nonrheumatic mitral (valve) insufficiency: Secondary | ICD-10-CM | POA: Diagnosis present

## 2022-12-30 DIAGNOSIS — Z79899 Other long term (current) drug therapy: Secondary | ICD-10-CM | POA: Diagnosis not present

## 2022-12-30 DIAGNOSIS — E876 Hypokalemia: Secondary | ICD-10-CM | POA: Diagnosis present

## 2022-12-30 DIAGNOSIS — I2582 Chronic total occlusion of coronary artery: Secondary | ICD-10-CM | POA: Diagnosis present

## 2022-12-30 DIAGNOSIS — Z6821 Body mass index (BMI) 21.0-21.9, adult: Secondary | ICD-10-CM

## 2022-12-30 DIAGNOSIS — Z7189 Other specified counseling: Secondary | ICD-10-CM | POA: Diagnosis not present

## 2022-12-30 DIAGNOSIS — Z90710 Acquired absence of both cervix and uterus: Secondary | ICD-10-CM

## 2022-12-30 DIAGNOSIS — Z5320 Procedure and treatment not carried out because of patient's decision for unspecified reasons: Secondary | ICD-10-CM | POA: Diagnosis not present

## 2022-12-30 DIAGNOSIS — J984 Other disorders of lung: Secondary | ICD-10-CM | POA: Diagnosis not present

## 2022-12-30 DIAGNOSIS — R918 Other nonspecific abnormal finding of lung field: Secondary | ICD-10-CM | POA: Diagnosis not present

## 2022-12-30 LAB — URINALYSIS, W/ REFLEX TO CULTURE (INFECTION SUSPECTED)
Bilirubin Urine: NEGATIVE
Glucose, UA: NEGATIVE mg/dL
Ketones, ur: NEGATIVE mg/dL
Nitrite: POSITIVE — AB
Protein, ur: 100 mg/dL — AB
Specific Gravity, Urine: 1.019 (ref 1.005–1.030)
pH: 5 (ref 5.0–8.0)

## 2022-12-30 LAB — COMPREHENSIVE METABOLIC PANEL
ALT: 15 U/L (ref 0–44)
AST: 20 U/L (ref 15–41)
Albumin: 3.2 g/dL — ABNORMAL LOW (ref 3.5–5.0)
Alkaline Phosphatase: 137 U/L — ABNORMAL HIGH (ref 38–126)
Anion gap: 12 (ref 5–15)
BUN: 44 mg/dL — ABNORMAL HIGH (ref 8–23)
CO2: 19 mmol/L — ABNORMAL LOW (ref 22–32)
Calcium: 8.6 mg/dL — ABNORMAL LOW (ref 8.9–10.3)
Chloride: 112 mmol/L — ABNORMAL HIGH (ref 98–111)
Creatinine, Ser: 2.02 mg/dL — ABNORMAL HIGH (ref 0.44–1.00)
GFR, Estimated: 25 mL/min — ABNORMAL LOW (ref >60)
Glucose, Bld: 115 mg/dL — ABNORMAL HIGH (ref 70–99)
Potassium: 3.2 mmol/L — ABNORMAL LOW (ref 3.5–5.1)
Sodium: 143 mmol/L (ref 135–145)
Total Bilirubin: 0.8 mg/dL (ref 0.3–1.2)
Total Protein: 6.3 g/dL — ABNORMAL LOW (ref 6.5–8.1)

## 2022-12-30 LAB — CBC WITH DIFFERENTIAL/PLATELET
Abs Immature Granulocytes: 0.05 10*3/uL (ref 0.00–0.07)
Basophils Absolute: 0.1 10*3/uL (ref 0.0–0.1)
Basophils Relative: 1 %
Eosinophils Absolute: 0 10*3/uL (ref 0.0–0.5)
Eosinophils Relative: 0 %
HCT: 38.5 % (ref 36.0–46.0)
Hemoglobin: 12.3 g/dL (ref 12.0–15.0)
Immature Granulocytes: 1 %
Lymphocytes Relative: 13 %
Lymphs Abs: 1.1 10*3/uL (ref 0.7–4.0)
MCH: 29.6 pg (ref 26.0–34.0)
MCHC: 31.9 g/dL (ref 30.0–36.0)
MCV: 92.5 fL (ref 80.0–100.0)
Monocytes Absolute: 0.7 10*3/uL (ref 0.1–1.0)
Monocytes Relative: 9 %
Neutro Abs: 6.6 10*3/uL (ref 1.7–7.7)
Neutrophils Relative %: 76 %
Platelets: 124 10*3/uL — ABNORMAL LOW (ref 150–400)
RBC: 4.16 MIL/uL (ref 3.87–5.11)
RDW: 17.4 % — ABNORMAL HIGH (ref 11.5–15.5)
WBC: 8.6 10*3/uL (ref 4.0–10.5)
nRBC: 0 % (ref 0.0–0.2)

## 2022-12-30 LAB — LIPASE, BLOOD: Lipase: 20 U/L (ref 11–51)

## 2022-12-30 LAB — PROTIME-INR
INR: 1.3 — ABNORMAL HIGH (ref 0.8–1.2)
Prothrombin Time: 16 s — ABNORMAL HIGH (ref 11.4–15.2)

## 2022-12-30 LAB — LACTIC ACID, PLASMA
Lactic Acid, Venous: 2 mmol/L (ref 0.5–1.9)
Lactic Acid, Venous: 1.8 mmol/L (ref 0.5–1.9)

## 2022-12-30 LAB — HEPARIN LEVEL (UNFRACTIONATED): Heparin Unfractionated: 0.48 [IU]/mL (ref 0.30–0.70)

## 2022-12-30 LAB — PHOSPHORUS: Phosphorus: 3.3 mg/dL (ref 2.5–4.6)

## 2022-12-30 LAB — BRAIN NATRIURETIC PEPTIDE: B Natriuretic Peptide: >4500 pg/mL — ABNORMAL HIGH (ref 0.0–100.0)

## 2022-12-30 LAB — TROPONIN I (HIGH SENSITIVITY)
Troponin I (High Sensitivity): 203 ng/L (ref <18)
Troponin I (High Sensitivity): 222 ng/L (ref <18)

## 2022-12-30 LAB — MAGNESIUM: Magnesium: 2 mg/dL (ref 1.7–2.4)

## 2022-12-30 LAB — APTT
aPTT: 28 s (ref 24–36)
aPTT: 28 s (ref 24–36)

## 2022-12-30 LAB — SARS CORONAVIRUS 2 BY RT PCR: SARS Coronavirus 2 by RT PCR: NEGATIVE

## 2022-12-30 MED ORDER — SERTRALINE HCL 50 MG PO TABS: 50.0000 mg | ORAL_TABLET | Freq: Every day | ORAL | Status: DC

## 2022-12-30 MED ORDER — SODIUM CHLORIDE 0.9 % IV SOLN
2.0000 g | INTRAVENOUS | Status: DC
  Administered 2022-12-30: 2 g via INTRAVENOUS
  Filled 2022-12-30: qty 20

## 2022-12-30 MED ORDER — ONDANSETRON HCL 4 MG/2ML IJ SOLN
4.0000 mg | Freq: Four times a day (QID) | INTRAMUSCULAR | Status: DC | PRN
  Filled 2022-12-30: qty 2

## 2022-12-30 MED ORDER — METOPROLOL TARTRATE 5 MG/5ML IV SOLN
2.5000 mg | INTRAVENOUS | Status: DC | PRN
  Administered 2022-12-31: 2.5 mg via INTRAVENOUS
  Filled 2022-12-30: qty 5

## 2022-12-30 MED ORDER — CLOPIDOGREL BISULFATE 75 MG PO TABS: 75.0000 mg | ORAL_TABLET | Freq: Every day | ORAL | Status: DC

## 2022-12-30 MED ORDER — MORPHINE SULFATE (PF) 4 MG/ML IV SOLN
4.0000 mg | Freq: Once | INTRAVENOUS | Status: AC
  Administered 2022-12-30: 4 mg via INTRAVENOUS
  Filled 2022-12-30: qty 1

## 2022-12-30 MED ORDER — HEPARIN (PORCINE) 25000 UT/250ML-% IV SOLN
850.0000 [IU]/h | INTRAVENOUS | Status: DC
  Administered 2022-12-30: 750 [IU]/h via INTRAVENOUS
  Administered 2022-12-31: 850 [IU]/h via INTRAVENOUS
  Filled 2022-12-30 (×2): qty 250

## 2022-12-30 MED ORDER — MORPHINE SULFATE (PF) 2 MG/ML IV SOLN
2.0000 mg | Freq: Once | INTRAVENOUS | Status: AC
  Administered 2022-12-30: 2 mg via INTRAVENOUS
  Filled 2022-12-30: qty 1

## 2022-12-30 MED ORDER — ACETAMINOPHEN 325 MG PO TABS
650.0000 mg | ORAL_TABLET | ORAL | Status: DC | PRN
  Administered 2023-01-01 – 2023-01-03 (×2): 650 mg via ORAL
  Filled 2022-12-30 (×2): qty 2

## 2022-12-30 MED ORDER — PANTOPRAZOLE SODIUM 40 MG PO TBEC
40.0000 mg | DELAYED_RELEASE_TABLET | Freq: Every day | ORAL | Status: DC
  Filled 2022-12-30: qty 1

## 2022-12-30 MED ORDER — POTASSIUM CHLORIDE CRYS ER 20 MEQ PO TBCR: 40.0000 meq | EXTENDED_RELEASE_TABLET | Freq: Once | ORAL | Status: DC

## 2022-12-30 MED ORDER — METOPROLOL TARTRATE 50 MG PO TABS: 50.0000 mg | ORAL_TABLET | Freq: Two times a day (BID) | ORAL | Status: DC

## 2022-12-30 MED ORDER — METOPROLOL TARTRATE 25 MG PO TABS: 25.0000 mg | ORAL_TABLET | Freq: Two times a day (BID) | ORAL | Status: DC

## 2022-12-30 MED ORDER — MIRABEGRON ER 25 MG PO TB24
25.0000 mg | ORAL_TABLET | Freq: Every day | ORAL | Status: DC
Start: 2022-12-30 — End: 2022-12-30

## 2022-12-30 MED ORDER — SODIUM BICARBONATE 650 MG PO TABS: 650.0000 mg | ORAL_TABLET | Freq: Two times a day (BID) | ORAL | Status: DC

## 2022-12-30 MED ORDER — SACUBITRIL-VALSARTAN 24-26 MG PO TABS: 1.0000 | ORAL_TABLET | Freq: Two times a day (BID) | ORAL | Status: DC

## 2022-12-30 MED ORDER — TRAMADOL HCL 50 MG PO TABS: 50.0000 mg | ORAL_TABLET | Freq: Three times a day (TID) | ORAL | Status: DC | PRN

## 2022-12-30 MED ORDER — HEPARIN BOLUS VIA INFUSION
3000.0000 [IU] | Freq: Once | INTRAVENOUS | Status: AC
  Administered 2022-12-30: 3000 [IU] via INTRAVENOUS
  Filled 2022-12-30: qty 3000

## 2022-12-30 MED ORDER — NICOTINE 14 MG/24HR TD PT24
14.0000 mg | MEDICATED_PATCH | Freq: Every day | TRANSDERMAL | Status: DC
  Administered 2022-12-30 – 2023-01-04 (×6): 14 mg via TRANSDERMAL
  Filled 2022-12-30 (×7): qty 1

## 2022-12-30 MED ORDER — DIGOXIN 0.25 MG/ML IJ SOLN
0.1250 mg | Freq: Once | INTRAMUSCULAR | Status: AC
  Administered 2022-12-30: 0.125 mg via INTRAVENOUS
  Filled 2022-12-30 (×2): qty 2

## 2022-12-30 MED ORDER — SODIUM CHLORIDE 0.9% FLUSH
3.0000 mL | Freq: Two times a day (BID) | INTRAVENOUS | Status: DC
  Administered 2022-12-31 – 2023-01-03 (×5): 3 mL via INTRAVENOUS

## 2022-12-30 MED ORDER — SODIUM CHLORIDE 0.9% FLUSH: 3.0000 mL | INTRAVENOUS | Status: DC | PRN

## 2022-12-30 MED ORDER — POTASSIUM CHLORIDE 10 MEQ/100ML IV SOLN
10.0000 meq | INTRAVENOUS | Status: AC
  Administered 2022-12-30 (×3): 10 meq via INTRAVENOUS
  Filled 2022-12-30: qty 100

## 2022-12-30 MED ORDER — METOPROLOL TARTRATE 5 MG/5ML IV SOLN: 5.0000 mg | INTRAVENOUS | Status: DC | PRN

## 2022-12-30 MED ORDER — FUROSEMIDE 10 MG/ML IJ SOLN
40.0000 mg | Freq: Once | INTRAMUSCULAR | Status: AC
  Administered 2022-12-30: 40 mg via INTRAVENOUS
  Filled 2022-12-30: qty 4

## 2022-12-30 MED ORDER — METOPROLOL TARTRATE 25 MG PO TABS: 37.5000 mg | ORAL_TABLET | Freq: Two times a day (BID) | ORAL | Status: DC

## 2022-12-30 MED ORDER — SODIUM CHLORIDE 0.9 % IV SOLN: 250.0000 mL | INTRAVENOUS | Status: DC | PRN

## 2022-12-30 MED ORDER — SODIUM CHLORIDE 0.9 % IV SOLN: INTRAVENOUS | Status: AC

## 2022-12-30 NOTE — ED Notes (Signed)
Pt is sleeping comfortably with unlabored respirations. Family at bedside.

## 2022-12-30 NOTE — ED Notes (Signed)
Restarted Heparin infusion at 11:52. Heparin currently infusing.

## 2022-12-30 NOTE — ED Notes (Signed)
Pt went to imaging

## 2022-12-30 NOTE — ED Provider Notes (Addendum)
[facility] Provider Note    Event Date/Time   First MD Initiated Contact with Patient 12/30/22 [phone]     (approximate)   History   Weakness   HPI  Ashley Lynch is a 78 y.o. female past medical significant for CHF (EF 25%), CKD, CAD, PVD, diffuse large cell lymphoma in remission, hypertension, who presents to the emergency department for generalized weakness and fatigue.  Patient states that she is not feeling well and she is uncertain of how long.  EMS stated that the patient noted that she had worsening fatigue and generalized weakness.  They were concerned that she had a urinary tract infection and worsening swelling to her legs.  They noted that she had a new O2 requirement and her oxygen was in the 80s requiring 2 L nasal cannula at 91%.  Questionable whether she has been taking her Lasix.  Patient denies any chest pain but does endorse shortness of breath.  Endorses nausea but no episodes of vomiting.  No abdominal pain or diarrhea.  Denies any blood in her stool.     Physical Exam   Triage Vital Signs: ED Triage Vitals  Encounter Vitals Group     BP 12/30/22 0855 (!) 163/71     Systolic BP Percentile --      Diastolic BP Percentile --      Pulse Rate 12/30/22 0855 67     Resp 12/30/22 0855 (!) 35     Temp 12/30/22 0855 97.6 F (36.4 C)     Temp Source 12/30/22 0855 Oral     SpO2 12/30/22 0855 99 %     Weight 12/30/22 0854 110 lb (49.9 kg)     Height 12/30/22 0854 5\' 3"  (1.6 m)     Head Circumference --      Peak Flow --      Pain Score 12/30/22 0854 4     Pain Loc --      Pain Education --      Exclude from Growth Chart --     Most recent vital signs: Vitals:   12/30/22 1000 12/30/22 1030  BP: (!) 169/155 (!) 146/91  Pulse: (!) 58 83  Resp: 18 18  Temp: 98 F (36.7 C)   SpO2: 93% 95%    Physical Exam Constitutional:      Appearance: She is well-developed. She is ill-appearing.  HENT:     Head: Atraumatic.  Eyes:      Conjunctiva/sclera: Conjunctivae normal.  Neck:     Comments: JVD Cardiovascular:     Rate and Rhythm: Tachycardia present. Rhythm irregular.     Heart sounds: No murmur heard. Pulmonary:     Effort: Respiratory distress present.     Breath sounds: Wheezing, rhonchi and rales present.  Abdominal:     General: There is no distension.     Tenderness: There is no abdominal tenderness.  Musculoskeletal:        General: Normal range of motion.     Cervical back: Normal range of motion.     Right lower leg: Edema present.     Left lower leg: Edema present.     Comments: Worsening edema to the right lower leg.  Bilateral pitting edema.  Skin:    General: Skin is warm.     Capillary Refill: Capillary refill takes less than 2 seconds.  Neurological:     Mental Status: She is alert. Mental status is at baseline.  Psychiatric:  Mood and Affect: Mood normal.     IMPRESSION / MDM / ASSESSMENT AND PLAN / ED COURSE  I reviewed the triage vital signs and the nursing notes.  On chart review patient had a recent hospitalization 1 month ago for acute on chronic heart failure exacerbation.  Treated with IV Lasix and discharged on Lasix.  Patient has not been taking her Lasix.  Differential diagnosis including acute on chronic heart failure exacerbation, pneumonia, COVID, anemia, pulmonary embolism, ACS  EKG  I, Corena Herter, the attending physician, personally viewed and interpreted this ECG.   Rate: 109  Rhythm: Atrial fibrillation  Axis: Normal  Intervals: Normal  ST&T Change: ST depression with T waves inverted to the inferior lateral leads Atrial fibrillation appears new when compared to prior EKG  Atrial fibrillation while on cardiac telemetry.  RADIOLOGY I independently reviewed imaging, my interpretation of imaging: Chest x-ray with significant pulmonary edema that is worsening pleural effusion to the right side when compared to prior.  LABS (all labs ordered are listed,  but only abnormal results are displayed) Labs interpreted as -    Labs Reviewed  CBC WITH DIFFERENTIAL/PLATELET - Abnormal; Notable for the following components:      Result Value   RDW 17.4 (*)    Platelets 124 (*)    All other components within normal limits  COMPREHENSIVE METABOLIC PANEL - Abnormal; Notable for the following components:   Potassium 3.2 (*)    Chloride 112 (*)    CO2 19 (*)    Glucose, Bld 115 (*)    BUN 44 (*)    Creatinine, Ser 2.02 (*)    Calcium 8.6 (*)    Total Protein 6.3 (*)    Albumin 3.2 (*)    Alkaline Phosphatase 137 (*)    GFR, Estimated 25 (*)    All other components within normal limits  URINALYSIS, W/ REFLEX TO CULTURE (INFECTION SUSPECTED) - Abnormal; Notable for the following components:   Color, Urine YELLOW (*)    APPearance HAZY (*)    Hgb urine dipstick LARGE (*)    Protein, ur 100 (*)    Nitrite POSITIVE (*)    Leukocytes,Ua TRACE (*)    Bacteria, UA MANY (*)    All other components within normal limits  LACTIC ACID, PLASMA - Abnormal; Notable for the following components:   Lactic Acid, Venous 2.0 (*)    All other components within normal limits  BRAIN NATRIURETIC PEPTIDE - Abnormal; Notable for the following components:   B Natriuretic Peptide >4,500.0 (*)    All other components within normal limits  PROTIME-INR - Abnormal; Notable for the following components:   Prothrombin Time 16.0 (*)    INR 1.3 (*)    All other components within normal limits  TROPONIN I (HIGH SENSITIVITY) - Abnormal; Notable for the following components:   Troponin I (High Sensitivity) 203 (*)    All other components within normal limits  SARS CORONAVIRUS 2 BY RT PCR  CULTURE, BLOOD (ROUTINE X 2)  CULTURE, BLOOD (ROUTINE X 2)  CULTURE, BLOOD (SINGLE)  LIPASE, BLOOD  APTT  LACTIC ACID, PLASMA  HEPARIN LEVEL (UNFRACTIONATED)  TROPONIN I (HIGH SENSITIVITY)     MDM  Long discussion with the patient and her daughter who is at bedside.  Concerned  that when she goes home that she will not take any of her home medications.  She would not wish to have any chest compressions or be placed on a ventilator or if her  breathing were to worsen.  Her main goals are to stay comfortable and to keep out of the hospital.  After discussion with the patient and her daughter at bedside DNR/DNI form was filled out.  Palliative consult was placed.  COVID testing negative.  No significant anemia or leukocytosis.  Chronic thrombocytopenia.  Mild hypokalemia.  Creatinine appears to be close to her baseline.  CO2 chronically low.  Troponin significantly elevated at 200.  Elevated lactic acid of 2.0.  BNP greater than 4500.  Chest x-ray with significant right pleural effusion  Clinical picture concerning for new onset atrial fibrillation and acute on chronic heart failure exacerbation.  Patient was given IV morphine for pain control.  Given IV Lasix.  Started on heparin for atrial fibrillation -[place] Vasc score of 6.  Consulted hospitalist for admission for respiratory distress, new onset atrial fibrillation and acute on chronic heart failure exacerbation.   Clinical Course as of 12/30/22 1100  Sat Dec 30, 2022  1059 UA concerning for urinary tract infection.  Added on blood cultures.  Given IV Rocephin 2 g.  Given another dose of IV morphine [SM]    Clinical Course User Index [SM] Corena Herter, MD     PROCEDURES:  Critical Care performed: yes  .Critical Care  Performed by: Corena Herter, MD Authorized by: Corena Herter, MD   Critical care provider statement:    Critical care time (minutes):  45   Critical care time was exclusive of:  Separately billable procedures and treating other patients   Critical care was necessary to treat or prevent imminent or life-threatening deterioration of the following conditions:  Respiratory failure   Critical care was time spent personally by me on the following activities:  Development of treatment plan with  patient or surrogate, discussions with consultants, evaluation of patient's response to treatment, examination of patient, ordering and review of laboratory studies, ordering and review of radiographic studies, ordering and performing treatments and interventions, pulse oximetry, re-evaluation of patient's condition and review of old charts   Patient's presentation is most consistent with acute presentation with potential threat to life or bodily function.   MEDICATIONS ORDERED IN ED: Medications  heparin bolus via infusion 3,000 Units (has no administration in time range)  heparin ADULT infusion 100 units/mL (25000 units/239mL) (has no administration in time range)  cefTRIAXone (ROCEPHIN) 2 g in sodium chloride 0.9 % 100 mL IVPB (has no administration in time range)  morphine (PF) 4 MG/ML injection 4 mg (has no administration in time range)  morphine (PF) 2 MG/ML injection 2 mg (2 mg Intravenous Given 12/30/22 1003)  furosemide (LASIX) injection 40 mg (40 mg Intravenous Given 12/30/22 1005)    FINAL CLINICAL IMPRESSION(S) / ED DIAGNOSES   Final diagnoses:  Weakness  Hypokalemia  Acute on chronic systolic congestive heart failure (HCC)  Atrial fibrillation, unspecified type (HCC)     Rx / DC Orders   ED Discharge Orders     None        Note:  This document was prepared using Dragon voice recognition software and may include unintentional dictation errors.   Corena Herter, MD 12/30/22 1011    Corena Herter, MD 12/30/22 1100

## 2022-12-30 NOTE — ED Notes (Signed)
Called Pharmacy to notify them about the medication Digoxin Ampule was broken and not intact when it arrived in the ED. Pharmacy acknowledged and stated they would be sending a new ampule ASAP to main ED.

## 2022-12-30 NOTE — ED Notes (Signed)
Patient shouting out in pain and panting in pain. Family at bedside states "she does this to sooth herself."

## 2022-12-30 NOTE — ED Notes (Signed)
Informed MD of critical Lab values: Lactic: 2.0, Troponin: 203

## 2022-12-30 NOTE — ED Notes (Signed)
Obtained EKG

## 2022-12-30 NOTE — Progress Notes (Signed)
ANTICOAGULATION CONSULT NOTE  Pharmacy Consult for heparin infusion Indication: atrial fibrillation  No Known Allergies  Patient Measurements: Height: 5\' 3"  (160 cm) Weight: 49.9 kg (110 lb) IBW/kg (Calculated) : 52.4 Heparin Dosing Weight: 49.9 kg  Vital Signs: Temp: 97.9 F (36.6 C) (09/14 2024) Temp Source: Oral (09/14 2024) BP: 136/89 (09/14 2024) Pulse Rate: 120 (09/14 2024)  Labs: Recent Labs    12/30/22 0857 12/30/22 0858 12/30/22 1127 12/30/22 1128 12/30/22 2028  HGB 12.3  --   --   --   --   HCT 38.5  --   --   --   --   PLT 124*  --   --   --   --   APTT  --  28  --  28  --   LABPROT  --  16.0*  --   --   --   INR  --  1.3*  --   --   --   HEPARINUNFRC  --   --   --   --  0.48  CREATININE 2.02*  --   --   --   --   TROPONINIHS 203*  --  222*  --   --     Estimated Creatinine Clearance: 18.4 mL/min (A) (by C-G formula based on SCr of 2.02 mg/dL (H)).   Medical History: Past Medical History:  Diagnosis Date   Cancer (HCC)    lymphoma   Cardiomyopathy (HCC)    Complication of anesthesia    slow to wake   H/O heart artery stent 12/2019   X2   HFrEF (heart failure with reduced ejection fraction) (HCC)    History of chicken pox    History of measles, mumps, or rubella    HTN (hypertension)    Iliac artery occlusion (HCC)    Bilateral   Lymphoma in remission [facility])    Myocardial infarction (HCC) 04/23/2021   NSTEMI   PAD (peripheral artery disease) (HCC)     Assessment: 77 y.o. female  with PMH of CHF (EF 25%), CKD, CAD, PVD, diffuse large cell lymphoma in remission, hypertension  presents with atrial fibrillation. She was not on chronic anticoagulation prior to admission  9/14 2028   HL 0.48   Therapeutic x1  Goal of Therapy:  Heparin level 0.3-0.7 units/ml Monitor platelets by anticoagulation protocol: Yes  Heparin level is therapeutic at 0.48 on 750 units/hour. No issues with infusion reported, no signs/symptoms of bleeding noted   Plan:   Continue heparin infusion at 750 units/hour Check confirmatory Anti-Xa level in 8-hours Monitor daily Anti-Xa levels while on heparin infusion Monitor CBC and signs/symptoms of bleeding  Thank you for involving pharmacy in this patient's care.   Rockwell Alexandria, PharmD Clinical Pharmacist 12/30/2022 9:57 PM

## 2022-12-30 NOTE — ED Notes (Signed)
Two sets of blood cultures were collected prior to administering antibiotic. Documentation corrected!

## 2022-12-30 NOTE — ED Triage Notes (Signed)
Pt via [facility] from home. Pt called out for generalized weakness, pt had evidence of UTI and peripheral edema. Per family, pt had not been compliant with her turosemide. Pt states she is at her baseline. Also report SOB on exertion. Pt is A&Ox4 and NAD. Pt hx of CHF and CKD  96.6 axillary  114/86 BP  14 ETCO2  26 RR  100% on 2L [place], 91% on RA, pt does not wear O2 at home.  70-140 HR afib with hx  129 CBG

## 2022-12-30 NOTE — H&P (Signed)
History and Physical    Ashley Lynch UJW:[id number] DOB: [birth year] DOA: 12/30/2022  PCP: Margaretann Loveless, MD (Confirm with patient/family/NH records and if not entered, this has to be entered at [facility] point of entry) Patient coming from: Home  I have personally briefly reviewed patient's old medical records in [facility]  Chief Complaint: SOB, leg swelling  HPI: Ashley Lynch is a 77 y.o. female with medical history significant of CAD with multivessel disease, status post PCI and angioplasty, ischemic cardiomyopathy, chronic HFrEF with LVEF 25-30%, CKD stage IIIB, PVD, HLD, brought in by family member for evaluation of worsening of shortness of breath, peripheral edema.  Family had noticed patient has been having increasing bilateral lower extremity swelling and increasing shortness of breath and decided to bring patient to ED.  Family reported that patient has been coherent with or her home CHF/HTN medications.  Patient denies any chest pain, no palpitations.  No cough.  ED Course: Patient was found to be in the new onset of A-fib heart rate ranging from 60-150s, breathing on room air blood pressure elevated SBP 150s-160s.  Chest x-ray showed pulmonary congestion, potassium 3.2, bicarb 19, creatinine 2.0  Patient was started on heparin drip, 1 dose of 40 mg IV Lasix given in the ED  Review of Systems: As per HPI otherwise 14 point review of systems negative.    Past Medical History:  Diagnosis Date   Cancer (HCC)    lymphoma   Cardiomyopathy (HCC)    Complication of anesthesia    slow to wake   H/O heart artery stent 12/2019   X2   HFrEF (heart failure with reduced ejection fraction) (HCC)    History of chicken pox    History of measles, mumps, or rubella    HTN (hypertension)    Iliac artery occlusion (HCC)    Bilateral   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)    Myocardial infarction (HCC) 04/23/2021   NSTEMI   PAD (peripheral artery disease) [facility])     Past Surgical History:   Procedure Laterality Date   ABDOMINAL HYSTERECTOMY  04/18/2011   CARDIAC CATHETERIZATION  04/25/2021   CATARACT EXTRACTION W/PHACO Left 09/20/2021   Procedure: CATARACT EXTRACTION PHACO AND INTRAOCULAR LENS PLACEMENT (IOC) LEFT  18.07 01:25.3;  Surgeon: Galen Manila, MD;  Location: [facility];  Service: Ophthalmology;  Laterality: Left;   CATARACT EXTRACTION W/PHACO Right 10/04/2021   Procedure: CATARACT EXTRACTION PHACO AND INTRAOCULAR LENS PLACEMENT (IOC) RIGHT  5.68 00:44.8;  Surgeon: Galen Manila, MD;  Location: [facility];  Service: Ophthalmology;  Laterality: Right;   GALLBLADDER SURGERY  1975 1981   LEFT HEART CATH AND CORONARY ANGIOGRAPHY N/A 01/13/2020   Procedure: LEFT HEART CATH AND CORONARY ANGIOGRAPHY;  Surgeon: Laurier Nancy, MD;  Location: [facility] INVASIVE CV LAB;  Service: Cardiovascular;  Laterality: N/A;   TONSILLECTOMY  04/17/2008     reports that she has been smoking cigarettes. She has a 60 pack-year smoking history. She has never used smokeless tobacco. She reports that she does not drink alcohol and does not use drugs.  No Known Allergies  Family History  Problem Relation Age of Onset   Diabetes Mother        sibling and grandparent also     Prior to Admission medications   Medication Sig Start Date End Date Taking? Authorizing Provider  aspirin 81 MG tablet Take 81 mg by mouth daily. Patient not taking: Reported on 09/19/2021    [provider]  atorvastatin (LIPITOR) 80 MG tablet Take 80 mg by mouth. Patient not taking: Reported on 09/19/2021    [provider]  clopidogrel (PLAVIX) 75 MG tablet Take 1 tablet (75 mg total) by mouth daily. 12/22/21   Enedina Finner, MD  furosemide (LASIX) 20 MG tablet TAKE 1 TABLET BY MOUTH EVERY DAY 06/01/22   Margaretann Loveless, MD  GEMTESA 75 MG TABS Take 75 mg by mouth daily. 09/20/21   [provider]  metoprolol succinate (TOPROL-XL) 25 MG 24 hr tablet Take 1 tablet (25 mg total) by  mouth daily. 12/22/21   Enedina Finner, MD  metoprolol succinate (TOPROL-XL) 25 MG 24 hr tablet Take 1 tablet (25 mg total) by mouth daily. 12/22/21   Enedina Finner, MD  Multiple Vitamin (MULTIVITAMIN WITH MINERALS) TABS tablet Take 1 tablet by mouth daily. 12/22/21   Enedina Finner, MD  NICODERM CQ 14 MG/24HR patch Place 1 patch onto the skin daily. 08/24/21   [provider]  nitroGLYCERIN (NITROSTAT) 0.4 MG SL tablet Place 0.4 tablets under the tongue daily as needed. 08/24/21   [provider]  omeprazole (PRILOSEC) 20 MG capsule Take 1 capsule (20 mg total) by mouth daily. 11/27/16   Karamalegos, Netta Neat, DO  sacubitril-valsartan (ENTRESTO) 24-26 MG Take 1 tablet by mouth 2 (two) times daily. 12/22/21   Enedina Finner, MD  sertraline (ZOLOFT) 50 MG tablet TAKE 1 TABLET BY MOUTH EVERY DAY 06/30/22   Margaretann Loveless, MD  traMADol (ULTRAM) 50 MG tablet Take 1 tablet (50 mg total) by mouth 3 (three) times daily as needed. 11/04/22   Cameron Ali, PA-C    Physical Exam: Vitals:   12/30/22 1000 12/30/22 1030 12/30/22 1200 12/30/22 1215  BP: (!) 169/155 (!) 146/91 132/85   Pulse: (!) 58 83 (!) 127 (!) 51  Resp: 18 18 20 13   Temp: 98 F (36.7 C)     TempSrc: Axillary     SpO2: 93% 95% 95% 90%  Weight:      Height:        Constitutional: NAD, calm, comfortable Vitals:   12/30/22 1000 12/30/22 1030 12/30/22 1200 12/30/22 1215  BP: (!) 169/155 (!) 146/91 132/85   Pulse: (!) 58 83 (!) 127 (!) 51  Resp: 18 18 20 13   Temp: 98 F (36.7 C)     TempSrc: Axillary     SpO2: 93% 95% 95% 90%  Weight:      Height:       Eyes: PERRL, lids and conjunctivae normal ENMT: Mucous membranes are moist. Posterior pharynx clear of any exudate or lesions.Normal dentition.  Neck: normal, supple, no masses, no thyromegaly Respiratory: clear to auscultation bilaterally, no wheezing, bilateral fine crackles, increasing respiratory effort. No accessory muscle use.  Cardiovascular: Regular rate and  rhythm, no murmurs / rubs / gallops. 2+ extremity edema. 2+ pedal pulses. No carotid bruits.  Abdomen: no tenderness, no masses palpated. No hepatosplenomegaly. Bowel sounds positive.  Musculoskeletal: no clubbing / cyanosis. No joint deformity upper and lower extremities. Good ROM, no contractures. Normal muscle tone.  Skin: no rashes, lesions, ulcers. No induration Neurologic: CN 2-12 grossly intact. Sensation intact, DTR normal. Strength 5/5 in all 4.  Psychiatric: Normal judgment and insight. Alert and oriented x 3. Normal mood.     Labs on Admission: I have personally reviewed following labs and imaging studies  CBC: Recent Labs  Lab 12/30/22 0857  WBC 8.6  NEUTROABS 6.6  HGB 12.3  HCT 38.5  MCV 92.5  PLT 124*   Basic Metabolic Panel: Recent Labs  Lab 12/30/22 0857  NA 143  K 3.2*  CL 112*  CO2 19*  GLUCOSE 115*  BUN 44*  CREATININE 2.02*  CALCIUM 8.6*   GFR: Estimated Creatinine Clearance: 18.4 mL/min (A) (by C-G formula based on SCr of 2.02 mg/dL (H)). Liver Function Tests: Recent Labs  Lab 12/30/22 0857  AST 20  ALT 15  ALKPHOS 137*  BILITOT 0.8  PROT 6.3*  ALBUMIN 3.2*   Recent Labs  Lab 12/30/22 0857  LIPASE 20   No results for input(s): "AMMONIA" in the last 168 hours. Coagulation Profile: Recent Labs  Lab 12/30/22 0858  INR 1.3*   Cardiac Enzymes: No results for input(s): "CKTOTAL", "CKMB", "CKMBINDEX", "TROPONINI" in the last 168 hours. BNP (last 3 results) No results for input(s): "PROBNP" in the last 8760 hours. HbA1C: No results for input(s): "HGBA1C" in the last 72 hours. CBG: No results for input(s): "GLUCAP" in the last 168 hours. Lipid Profile: No results for input(s): "CHOL", "HDL", "LDLCALC", "TRIG", "CHOLHDL", "LDLDIRECT" in the last 72 hours. Thyroid Function Tests: No results for input(s): "TSH", "T4TOTAL", "FREET4", "T3FREE", "THYROIDAB" in the last 72 hours. Anemia Panel: No results for input(s): "VITAMINB12",  "FOLATE", "FERRITIN", "TIBC", "IRON", "RETICCTPCT" in the last 72 hours. Urine analysis:    Component Value Date/Time   COLORURINE YELLOW (A) 12/30/2022 1030   APPEARANCEUR HAZY (A) 12/30/2022 1030   LABSPEC 1.019 12/30/2022 1030   PHURINE 5.0 12/30/2022 1030   GLUCOSEU NEGATIVE 12/30/2022 1030   HGBUR LARGE (A) 12/30/2022 1030   BILIRUBINUR NEGATIVE 12/30/2022 1030   KETONESUR NEGATIVE 12/30/2022 1030   PROTEINUR 100 (A) 12/30/2022 1030   NITRITE POSITIVE (A) 12/30/2022 1030   LEUKOCYTESUR TRACE (A) 12/30/2022 1030    Radiological Exams on Admission: DG Chest 2 View  Result Date: 12/30/2022 CLINICAL DATA:  Cough, dyspnea. EXAM: CHEST - 2 VIEW COMPARISON:  Chest radiograph dated 12/01/2022. FINDINGS: The heart size is borderline enlarged. Vascular calcifications are seen in the aortic arch. There is a moderate right pleural effusion with associated atelectasis/airspace disease. There is no left pleural effusion. There is mild left basilar atelectasis/airspace disease. Mild diffuse bilateral interstitial opacities are seen. No pneumothorax. IMPRESSION: Moderate right pleural effusion with associated atelectasis/airspace disease. Mild left basilar atelectasis/airspace disease. Mild diffuse bilateral interstitial opacities may represent pulmonary edema. Electronically Signed   By: Romona Curls M.D.   On: 12/30/2022 09:26    EKG: Independently reviewed.  A-fib with RVR  Assessment/Plan Principal Problem:   CHF (congestive heart failure) (HCC) Active Problems:   Acute systolic (congestive) heart failure (HCC)   A-fib (HCC)  (please populate well all problems here in Problem List. (For example, if patient is on BP meds at home and you resume or decide to hold them, it is a problem that needs to be her. Same for CAD, COPD, HLD and so on)  Acute on chronic HFrEF decompensation -Likely secondary to combined effect of uncontrolled hypertension and new onset of A-fib with RVR -Case was  discussed with on-call cardiologist Dr. Darrold Junker, who recommended cautious use of AV node blocking agent as patient appears to be very sensitive to this rate control medications.  Decided to increase metoprolol from 25 daily to 50 mg daily working into 2 doses with as needed 2.5 mg every 4 hours as needed for breakthrough heart rate. -1 dose of digoxin given -Continue diuresis -Overall prognosis poor.  ED physician discussed with family at bedside who agreed with  DNR and palliative care consult.  A-fib with RVR -Accompanied with episodes of bradycardia, patient is asymptomatic and blood pressure maintained -Management as above -Heparin drip for now, likely can switch to Eliquis once patient stabilized.  Hypokalemia -IV replacement, check magnesium and phosphorus level  PVD -Continue Plavix and statin -Patient is off aspirin  CKD stage IIIb -Significant fluid overload, creatinine level stable, IV diuresis as above -Start bicarb p.o. -Daily kidney function  DVT prophylaxis: Heparin drip Code Status: DNR Family Communication: Daughter at bedside Disposition Plan: Patient is sick with significant CHF decompensation As well as new onset A-fib, requiring IV diuresis and inpatient measures, expect more than 2 midnight hospital stay Consults called: Curbside consult with cardiology, reconsult if indicated, palliative care Admission status: PCU   Emeline General MD [facility] Pager [phone]  12/30/2022, 1:11 PM

## 2022-12-30 NOTE — ED Notes (Signed)
Overdue meds. Not yet verified by pharmacy.

## 2022-12-30 NOTE — ED Notes (Signed)
Pt has overdue meds, still not verified by pharmacy. Have reached out to attending to request essential meds to be switched from PO to IV. Pt sleeping at this time.

## 2022-12-30 NOTE — Sepsis Progress Note (Signed)
Sepsis protocol is being followed by [facility]. 

## 2022-12-30 NOTE — ED Notes (Signed)
Pt sleeping. 

## 2022-12-30 NOTE — Progress Notes (Signed)
ANTICOAGULATION CONSULT NOTE  Pharmacy Consult for heparin infusion Indication: atrial fibrillation  No Known Allergies  Patient Measurements: Height: 5\' 3"  (160 cm) Weight: 49.9 kg (110 lb) IBW/kg (Calculated) : 52.4 Heparin Dosing Weight: 49.9 kg  Vital Signs: Temp: 98 F (36.7 C) (09/14 1000) Temp Source: Axillary (09/14 1000) BP: 169/155 (09/14 1000) Pulse Rate: 58 (09/14 1000)  Labs: Recent Labs    12/30/22 0857  HGB 12.3  HCT 38.5  PLT 124*  CREATININE 2.02*  TROPONINIHS 203*    Estimated Creatinine Clearance: 18.4 mL/min (A) (by C-G formula based on SCr of 2.02 mg/dL (H)).   Medical History: Past Medical History:  Diagnosis Date   Cancer (HCC)    lymphoma   Cardiomyopathy (HCC)    Complication of anesthesia    slow to wake   H/O heart artery stent 12/2019   X2   HFrEF (heart failure with reduced ejection fraction) (HCC)    History of chicken pox    History of measles, mumps, or rubella    HTN (hypertension)    Iliac artery occlusion (HCC)    Bilateral   Lymphoma in remission [facility])    Myocardial infarction (HCC) 04/23/2021   NSTEMI   PAD (peripheral artery disease) (HCC)     Assessment: 77 y.o. female  with PMH of CHF (EF 25%), CKD, CAD, PVD, diffuse large cell lymphoma in remission, hypertension  presents with atrial fibrillation. She was not on chronic anticoagulation prior to admission  Goal of Therapy:  Heparin level 0.3-0.7 units/ml Monitor platelets by anticoagulation protocol: Yes   Plan:  Give 3000 units bolus x 1 Start heparin infusion at 750 units/hr Check anti-Xa level in 8 hours and daily while on heparin Continue to monitor H&H and platelets  Lowella Bandy 12/30/2022,10:19 AM

## 2022-12-30 NOTE — ED Notes (Signed)
Heparin paused, channel error on pump channel, looking for another channel to restart heparin.

## 2022-12-30 NOTE — Plan of Care (Signed)
  Problem: Pain Managment: Goal: General experience of comfort will improve Outcome: Progressing   Problem: Safety: Goal: Ability to remain free from injury will improve Outcome: Progressing   Problem: Skin Integrity: Goal: Risk for impaired skin integrity will decrease Outcome: Progressing   

## 2022-12-30 NOTE — Code Documentation (Signed)
CODE SEPSIS - PHARMACY COMMUNICATION  **Broad Spectrum Antibiotics should be administered within 1 hour of Sepsis diagnosis**  Time Code Sepsis Called/Page Received: 1104  Antibiotics Ordered: ceftriaxone  Time of 1st antibiotic administration: 1125  Additional action taken by pharmacy: none required  If necessary, Name of Provider/Nurse Contacted: N/A    Lowella Bandy ,PharmD Clinical Pharmacist  12/30/2022  11:03 AM

## 2022-12-30 NOTE — Progress Notes (Signed)
Patient refused po medication. Will hold off po medications for today.

## 2022-12-31 ENCOUNTER — Inpatient Hospital Stay: Payer: Medicare PPO

## 2022-12-31 DIAGNOSIS — R531 Weakness: Secondary | ICD-10-CM | POA: Diagnosis not present

## 2022-12-31 DIAGNOSIS — I5023 Acute on chronic systolic (congestive) heart failure: Secondary | ICD-10-CM

## 2022-12-31 DIAGNOSIS — Z515 Encounter for palliative care: Secondary | ICD-10-CM | POA: Diagnosis not present

## 2022-12-31 DIAGNOSIS — I4891 Unspecified atrial fibrillation: Secondary | ICD-10-CM

## 2022-12-31 LAB — BASIC METABOLIC PANEL
Anion gap: 11 (ref 5–15)
BUN: 41 mg/dL — ABNORMAL HIGH (ref 8–23)
CO2: 19 mmol/L — ABNORMAL LOW (ref 22–32)
Calcium: 8.5 mg/dL — ABNORMAL LOW (ref 8.9–10.3)
Chloride: 114 mmol/L — ABNORMAL HIGH (ref 98–111)
Creatinine, Ser: 2.1 mg/dL — ABNORMAL HIGH (ref 0.44–1.00)
GFR, Estimated: 24 mL/min — ABNORMAL LOW (ref >60)
Glucose, Bld: 101 mg/dL — ABNORMAL HIGH (ref 70–99)
Potassium: 4.2 mmol/L (ref 3.5–5.1)
Sodium: 144 mmol/L (ref 135–145)

## 2022-12-31 LAB — CBC
HCT: 39.9 % (ref 36.0–46.0)
Hemoglobin: 12.8 g/dL (ref 12.0–15.0)
MCH: 30 pg (ref 26.0–34.0)
MCHC: 32.1 g/dL (ref 30.0–36.0)
MCV: 93.7 fL (ref 80.0–100.0)
Platelets: 109 10*3/uL — ABNORMAL LOW (ref 150–400)
RBC: 4.26 MIL/uL (ref 3.87–5.11)
RDW: 17.6 % — ABNORMAL HIGH (ref 11.5–15.5)
WBC: 7.9 10*3/uL (ref 4.0–10.5)
nRBC: 0 % (ref 0.0–0.2)

## 2022-12-31 LAB — HEPARIN LEVEL (UNFRACTIONATED)
Heparin Unfractionated: 0.28 [IU]/mL — ABNORMAL LOW (ref 0.30–0.70)
Heparin Unfractionated: 0.35 [IU]/mL (ref 0.30–0.70)
Heparin Unfractionated: >1.1 [IU]/mL — ABNORMAL HIGH (ref 0.30–0.70)

## 2022-12-31 MED ORDER — FUROSEMIDE 10 MG/ML IJ SOLN
20.0000 mg | Freq: Once | INTRAMUSCULAR | Status: AC
  Administered 2022-12-31: 20 mg via INTRAVENOUS
  Filled 2022-12-31: qty 2

## 2022-12-31 MED ORDER — METOPROLOL SUCCINATE ER 25 MG PO TB24
12.5000 mg | ORAL_TABLET | Freq: Two times a day (BID) | ORAL | Status: DC
  Administered 2022-12-31 – 2023-01-02 (×4): 12.5 mg via ORAL
  Filled 2022-12-31 (×5): qty 1

## 2022-12-31 MED ORDER — APIXABAN 2.5 MG PO TABS
2.5000 mg | ORAL_TABLET | Freq: Two times a day (BID) | ORAL | Status: DC
  Administered 2022-12-31 – 2023-01-04 (×9): 2.5 mg via ORAL
  Filled 2022-12-31 (×9): qty 1

## 2022-12-31 MED ORDER — HEPARIN BOLUS VIA INFUSION
750.0000 [IU] | Freq: Once | INTRAVENOUS | Status: AC
  Administered 2022-12-31: 750 [IU] via INTRAVENOUS
  Filled 2022-12-31: qty 750

## 2022-12-31 MED ORDER — FUROSEMIDE 10 MG/ML IJ SOLN
20.0000 mg | Freq: Every day | INTRAMUSCULAR | Status: DC
  Administered 2022-12-31 – 2023-01-03 (×4): 20 mg via INTRAVENOUS
  Filled 2022-12-31 (×4): qty 2

## 2022-12-31 NOTE — Evaluation (Signed)
Occupational Therapy Evaluation Patient Details Name: Ashley Lynch MRN: [id number] DOB: 10-21-1945 Today's Date: 12/31/2022   History of Present Illness Patient is a 77 year old with CHF decompensation, new onset A-fib. History of CHF, CKD, CAD, PVD, diffuse large cell lymphoma in remission, hypertension.   Clinical Impression   Chart reviewed, pt greeted in bed, agreeable to OT/PT co tx due to decreased activity tolerance. PTA pt reports her granddaughter assists as needed with ADLs, amb household distances with no AD. Pt presents with deficits in strength, endurance, activity tolerance, balance, affecting safe and optimal ADL completion. Bed mobility completed with MIN A, STS varying from CGA-MOD A various attempts with MIN-MODA required for short amb transfer to bsc. Pt will benefit from acute OT to address deficits and to facilitate return to PLOF. OT will follow acutely.       If plan is discharge home, recommend the following: A little help with walking and/or transfers;A lot of help with bathing/dressing/bathroom;Assistance with cooking/housework;Help with stairs or ramp for entrance    Functional Status Assessment  Patient has had a recent decline in their functional status and demonstrates the ability to make significant improvements in function in a reasonable and predictable amount of time.  Equipment Recommendations  BSC/3in1    Recommendations for Other Services       Precautions / Restrictions Precautions Precautions: Fall Restrictions Weight Bearing Restrictions: No      Mobility Bed Mobility Overal bed mobility: Needs Assistance Bed Mobility: Supine to Sit, Sit to Supine     Supine to sit: Min assist Sit to supine: Min assist        Transfers Overall transfer level: Needs assistance Equipment used: 1 person hand held assist, 2 person hand held assist   Sit to Stand: Contact guard assist     Step pivot transfers: Mod assist            Balance  Overall balance assessment: Needs assistance Sitting-balance support: Feet supported Sitting balance-Leahy Scale: Fair     Standing balance support: Single extremity supported Standing balance-Leahy Scale: Poor                             ADL either performed or assessed with clinical judgement   ADL Overall ADL's : Needs assistance/impaired Eating/Feeding: Sitting;Supervision/ safety   Grooming: Wash/dry face;Sitting;Supervision/safety Grooming Details (indicate cue type and reason): unable to tolerate in standing             Lower Body Dressing: Maximal assistance Lower Body Dressing Details (indicate cue type and reason): socks Toilet Transfer: Minimal assistance;Moderate assistance Toilet Transfer Details (indicate cue type and reason): to [facility] via HHA Toileting- Clothing Manipulation and Hygiene: Supervision/safety;Sitting/lateral lean       Functional mobility during ADLs: Minimal assistance (via HHA with short amb transfers)       Vision Patient Visual Report: No change from baseline       Perception         Praxis         Pertinent Vitals/Pain Pain Assessment Pain Assessment: Faces Faces Pain Scale: Hurts a little bit Pain Location: elbows around the IV sites Pain Descriptors / Indicators: Sore Pain Intervention(s): Monitored during session, Limited activity within patient's tolerance, Repositioned     Extremity/Trunk Assessment Upper Extremity Assessment Upper Extremity Assessment: Generalized weakness   Lower Extremity Assessment Lower Extremity Assessment: Generalized weakness       Communication Communication Communication: Difficulty following  commands/understanding Following commands: Follows one step commands with increased time Cueing Techniques: Verbal cues;Tactile cues;Visual cues   Cognition Arousal: Alert Behavior During Therapy: WFL for tasks assessed/performed Overall Cognitive Status: No family/caregiver present  to determine baseline cognitive functioning Area of Impairment: Attention, Memory, Following commands, Safety/judgement, Awareness, Problem solving                   Current Attention Level: Sustained Memory: Decreased short-term memory Following Commands: Follows one step commands with increased time Safety/Judgement: Decreased awareness of deficits Awareness: Emergent Problem Solving: Slow processing, Requires verbal cues, Requires tactile cues       General Comments  sp02 96% on RA, HR in the 70s bpm during activity    Exercises Other Exercises Other Exercises: edu re: role of OT, role of rehab, dishcarge recommendations, home safety, safe ADL completion   Shoulder Instructions      Home Living Family/patient expects to be discharged to:: Private residence Living Arrangements: Other relatives (grand daugther) Available Help at Discharge: Family Type of Home: Mobile home Home Access: Ramped entrance     Home Layout: One level     Bathroom Shower/Tub: Chief Strategy Officer: Standard     Home Equipment: Cane - single point          Prior Functioning/Environment Prior Level of Function : Needs assist             Mobility Comments: amb with no AD household distances, reports she will hold on to her grand daugther ADLs Comments: assist for ADL PRN (increased need for assist the last two weeks per pt report); assist for IADLs        OT Problem List: Decreased strength;Decreased activity tolerance;Impaired balance (sitting and/or standing);Decreased safety awareness;Decreased cognition;Decreased knowledge of use of DME or AE;Decreased knowledge of precautions      OT Treatment/Interventions: Self-care/ADL training;Balance training;Therapeutic exercise;Therapeutic activities;Energy conservation;DME and/or AE instruction;Patient/family education    OT Goals(Current goals can be found in the care plan section) Acute Rehab OT Goals Patient  Stated Goal: feel stronger OT Goal Formulation: With patient Time For Goal Achievement: 01/14/23 Potential to Achieve Goals: Good ADL Goals Pt Will Perform Grooming: with modified independence;sitting Pt Will Perform Lower Body Dressing: with modified independence;sitting/lateral leans Pt Will Transfer to Toilet: with modified independence Pt Will Perform Toileting - Clothing Manipulation and hygiene: with modified independence;sitting/lateral leans  OT Frequency: Min 1X/week    Co-evaluation PT/OT/SLP Co-Evaluation/Treatment: Yes Reason for Co-Treatment: To address functional/ADL transfers PT goals addressed during session: Mobility/safety with mobility OT goals addressed during session: ADL's and self-care      AM-PAC OT "6 Clicks" Daily Activity     Outcome Measure Help from another person eating meals?: None Help from another person taking care of personal grooming?: A Little Help from another person toileting, which includes using toliet, bedpan, or urinal?: A Little Help from another person bathing (including washing, rinsing, drying)?: A Lot Help from another person to put on and taking off regular upper body clothing?: A Little Help from another person to put on and taking off regular lower body clothing?: A Lot 6 Click Score: 17   End of Session Nurse Communication: Mobility status  Activity Tolerance: Patient limited by fatigue Patient left: in bed;with call bell/phone within reach;with bed alarm set  OT Visit Diagnosis: Other abnormalities of gait and mobility (R26.89);Muscle weakness (generalized) (M62.81)                Time: 0935-1000 OT  Time Calculation (min): 25 min Charges:  OT General Charges $OT Visit: 1 Visit OT Evaluation $OT Eval Moderate Complexity: 1 Mod  Oleta Mouse, OTD OTR/L  12/31/22, 1:13 PM

## 2022-12-31 NOTE — Consult Note (Signed)
WOC Nurse Consult Note: Remote consult completed via chart review.  Patient with Acute on chronic CHF exacerbation, noncompliance with medication at home.  Recent decline in ability to complete ADL's  granddaughter living with patient to assist her.  Dependent edema noted with redness to sacrum, perineum and vaginal areas.  Reason for Consult: erythema to sacrum and perineum.  Wound type: moisture associated skin damage Pressure Injury POA: NA Measurement: noted in flowsheets Wound bed: red and moist Drainage (amount, consistency, odor) scant weeping Periwound: intact Dressing procedure/placement/frequency: Cleanse perineal and sacral area with soap and water and apply moisture barrier cream.  Cover sacrum with silicone foam.  Reapply cream every shift and change foam every other day.  Will not follow at this time.  Please re-consult if needed.  Mike Gip MSN, RN, FNP-BC CWON Wound, Ostomy, Continence Nurse [facility] [phone] Pager [phone]

## 2022-12-31 NOTE — Progress Notes (Signed)
Progress Note    Ashley Lynch  WGN:[id number] DOB: April 24, 1945  DOA: 12/30/2022 PCP: Margaretann Loveless, MD      Brief Narrative:    Medical records reviewed and are as summarized below:  Ashley Lynch is a 77 y.o. female with medical history significant for CAD, PVD, chronic systolic CHF with EF of 25%, CKD stage IV, diffuse large cell lymphoma in remission, hypertension, recent hospitalization for CHF exacerbation from 11/30/2022 through 12/03/2022 (left AMA on that visit).  She presented to the hospital because of generalized weakness, fatigue, shortness of breath, lower extremity swelling, slow and fast heart rate and decreased oxygen saturation.  Her daughter said that her pulse at home was low in the 50s and then it later jumped into the 140s.  Apparently, oxygen saturation dropped into the 80s and she required 2 L of oxygen to bring Her oxygen saturation to 91%.  It is also reported that patient has not been medically adherent and has not been taking her diuretics.  Her heart rate was in the 140s in the emergency department.  She was found to have atrial fibrillation with rapid ventricular response and CHF exacerbation.  She was treated with IV metoprolol and digoxin.  Heart rate went down to into the 40s.  She eventually converted to normal sinus rhythm.       Assessment/Plan:   Principal Problem:   CHF (congestive heart failure) (HCC) Active Problems:   Acute systolic (congestive) heart failure (HCC)   A-fib (HCC)    Body mass index is 21.48 kg/m.   Acute on chronic systolic CHF: Treat with IV Lasix.  Monitor BMP, daily weight and urine output. 2D echo on 12/01/2022 showed EF estimated at 25%, moderate to severe mitral regurgitation.   Paroxysmal atrial fibrillation with RVR, tachybradycardia syndrome:- Her heart rate had dropped into the 40s after treatment with IV metoprolol.  She also received 1 dose of digoxin last night.  She is on IV heparin drip.  Monitor  heparin level per protocol. Consulted Dr. Darrold Junker, cardiologist.   Hypokalemia improved   CKD stage IV, metabolic acidosis: Patient had CKD stage IIIb but it appears she has progressed to CKD stage IV.  Monitor BMP closely.   CAD, PVD: Continue Plavix and statin.   History of diffuse large cell lymphoma in remission   Medical nonadherence   Goals of care: Plan of care was discussed with the patient and her daughter, Ashley Lynch, at the bedside.  Patient was not very communicative and seemed uninterested at this time.  However, Ashley Lynch said that patient had indicated several times in the past that she did not want to go through multiple admissions to the hospital and just wanted to stay home with her dog.  She left AGAINST MEDICAL ADVICE the last time she was admitted in August 2024. Follow-up with palliative care team.    Diet Order             DIET DYS 3 Room service appropriate? Yes; Fluid consistency: Thin  Diet effective now                            Consultants: Cardiologist  Procedures: None    Medications:    furosemide  20 mg Intravenous Once   nicotine  14 mg Transdermal Daily   sodium chloride flush  3 mL Intravenous Q12H   Continuous Infusions:  sodium chloride     heparin 850  Units/hr (12/31/22 1107)     Anti-infectives (From admission, onward)    Start     Dose/Rate Route Frequency Ordered Stop   12/30/22 1100  cefTRIAXone (ROCEPHIN) 2 g in sodium chloride 0.9 % 100 mL IVPB  Status:  Discontinued        2 g 200 mL/hr over 30 Minutes Intravenous Every 24 hours 12/30/22 1059 12/30/22 1134              Family Communication/Anticipated D/C date and plan/Code Status   DVT prophylaxis:      Code Status: Limited: Do not attempt resuscitation (DNR) -DNR-LIMITED -Do Not Intubate/DNI   Family Communication: [name],daughter, at the bedside Disposition Plan: Plan to discharge home   Status is: Inpatient Remains inpatient  appropriate because: CHF exacerbation       Subjective:   Interval events noted.  No shortness of breath or chest pain.  She seems uninterested in this encounter and does not provide much information.  Ashley Lynch, daughter, at the bedside  Objective:    Vitals:   12/31/22 0501 12/31/22 0844 12/31/22 0959 12/31/22 1103  BP: (!) 153/91 (!) 160/92  (!) 146/109  Pulse: 70 71 79 72  Resp: 18 16  16   Temp: 98 F (36.7 C) (!) 97.5 F (36.4 C)  97.7 F (36.5 C)  TempSrc:  Oral  Oral  SpO2: (!) 50% (!) 79% 96% 99%  Weight: 55 kg     Height:       No data found.   Intake/Output Summary (Last 24 hours) at 12/31/2022 1300 Last data filed at 12/31/2022 0500 Gross per 24 hour  Intake 414.62 ml  Output 300 ml  Net 114.62 ml   Filed Weights   12/30/22 0854 12/31/22 0501  Weight: 49.9 kg 55 kg    Exam:  GEN: NAD SKIN: No rash EYES: No pallor or icterus ENT: MMM CV: RRR PULM: CTA B ABD: soft, ND, NT, +BS CNS: AAO x 3, non focal EXT: Bilateral leg edema, no tenderness    Pressure Injury 12/19/21 Sacrum Right;Left Stage 2 -  Partial thickness loss of dermis presenting as a shallow open injury with a red, pink wound bed without slough. (Active)  12/19/21 1855  Location: Sacrum  Location Orientation: Right;Left  Staging: Stage 2 -  Partial thickness loss of dermis presenting as a shallow open injury with a red, pink wound bed without slough.  Wound Description (Comments):   Present on Admission: Yes     Data Reviewed:   I have personally reviewed following labs and imaging studies:  Labs: Labs show the following:   Basic Metabolic Panel: Recent Labs  Lab 12/30/22 0857 12/30/22 1127 12/31/22 0428  NA 143  --  144  K 3.2*  --  4.2  CL 112*  --  114*  CO2 19*  --  19*  GLUCOSE 115*  --  101*  BUN 44*  --  41*  CREATININE 2.02*  --  2.10*  CALCIUM 8.6*  --  8.5*  MG  --  2.0  --   PHOS  --  3.3  --    GFR Estimated Creatinine Clearance: 18.6 mL/min (A) (by  C-G formula based on SCr of 2.1 mg/dL (H)). Liver Function Tests: Recent Labs  Lab 12/30/22 0857  AST 20  ALT 15  ALKPHOS 137*  BILITOT 0.8  PROT 6.3*  ALBUMIN 3.2*   Recent Labs  Lab 12/30/22 0857  LIPASE 20   No results for input(s): "AMMONIA" in  the last 168 hours. Coagulation profile Recent Labs  Lab 12/30/22 0858  INR 1.3*    CBC: Recent Labs  Lab 12/30/22 0857 12/31/22 0428  WBC 8.6 7.9  NEUTROABS 6.6  --   HGB 12.3 12.8  HCT 38.5 39.9  MCV 92.5 93.7  PLT 124* 109*   Cardiac Enzymes: No results for input(s): "CKTOTAL", "CKMB", "CKMBINDEX", "TROPONINI" in the last 168 hours. BNP (last 3 results) No results for input(s): "PROBNP" in the last 8760 hours. CBG: No results for input(s): "GLUCAP" in the last 168 hours. D-Dimer: No results for input(s): "DDIMER" in the last 72 hours. Hgb A1c: No results for input(s): "HGBA1C" in the last 72 hours. Lipid Profile: No results for input(s): "CHOL", "HDL", "LDLCALC", "TRIG", "CHOLHDL", "LDLDIRECT" in the last 72 hours. Thyroid function studies: No results for input(s): "TSH", "T4TOTAL", "T3FREE", "THYROIDAB" in the last 72 hours.  Invalid input(s): "FREET3" Anemia work up: No results for input(s): "VITAMINB12", "FOLATE", "FERRITIN", "TIBC", "IRON", "RETICCTPCT" in the last 72 hours. Sepsis Labs: Recent Labs  Lab 12/30/22 0857 12/30/22 1127 12/31/22 0428  WBC 8.6  --  7.9  LATICACIDVEN 2.0* 1.8  --     Microbiology Recent Results (from the past 240 hour(s))  SARS Coronavirus 2 by RT PCR (hospital order, performed in [facility] hospital lab) *cepheid single result test* Anterior Nasal Swab     Status: None   Collection Time: 12/30/22  9:23 AM   Specimen: Anterior Nasal Swab  Result Value Ref Range Status   SARS Coronavirus 2 by RT PCR NEGATIVE NEGATIVE Final    Comment: (NOTE) SARS-CoV-2 target nucleic acids are NOT DETECTED.  The SARS-CoV-2 RNA is generally detectable in upper and lower respiratory  specimens during the acute phase of infection. The lowest concentration of SARS-CoV-2 viral copies this assay can detect is 250 copies / mL. A negative result does not preclude SARS-CoV-2 infection and should not be used as the sole basis for treatment or other patient management decisions.  A negative result may occur with improper specimen collection / handling, submission of specimen other than nasopharyngeal swab, presence of viral mutation(s) within the areas targeted by this assay, and inadequate number of viral copies (<250 copies / mL). A negative result must be combined with clinical observations, patient history, and epidemiological information.  Fact Sheet for Patients:   RoadLapTop.co.za  Fact Sheet for Healthcare Providers: http://kim-miller.com/  This test is not yet approved or  cleared by the [place] FDA and has been authorized for detection and/or diagnosis of SARS-CoV-2 by FDA under an Emergency Use Authorization (EUA).  This EUA will remain in effect (meaning this test can be used) for the duration of the COVID-19 declaration under Section 564(b)(1) of the Act, 21 U.S.C. section 360bbb-3(b)(1), unless the authorization is terminated or revoked sooner.  Performed at [facility], [address] [place], [place] [address]   Blood culture (routine x 2)     Status: None (Preliminary result)   Collection Time: 12/30/22 11:27 AM   Specimen: BLOOD  Result Value Ref Range Status   Specimen Description BLOOD LEFT ANTECUBITAL  Final   Special Requests   Final    BOTTLES DRAWN AEROBIC AND ANAEROBIC Blood Culture adequate volume   Culture   Final    NO GROWTH < 24 HOURS Performed at [facility], [address], [place], [place] [address]    Report Status PENDING  Incomplete  Blood culture (routine x 2)     Status: None (  Preliminary result)   Collection Time: 12/30/22 11:27 AM   Specimen: BLOOD   Result Value Ref Range Status   Specimen Description BLOOD RIGHT ANTECUBITAL  Final   Special Requests   Final    BOTTLES DRAWN AEROBIC AND ANAEROBIC Blood Culture adequate volume   Culture   Final    NO GROWTH < 24 HOURS Performed at [facility], [address] [place], [place] [address]    Report Status PENDING  Incomplete    Procedures and diagnostic studies:  DG Chest 1 View  Result Date: 12/31/2022 CLINICAL DATA:  Congestive heart failure. EXAM: CHEST  1 VIEW COMPARISON:  12/30/2022 FINDINGS: The cardio pericardial silhouette is enlarged. Bibasilar collapse/consolidation noted, right greater than left with moderate right pleural effusion. Diffuse interstitial opacity likely chronic although superimposed component of interstitial edema not excluded. Bones are diffusely demineralized. Telemetry leads overlie the chest. IMPRESSION: 1. No substantial change. 2. Bibasilar collapse/consolidation, right greater than left with moderate right pleural effusion. 3. Diffuse interstitial opacity likely chronic although superimposed component of interstitial edema not excluded. Electronically Signed   By: Kennith Center M.D.   On: 12/31/2022 07:30   DG Chest 2 View  Result Date: 12/30/2022 CLINICAL DATA:  Cough, dyspnea. EXAM: CHEST - 2 VIEW COMPARISON:  Chest radiograph dated 12/01/2022. FINDINGS: The heart size is borderline enlarged. Vascular calcifications are seen in the aortic arch. There is a moderate right pleural effusion with associated atelectasis/airspace disease. There is no left pleural effusion. There is mild left basilar atelectasis/airspace disease. Mild diffuse bilateral interstitial opacities are seen. No pneumothorax. IMPRESSION: Moderate right pleural effusion with associated atelectasis/airspace disease. Mild left basilar atelectasis/airspace disease. Mild diffuse bilateral interstitial opacities may represent pulmonary edema. Electronically Signed   By: Romona Curls  M.D.   On: 12/30/2022 09:26               LOS: 1 day   Saher Davee  [facility]   Pager on [email or web address]. If 7PM-7AM, please contact night-coverage at [email or web address]     12/31/2022, 1:00 PM

## 2022-12-31 NOTE — Evaluation (Signed)
Physical Therapy Evaluation Patient Details Name: Ashley Lynch MRN: [id number] DOB: 07/16/1945 Today's Date: 12/31/2022  History of Present Illness  Patient is a 77 year old with CHF decompensation, new onset A-fib. History of CHF, CKD, CAD, PVD, diffuse large cell lymphoma in remission, hypertension.  Clinical Impression  Patient is agreeable to PT evaluation with encouragement. Patient reports she ambulates short distance at home without assistive device. She has assistance for ADLs and her granddaughter is her caregiver at home. She has a cane that she uses if going into the community.  Today, the patient required Min A for bed mobility. Initial standing bout with hand held assistance with increased support required for for stand pivot transfers. Patient is fatigued with mobility and declined walking. Sp02 97% on room air and heart rate in the 70's after activity. The patient wants to return home at discharge with the assistance of her granddaughter who is already the caregiver. Recommend PT follow up while in the hospital to maximize independence and to decrease caregiver burden.       If plan is discharge home, recommend the following: A little help with walking and/or transfers;A little help with bathing/dressing/bathroom;Assistance with cooking/housework;Supervision due to cognitive status;Help with stairs or ramp for entrance   Can travel by private vehicle        Equipment Recommendations BSC/3in1;Rolling walker (2 wheels)  Recommendations for Other Services       Functional Status Assessment Patient has had a recent decline in their functional status and demonstrates the ability to make significant improvements in function in a reasonable and predictable amount of time.     Precautions / Restrictions Precautions Precautions: Fall Restrictions Weight Bearing Restrictions: No      Mobility  Bed Mobility Overal bed mobility: Needs Assistance Bed Mobility: Supine to Sit,  Sit to Supine     Supine to sit: Min assist Sit to supine: Min assist   General bed mobility comments: assistance for trunk support to sit upright. assistance for LE support to return to bed    Transfers Overall transfer level: Needs assistance Equipment used: 2 person hand held assist Transfers: Sit to/from Stand, Bed to chair/wheelchair/BSC Sit to Stand: Contact guard assist   Step pivot transfers: Mod assist       General transfer comment: increased assistance requried with fatigue. verbal cues for technique    Ambulation/Gait               General Gait Details: patient declined ambulation due to fatigue  Stairs            Wheelchair Mobility     Tilt Bed    Modified Rankin (Stroke Patients Only)       Balance Overall balance assessment: Needs assistance Sitting-balance support: Feet supported Sitting balance-Leahy Scale: Fair     Standing balance support: Single extremity supported Standing balance-Leahy Scale: Poor Standing balance comment: poor to fair with external support required                             Pertinent Vitals/Pain Pain Assessment Pain Assessment: Faces Faces Pain Scale: Hurts a little bit Pain Location: elbows around the IV sites Pain Descriptors / Indicators: Sore Pain Intervention(s): Limited activity within patient's tolerance, Monitored during session, Repositioned    Home Living Family/patient expects to be discharged to:: Private residence Living Arrangements: Other relatives Engineer, building services) Available Help at Discharge: Family Type of Home: Mobile home Home Access: Ramped entrance  Home Layout: One level Home Equipment: Cane - single point      Prior Function Prior Level of Function : Needs assist             Mobility Comments: short distance ambulation without device. ADLs Comments: family assists with ALDs     Extremity/Trunk Assessment   Upper Extremity Assessment Upper  Extremity Assessment: Generalized weakness    Lower Extremity Assessment Lower Extremity Assessment: Generalized weakness       Communication   Communication Communication: No apparent difficulties  Cognition Arousal: Alert Behavior During Therapy: WFL for tasks assessed/performed Overall Cognitive Status: No family/caregiver present to determine baseline cognitive functioning                                 General Comments: patient is grossly oriented. she is able to follow single step commands with increased time. mild delay with following commnands with occasional repetition required        General Comments General comments (skin integrity, edema, etc.): Sp02 96% on room air, heart rate in the 70's with activity.    Exercises     Assessment/Plan    PT Assessment Patient needs continued PT services  PT Problem List Decreased strength;Decreased activity tolerance;Decreased balance;Decreased mobility;Decreased safety awareness       PT Treatment Interventions Gait training;DME instruction;Stair training;Functional mobility training;Therapeutic exercise;Therapeutic activities;Balance training;Neuromuscular re-education;Cognitive remediation;Patient/family education;Wheelchair mobility training    PT Goals (Current goals can be found in the Care Plan section)  Acute Rehab PT Goals Patient Stated Goal: to go home with her family PT Goal Formulation: With patient Time For Goal Achievement: 01/14/23 Potential to Achieve Goals: Fair    Frequency Min 1X/week     Co-evaluation PT/OT/SLP Co-Evaluation/Treatment: Yes Reason for Co-Treatment: To address functional/ADL transfers (limited endurance) PT goals addressed during session: Mobility/safety with mobility         AM-PAC PT "6 Clicks" Mobility  Outcome Measure Help needed turning from your back to your side while in a flat bed without using bedrails?: A Little Help needed moving from lying on your  back to sitting on the side of a flat bed without using bedrails?: A Little Help needed moving to and from a bed to a chair (including a wheelchair)?: A Little Help needed standing up from a chair using your arms (e.g., wheelchair or bedside chair)?: A Little Help needed to walk in hospital room?: A Little Help needed climbing 3-5 steps with a railing? : A Lot 6 Click Score: 17    End of Session   Activity Tolerance: Patient limited by fatigue Patient left: in bed;with call bell/phone within reach;with bed alarm set;with family/visitor present   PT Visit Diagnosis: Unsteadiness on feet (R26.81);Muscle weakness (generalized) (M62.81)    Time: 0937-1000 PT Time Calculation (min) (ACUTE ONLY): 23 min   Charges:   PT Evaluation $PT Eval Moderate Complexity: 1 Mod   PT General Charges $$ ACUTE PT VISIT: 1 Visit         Donna Bernard, PT, MPT  [facility] [employer] 12/31/2022, 11:09 AM

## 2022-12-31 NOTE — Progress Notes (Addendum)
Patient made strict NPO until SLP eval as patient is coughing/wet sounding after drinking water. Previously able to drink water without issue. Despite aspiration precautions: HOB up to 90, small sips, etc. Patient with cough/wet sounding after small sips of water x2. Will provide mouth swabs as substitute until SLP eval.

## 2022-12-31 NOTE — Evaluation (Signed)
Clinical/Bedside Swallow Evaluation Patient Details  Name: Ashley Lynch MRN: [id number] Date of Birth: 1946/03/13  Today's Date: 12/31/2022 Time: SLP Start Time (ACUTE ONLY): 0845 SLP Stop Time (ACUTE ONLY): 0906 SLP Time Calculation (min) (ACUTE ONLY): 21 min  Past Medical History:  Past Medical History:  Diagnosis Date   Cancer (HCC)    lymphoma   Cardiomyopathy (HCC)    Complication of anesthesia    slow to wake   H/O heart artery stent 12/2019   X2   HFrEF (heart failure with reduced ejection fraction) (HCC)    History of chicken pox    History of measles, mumps, or rubella    HTN (hypertension)    Iliac artery occlusion (HCC)    Bilateral   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)    Myocardial infarction (HCC) 04/23/2021   NSTEMI   PAD (peripheral artery disease) [facility])    Past Surgical History:  Past Surgical History:  Procedure Laterality Date   ABDOMINAL HYSTERECTOMY  04/18/2011   CARDIAC CATHETERIZATION  04/25/2021   CATARACT EXTRACTION W/PHACO Left 09/20/2021   Procedure: CATARACT EXTRACTION PHACO AND INTRAOCULAR LENS PLACEMENT (IOC) LEFT  18.07 01:25.3;  Surgeon: Galen Manila, MD;  Location: [facility];  Service: Ophthalmology;  Laterality: Left;   CATARACT EXTRACTION W/PHACO Right 10/04/2021   Procedure: CATARACT EXTRACTION PHACO AND INTRAOCULAR LENS PLACEMENT (IOC) RIGHT  5.68 00:44.8;  Surgeon: Galen Manila, MD;  Location: [facility];  Service: Ophthalmology;  Laterality: Right;   GALLBLADDER SURGERY  1975 1981   LEFT HEART CATH AND CORONARY ANGIOGRAPHY N/A 01/13/2020   Procedure: LEFT HEART CATH AND CORONARY ANGIOGRAPHY;  Surgeon: Laurier Nancy, MD;  Location: [facility] INVASIVE CV [facility];  Service: Cardiovascular;  Laterality: N/A;   TONSILLECTOMY  04/17/2008   HPI:  77 y.o. female with medical history significant for CAD with severe multivessel disease, s/p PCI with stent angioplasty, PAD, nicotine dependence, ischemic cardiomyopathy with last known  LVEF of 25% who presented to the ER for evaluation of worsening shortness of breath over the last 1 week associated with bilateral lower extremity swelling, orthopnea and intermittent right-sided chest pain.    Assessment / Plan / Recommendation  Clinical Impression  Pt seen for clinical swallowing evaluation. Pt alert and cooperative. Health and safety inspector for POs. Oral motor exam significant for xerostomia, presence of upper and lower dentures, and generalized oral motor weakness. Pt with baseline congested/wet cough. Pt given trials of solid, pureed, and thin liquids. Pt fed self with set up. Pt presents with s/sx mild oral dysphagia c/b mildly prolonged mastication which is likely due to dental status in setting of deconditioned state and current respiratory status. No overt s/sx pharyngeal dysphagia; however, pt is at increased risk given increased WOB with PO trials and overall medical status. Recommend cautious initiation of a mech soft diet with thin liquids and safe swallowing strategies/aspiration precautions as outlined below. SLP to f/u per POC for ongoing dysphagia management including diet tolerance and trials of upgraded textures pending improvements in respiratory status.  SLP Visit Diagnosis: Dysphagia, unspecified (R13.10)    Aspiration Risk  Mild aspiration risk    Diet Recommendation Dysphagia 3 (Mech soft);Thin liquid    Liquid Administration via: Spoon;Cup;Straw Medication Administration: Whole meds with puree (vs crushed) Supervision: Patient able to self feed;Intermittent supervision to cue for compensatory strategies Compensations: Minimize environmental distractions;Slow rate;Small sips/bites (rest breaks PRN; reflux precautions) Postural Changes: Seated upright at 90 degrees;Remain upright for at least 30 minutes after po intake    Other  Recommendations Recommended Consults:  (Palliative Care) Oral Care Recommendations: Oral care BID;Staff/trained caregiver to provide oral care     Recommendations for follow up therapy are one component of a multi-disciplinary discharge planning process, led by the attending physician.  Recommendations may be updated based on patient status, additional functional criteria and insurance authorization.  Follow up Recommendations  (TBD)         Functional Status Assessment Patient has had a recent decline in their functional status and demonstrates the ability to make significant improvements in function in a reasonable and predictable amount of time.  Frequency and Duration min 2x/week  2 weeks       Prognosis Prognosis for improved oropharyngeal function: Fair Barriers to Reach Goals:  (overall medical status)      Swallow Study   General Date of Onset: 12/30/22 HPI: 77 y.o. female with medical history significant for CAD with severe multivessel disease, s/p PCI with stent angioplasty, PAD, nicotine dependence, ischemic cardiomyopathy with last known LVEF of 25% who presented to the ER for evaluation of worsening shortness of breath over the last 1 week associated with bilateral lower extremity swelling, orthopnea and intermittent right-sided chest pain. Type of Study: Bedside Swallow Evaluation Previous Swallow Assessment: none Diet Prior to this Study: NPO Temperature Spikes Noted: No Respiratory Status: Room air History of Recent Intubation: No Behavior/Cognition: Alert;Cooperative;Pleasant mood Oral Cavity Assessment: Dry Oral Care Completed by SLP: Yes Oral Cavity - Dentition: Dentures, top;Dentures, bottom Vision: Functional for self-feeding Self-Feeding Abilities: Able to feed self Patient Positioning: Upright in bed Baseline Vocal Quality: Hoarse Volitional Cough: Strong;Wet;Congested Volitional Swallow: Able to elicit    Oral/Motor/Sensory Function Overall Oral Motor/Sensory Function: Generalized oral weakness   Ice Chips Ice chips: Not tested   Thin Liquid Thin Liquid: Impaired Presentation: Cup;Straw;Self  Fed Oral Phase Impairments:  (WFL) Oral Phase Functional Implications:  (WFL) Pharyngeal  Phase Impairments:  (increased WOB)    Nectar Thick Nectar Thick Liquid: Not tested   Honey Thick Honey Thick Liquid: Not tested   Puree Puree: Within functional limits Presentation: Spoon;Self Fed   Solid     Solid: Impaired Oral Phase Impairments: Impaired mastication Oral Phase Functional Implications: Impaired mastication Pharyngeal Phase Impairments:  (WFL)     Clyde Canterbury, M.S., CCC-SLP Speech-Language Pathologist [employer] [facility] [phone] (ASCOM)  Ashley Lynch Ashley Lynch 12/31/2022,10:19 AM

## 2022-12-31 NOTE — Significant Event (Signed)
77 y/o F here for SOB, CHF exac, afib. Refused oral meds in ED and some meds added on as IV (one of her home meds is toprol xl). She has IV lopressor ordered 2.5 for HR >110. BP 160s SBP and tele called to inform patient was spiking/SVT runs into the 130s-150s. Pushed the 2.5 of lopressor. Now patient is dropping into the 40s, but not sustaining. mostly 60s/70s and looks like she is trying to switch between NSR and afib. Tele notified to call me if HR sustains brady or if she has pauses. On-call made aware as well.

## 2022-12-31 NOTE — Progress Notes (Signed)
ANTICOAGULATION CONSULT NOTE  Pharmacy Consult for heparin infusion Indication: atrial fibrillation  No Known Allergies  Patient Measurements: Height: 5\' 3"  (160 cm) Weight: 55 kg (121 lb 4.1 oz) IBW/kg (Calculated) : 52.4 Heparin Dosing Weight: 49.9 kg  Vital Signs: Temp: 98 F (36.7 C) (09/15 0501) Temp Source: Oral (09/14 2024) BP: 153/91 (09/15 0501) Pulse Rate: 70 (09/15 0501)  Labs: Recent Labs    12/30/22 0857 12/30/22 0858 12/30/22 1127 12/30/22 1128 12/30/22 2028 12/31/22 0428  HGB 12.3  --   --   --   --  12.8  HCT 38.5  --   --   --   --  39.9  PLT 124*  --   --   --   --  109*  APTT  --  28  --  28  --   --   LABPROT  --  16.0*  --   --   --   --   INR  --  1.3*  --   --   --   --   HEPARINUNFRC  --   --   --   --  0.48 0.28*  CREATININE 2.02*  --   --   --   --  2.10*  TROPONINIHS 203*  --  222*  --   --   --     Estimated Creatinine Clearance: 18.6 mL/min (A) (by C-G formula based on SCr of 2.1 mg/dL (H)).   Medical History: Past Medical History:  Diagnosis Date   Cancer (HCC)    lymphoma   Cardiomyopathy (HCC)    Complication of anesthesia    slow to wake   H/O heart artery stent 12/2019   X2   HFrEF (heart failure with reduced ejection fraction) (HCC)    History of chicken pox    History of measles, mumps, or rubella    HTN (hypertension)    Iliac artery occlusion (HCC)    Bilateral   Lymphoma in remission [facility])    Myocardial infarction (HCC) 04/23/2021   NSTEMI   PAD (peripheral artery disease) (HCC)     Assessment: 77 y.o. female  with PMH of CHF (EF 25%), CKD, CAD, PVD, diffuse large cell lymphoma in remission, hypertension  presents with atrial fibrillation. She was not on chronic anticoagulation prior to admission  9/14 2028   HL 0.48   Therapeutic x1  Goal of Therapy:  Heparin level 0.3-0.7 units/ml Monitor platelets by anticoagulation protocol: Yes  Heparin level is therapeutic at 0.48 on 750 units/hour. No issues with  infusion reported, no signs/symptoms of bleeding noted   Plan:  9/15:  HL @ 0428 = 0.28, SUBtherapeutic  - Will order heparin 750 units IV X 1 bolus and increase drip rate to 850 units/hr - Will recheck HL 8 hrs after rate change  Thank you for involving pharmacy in this patient's care.   Madyn Ivins D Clinical Pharmacist 12/31/2022 5:24 AM

## 2022-12-31 NOTE — Progress Notes (Signed)
ANTICOAGULATION CONSULT NOTE  Pharmacy Consult for heparin infusion Indication: atrial fibrillation  No Known Allergies  Patient Measurements: Height: 5\' 3"  (160 cm) Weight: 55 kg (121 lb 4.1 oz) IBW/kg (Calculated) : 52.4 Heparin Dosing Weight: 49.9 kg  Vital Signs: Temp: 97.7 F (36.5 C) (09/15 1103) Temp Source: Oral (09/15 1103) BP: 146/109 (09/15 1103) Pulse Rate: 72 (09/15 1103)  Labs: Recent Labs    12/30/22 0857 12/30/22 0858 12/30/22 1127 12/30/22 1128 12/30/22 2028 12/31/22 0428 12/31/22 1310  HGB 12.3  --   --   --   --  12.8  --   HCT 38.5  --   --   --   --  39.9  --   PLT 124*  --   --   --   --  109*  --   APTT  --  28  --  28  --   --   --   LABPROT  --  16.0*  --   --   --   --   --   INR  --  1.3*  --   --   --   --   --   HEPARINUNFRC  --   --   --   --  0.48 0.28* 0.35  CREATININE 2.02*  --   --   --   --  2.10*  --   TROPONINIHS 203*  --  222*  --   --   --   --     Estimated Creatinine Clearance: 18.6 mL/min (A) (by C-G formula based on SCr of 2.1 mg/dL (H)).   Medical History: Past Medical History:  Diagnosis Date   Cancer (HCC)    lymphoma   Cardiomyopathy (HCC)    Complication of anesthesia    slow to wake   H/O heart artery stent 12/2019   X2   HFrEF (heart failure with reduced ejection fraction) (HCC)    History of chicken pox    History of measles, mumps, or rubella    HTN (hypertension)    Iliac artery occlusion (HCC)    Bilateral   Lymphoma in remission [facility])    Myocardial infarction (HCC) 04/23/2021   NSTEMI   PAD (peripheral artery disease) (HCC)     Assessment: 77 y.o. female  with PMH of CHF (EF 25%), CKD, CAD, PVD, diffuse large cell lymphoma in remission, hypertension  presents with atrial fibrillation. She was not on chronic anticoagulation prior to admission  9/14 2028   HL 0.48   Therapeutic x1 9/15:  HL @ 0428 = 0.28, SUBtherapeutic  9/15 1310 HL 0.35, Therapeutic x1   Goal of Therapy:  Heparin level  0.3-0.7 units/ml Monitor platelets by anticoagulation protocol: Yes  Heparin level is therapeutic at 0.48 on 750 units/hour. No issues with infusion reported, no signs/symptoms of bleeding noted   Plan:  9/15 1310 HL 0.35, Therapeutic x1 - Will continue drip rate at 850 units/hr - Will recheck HL in 8 hrs  CBC daily Thank you for involving pharmacy in this patient's care.   Codey Burling A Clinical Pharmacist 12/31/2022 1:39 PM

## 2022-12-31 NOTE — Progress Notes (Signed)
[facility]  CARDIOLOGY CONSULT NOTE  Patient ID: Ashley Lynch MRN: [id number] DOB/AGE: 1945/08/07 77 y.o.  Admit date: 12/30/2022 Referring Physician [facility] Primary Physician  Primary Cardiologist  Reason for Consultation congestive heart failure  HPI: 77 year old female referred for evaluation of congestive heart failure.  Patient has history of coronary artery disease, ischemic cardiomyopathy, and chronic HFrEF.  She also has history of chronic kidney disease stage IV, and diffuse large cell lymphoma in remission.  She was recently hospitalized for acute HFrEF 11/30/2022 and left AMA on 12/03/2022.  Reviewing outside records, the patient has not had consistent cardiology follow-up in the past 1 to 2 years.  Patient's daughter and granddaughter in the room.  Apparently the patient is unable to get up off the floor, called EMS, and was brought to [facility] [place] ED primarily for tachycardia, where ECG revealed atrial fibrillation with rapid ventricular rate.  The patient was treated with intravenous metoprolol tartrate and digoxin, and the patient converted to sinus rhythm, and remains in sinus rhythm at 77 bpm.  Upon questioning, patient denies chest pain.  Troponin mildly elevated 203, 222 without ischemic ECG changes.  The patient has a history of NSTEMI 01/13/2020, was transferred to [facility] where she underwent PCI with stents to LAD and left circumflex, with residual chronically occluded RCA.  Most recent 2D echocardiogram 12/01/2022 revealed LVEF 25%.  AMP greater than 4500.  Review of systems complete and found to be negative unless listed above     Past Medical History:  Diagnosis Date   Cancer (HCC)    lymphoma   Cardiomyopathy (HCC)    Complication of anesthesia    slow to wake   H/O heart artery stent 12/2019   X2   HFrEF (heart failure with reduced ejection fraction) (HCC)    History of chicken pox    History of measles, mumps, or rubella    HTN (hypertension)    Iliac artery occlusion  (HCC)    Bilateral   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)    Myocardial infarction (HCC) 04/23/2021   NSTEMI   PAD (peripheral artery disease) [facility])     Past Surgical History:  Procedure Laterality Date   ABDOMINAL HYSTERECTOMY  04/18/2011   CARDIAC CATHETERIZATION  04/25/2021   CATARACT EXTRACTION W/PHACO Left 09/20/2021   Procedure: CATARACT EXTRACTION PHACO AND INTRAOCULAR LENS PLACEMENT (IOC) LEFT  18.07 01:25.3;  Surgeon: Galen Manila, MD;  Location: [facility];  Service: Ophthalmology;  Laterality: Left;   CATARACT EXTRACTION W/PHACO Right 10/04/2021   Procedure: CATARACT EXTRACTION PHACO AND INTRAOCULAR LENS PLACEMENT (IOC) RIGHT  5.68 00:44.8;  Surgeon: Galen Manila, MD;  Location: [facility];  Service: Ophthalmology;  Laterality: Right;   GALLBLADDER SURGERY  1975 1981   LEFT HEART CATH AND CORONARY ANGIOGRAPHY N/A 01/13/2020   Procedure: LEFT HEART CATH AND CORONARY ANGIOGRAPHY;  Surgeon: Laurier Nancy, MD;  Location: [facility] INVASIVE CV LAB;  Service: Cardiovascular;  Laterality: N/A;   TONSILLECTOMY  04/17/2008    Medications Prior to Admission  Medication Sig Dispense Refill Last Dose   aspirin 81 MG tablet Take 81 mg by mouth daily. (Patient not taking: Reported on 09/19/2021)      atorvastatin (LIPITOR) 80 MG tablet Take 80 mg by mouth. (Patient not taking: Reported on 09/19/2021)      clopidogrel (PLAVIX) 75 MG tablet Take 1 tablet (75 mg total) by mouth daily. (Patient not taking: Reported on 12/30/2022) 30 tablet 1 Not Taking   furosemide (LASIX) 20 MG tablet TAKE  1 TABLET BY MOUTH EVERY DAY (Patient not taking: Reported on 12/30/2022) 30 tablet 1 Not Taking   GEMTESA 75 MG TABS Take 75 mg by mouth daily. (Patient not taking: Reported on 12/30/2022)   Not Taking   metoprolol succinate (TOPROL-XL) 25 MG 24 hr tablet Take 1 tablet (25 mg total) by mouth daily. (Patient not taking: Reported on 12/30/2022) 30 tablet 1 Not Taking   Multiple Vitamin (MULTIVITAMIN WITH  MINERALS) TABS tablet Take 1 tablet by mouth daily. (Patient not taking: Reported on 12/30/2022) 30 tablet 1 Not Taking   NICODERM CQ 14 MG/24HR patch Place 1 patch onto the skin daily.      nitroGLYCERIN (NITROSTAT) 0.4 MG SL tablet Place 0.4 tablets under the tongue daily as needed. (Patient not taking: Reported on 12/30/2022)   Not Taking   omeprazole (PRILOSEC) 20 MG capsule Take 1 capsule (20 mg total) by mouth daily. (Patient not taking: Reported on 12/30/2022) 90 capsule 3 Not Taking   sacubitril-valsartan (ENTRESTO) 24-26 MG Take 1 tablet by mouth 2 (two) times daily. (Patient not taking: Reported on 12/30/2022) 60 tablet 1 Not Taking   sertraline (ZOLOFT) 50 MG tablet TAKE 1 TABLET BY MOUTH EVERY DAY (Patient not taking: Reported on 12/30/2022) 90 tablet 2 Not Taking   traMADol (ULTRAM) 50 MG tablet Take 1 tablet (50 mg total) by mouth 3 (three) times daily as needed. (Patient not taking: Reported on 12/30/2022) 30 tablet 0 Not Taking   Social History   Socioeconomic History   Marital status: Widowed    Spouse name: Not on file   Number of children: Not on file   Years of education: Not on file   Highest education level: Not on file  Occupational History   Not on file  Tobacco Use   Smoking status: Every Day    Current packs/day: 1.00    Average packs/day: 1 pack/day for 60.0 years (60.0 ttl pk-yrs)    Types: Cigarettes   Smokeless tobacco: Never  Vaping Use   Vaping status: Never Used  Substance and Sexual Activity   Alcohol use: No   Drug use: No   Sexual activity: Not on file  Other Topics Concern   Not on file  Social History Narrative   Not on file   Social Determinants of Health   Financial Resource Strain: Low Risk  (04/24/2021)   Received [facility], [facility]   Overall Financial Resource Strain (CARDIA)    Difficulty of Paying Living Expenses: Not hard at all  Food Insecurity: No Food Insecurity (12/01/2022)   Hunger Vital Sign    Worried About  Running Out of Food in the Last Year: Never true    Ran Out of Food in the Last Year: Never true  Transportation Needs: No Transportation Needs (12/01/2022)   PRAPARE - Administrator, Civil Service (Medical): No    Lack of Transportation (Non-Medical): No  Physical Activity: Not on file  Stress: Not on file  Social Connections: Not on file  Intimate Partner Violence: Not At Risk (12/01/2022)   Humiliation, Afraid, Rape, and Kick questionnaire    Fear of Current or Ex-Partner: No    Emotionally Abused: No    Physically Abused: No    Sexually Abused: No    Family History  Problem Relation Age of Onset   Diabetes Mother        sibling and grandparent also      Review of systems complete and found  to be negative unless listed above      PHYSICAL EXAM  General: Well developed, well nourished, in no acute distress HEENT:  Normocephalic and atramatic Neck:  No JVD.  Lungs: Clear bilaterally to auscultation and percussion. Heart: HRRR . Normal S1 and S2 without gallops or murmurs.  Abdomen: Bowel sounds are positive, abdomen soft and non-tender  Msk:  Back normal, normal gait. Normal strength and tone for age. Extremities: No clubbing, cyanosis or edema.   Neuro: Alert and oriented X 3. Psych:  Good affect, responds appropriately  Labs:   Lab Results  Component Value Date   WBC 7.9 12/31/2022   HGB 12.8 12/31/2022   HCT 39.9 12/31/2022   MCV 93.7 12/31/2022   PLT 109 (L) 12/31/2022    Recent Labs  Lab 12/30/22 0857 12/31/22 0428  NA 143 144  K 3.2* 4.2  CL 112* 114*  CO2 19* 19*  BUN 44* 41*  CREATININE 2.02* 2.10*  CALCIUM 8.6* 8.5*  PROT 6.3*  --   BILITOT 0.8  --   ALKPHOS 137*  --   ALT 15  --   AST 20  --   GLUCOSE 115* 101*   Lab Results  Component Value Date   TROPONINI <0.03 06/28/2017    Lab Results  Component Value Date   CHOL 130 01/13/2020   CHOL 180 06/29/2017   CHOL 190 06/19/2016   Lab Results  Component Value Date    HDL 43 01/13/2020   HDL 36 (L) 06/29/2017   HDL 35 (L) 06/19/2016   Lab Results  Component Value Date   LDLCALC 70 01/13/2020   LDLCALC 109 (H) 06/29/2017   LDLCALC 127 (H) 06/19/2016   Lab Results  Component Value Date   TRIG 83 01/13/2020   TRIG 176 (H) 06/29/2017   TRIG 141 06/19/2016   Lab Results  Component Value Date   CHOLHDL 3.0 01/13/2020   CHOLHDL 5.0 06/29/2017   CHOLHDL 5.4 (H) 06/19/2016   No results found for: "LDLDIRECT"    Radiology: DG Chest 1 View  Result Date: 12/31/2022 CLINICAL DATA:  Congestive heart failure. EXAM: CHEST  1 VIEW COMPARISON:  12/30/2022 FINDINGS: The cardio pericardial silhouette is enlarged. Bibasilar collapse/consolidation noted, right greater than left with moderate right pleural effusion. Diffuse interstitial opacity likely chronic although superimposed component of interstitial edema not excluded. Bones are diffusely demineralized. Telemetry leads overlie the chest. IMPRESSION: 1. No substantial change. 2. Bibasilar collapse/consolidation, right greater than left with moderate right pleural effusion. 3. Diffuse interstitial opacity likely chronic although superimposed component of interstitial edema not excluded. Electronically Signed   By: Kennith Center M.D.   On: 12/31/2022 07:30   DG Chest 2 View  Result Date: 12/30/2022 CLINICAL DATA:  Cough, dyspnea. EXAM: CHEST - 2 VIEW COMPARISON:  Chest radiograph dated 12/01/2022. FINDINGS: The heart size is borderline enlarged. Vascular calcifications are seen in the aortic arch. There is a moderate right pleural effusion with associated atelectasis/airspace disease. There is no left pleural effusion. There is mild left basilar atelectasis/airspace disease. Mild diffuse bilateral interstitial opacities are seen. No pneumothorax. IMPRESSION: Moderate right pleural effusion with associated atelectasis/airspace disease. Mild left basilar atelectasis/airspace disease. Mild diffuse bilateral interstitial  opacities may represent pulmonary edema. Electronically Signed   By: Romona Curls M.D.   On: 12/30/2022 09:26   US RENAL  Result Date: 12/03/2022 CLINICAL DATA:  Acute kidney injury EXAM: RENAL / URINARY TRACT ULTRASOUND COMPLETE COMPARISON:  CT abdomen and pelvis dated March 04, 2017 FINDINGS: Right Kidney: Renal measurements: 9.4 x 3.4 x 4.0 cm = volume: 66.2 mL. Echogenicity within normal limits. Duplicated system. Calcified cyst of the lower pole of the right kidney measuring 2.1 x 2.1 x 1.6 cm. No mass or hydronephrosis visualized. Left Kidney: Renal measurements: 9.8 x 4.7 x 3.6 cm = volume: 81.4 mL. Echogenicity within normal limits. No mass or hydronephrosis visualized. Multiple renal calculi, largest is located in the mid region and measures 7.2 mm. Bladder: Not well visualized, decompressed and contains a Foley catheter. Other: Bilateral pleural effusions. IMPRESSION: 1. No hydronephrosis. 2. Left renal calculi. 3. Cyst of the lower pole of the right kidney with calcifications, also previously seen on prior 2018 CT. 4. Bilateral pleural effusions. Electronically Signed   By: Allegra Lai M.D.   On: 12/03/2022 16:20    EKG: Atrial fibrillation at a rate of 116 bpm  ASSESSMENT AND PLAN:   1.  Acute on chronic HFrEF, with LVEF less than 25%, with probable medical noncompliance, patient admits to taking Entresto once a day, does not have consistent cardiology although up 2.  Coronary artery disease, status post NSTEMI 01/13/2020, status post stents LAD and left circumflex with residual occluded RCA, currently without chest pain 3.  Mildly elevated high-sensitivity troponin (203, 222) in the absence of chest pain, likely demand supply ischemia, secondary to acute on chronic HFrEF, with underlying CKD, stage IV 4.  Paroxysmal atrial fibrillation, not chronically anticoagulated, currently in sinus rhythm, on heparin infusion 5.  Diffuse large cell lymphoma in  remission  Recommendations  1.  Start furosemide 20 mg IV daily 2.  Start metoprolol succinate 12.5 mg twice daily 3.  Hold Entresto and ACE/ARB for now 4.  DC heparin 5.  Start low-dose Eliquis 2.5 mg twice daily  Signed: Marcina Millard MD,PhD, [facility] 12/31/2022, 2:21 PM

## 2022-12-31 NOTE — Consult Note (Signed)
Consultation Note Date: 12/31/2022   Patient Name: Ashley Lynch  DOB: 12/10/1945  MRN: [id number]  Age / Sex: 77 y.o., female  PCP: Margaretann Loveless, MD Referring Physician: Lurene Shadow, MD  Reason for Consultation: Establishing goals of care   HPI/Brief Hospital Course: 77 y.o. female  with past medical history of multivessel CAD s/p PCI and angioplasty, ischemic cardiomyopathy, chronic HFrEF with EF 25-30%, CKD stage 3b, PVD, HLD admitted from home on 12/30/2022 with shortness of breath and worsening LE edema.  Admitted and being treated for new onset A. Fib and CXR revealing pulmonary congestion  Noted recent admit last month for acute on chronic CHF with concern for medication non-adherence, appears [facility]. Broll [facility]   Palliative medicine was consulted for assisting with goals of care conversations.  Subjective:  Extensive chart review has been completed prior to meeting patient including labs, vital signs, imaging, progress notes, orders, and available advanced directive documents from current and previous encounters.  Visited with [name] at her bedside. Awake, alert, able to answer orientation questions including recalling days of the week backwards starting with Wednesday. Able to engage in goals of care conversations.  Introduced myself as a Publishing rights manager as a member of the palliative care team. Explained palliative medicine is specialized medical care for people living with serious illness. It focuses on providing relief from the symptoms and stress of a serious illness. The goal is to improve quality of life for both the patient and the family.   She is hesitant initially with proceeding but allows conversations to continue.  [name] shares she lives at home with her granddaughter-Bailey. She has a total of 3 daughters, [name] lives locally and is most involved. She worked for [employer] for many years prior to retirement. Prior  to admission, Ms. Delafuente shares she is able to get around her home but does require assistance with her ADL's from granddaughter.  She is able to share her understanding of current medical condition, aware she has a weakened heart. We discuss her medications, she shares she does on occasion not take her medications as prescribed, she complains that certain medications cause leg cramping which is why she chooses on occasion not to take them.  Attempted to elicit goals of care. We discuss her long term goals and decisions or thoughts she has had surrounding the end of her life. She share conversations such as these make her miss her husband who passed away several years ago and does not wish to continue these conversations. She does agree to PMT continuing to follow along and follow-up visits.  Objective: Primary Diagnoses: Present on Admission:  Acute systolic (congestive) heart failure (HCC)   Physical Exam Constitutional:      General: She is not in acute distress.    Appearance: She is ill-appearing.  Pulmonary:     Effort: Pulmonary effort is normal. No respiratory distress.  Skin:    General: Skin is warm and dry.  Neurological:     Mental Status: She is alert and oriented to person, place, and time.     Vital Signs: BP 139/81 (BP Location: Left Arm)   Pulse 69   Temp (!) 97.5 F (36.4 C) (Oral)   Resp 16   Ht 5\' 3"  (1.6 m)   Wt 55 kg   SpO2 99%   BMI 21.48 kg/m  Pain Scale: 0-10   Pain Score: 0-No pain  IO: Intake/output summary:  Intake/Output Summary (Last 24 hours) at 12/31/2022  1828 Last data filed at 12/31/2022 1653 Gross per 24 hour  Intake 388.71 ml  Output 600 ml  Net -211.29 ml    LBM: Last BM Date : 12/30/22 Baseline Weight: Weight: 49.9 kg Most recent weight: Weight: 55 kg       Palliative Assessment/Data: 65%    Thank you for this consult and allowing Palliative Medicine to participate in the care of Ashley Lynch. Palliative medicine will  continue to follow and assist as needed.   Time Total: 55 minutes  Time spent includes: Detailed review of medical records (labs, imaging, vital signs), medically appropriate exam (mental status, respiratory, cardiac, skin), discussed with treatment team, counseling and educating patient, family and staff, documenting clinical information, medication management and coordination of care.   Signed by: Leeanne Deed, DNP, AGNP-C Palliative Medicine    Please contact Palliative Medicine Team phone at [phone] for questions and concerns.  For individual provider: See Loretha Stapler

## 2023-01-01 DIAGNOSIS — I5023 Acute on chronic systolic (congestive) heart failure: Secondary | ICD-10-CM | POA: Diagnosis not present

## 2023-01-01 LAB — BASIC METABOLIC PANEL
Anion gap: 10 (ref 5–15)
BUN: 44 mg/dL — ABNORMAL HIGH (ref 8–23)
CO2: 19 mmol/L — ABNORMAL LOW (ref 22–32)
Calcium: 8 mg/dL — ABNORMAL LOW (ref 8.9–10.3)
Chloride: 111 mmol/L (ref 98–111)
Creatinine, Ser: 2.08 mg/dL — ABNORMAL HIGH (ref 0.44–1.00)
GFR, Estimated: 24 mL/min — ABNORMAL LOW (ref >60)
Glucose, Bld: 116 mg/dL — ABNORMAL HIGH (ref 70–99)
Potassium: 3.7 mmol/L (ref 3.5–5.1)
Sodium: 140 mmol/L (ref 135–145)

## 2023-01-01 LAB — CBC
HCT: 38.7 % (ref 36.0–46.0)
Hemoglobin: 12.1 g/dL (ref 12.0–15.0)
MCH: 30.2 pg (ref 26.0–34.0)
MCHC: 31.3 g/dL (ref 30.0–36.0)
MCV: 96.5 fL (ref 80.0–100.0)
Platelets: 112 10*3/uL — ABNORMAL LOW (ref 150–400)
RBC: 4.01 MIL/uL (ref 3.87–5.11)
RDW: 17.4 % — ABNORMAL HIGH (ref 11.5–15.5)
WBC: 8 10*3/uL (ref 4.0–10.5)
nRBC: 0 % (ref 0.0–0.2)

## 2023-01-01 LAB — MAGNESIUM: Magnesium: 2.3 mg/dL (ref 1.7–2.4)

## 2023-01-01 MED ORDER — PANTOPRAZOLE SODIUM 40 MG PO TBEC
40.0000 mg | DELAYED_RELEASE_TABLET | Freq: Every day | ORAL | Status: DC
  Administered 2023-01-02 – 2023-01-04 (×3): 40 mg via ORAL
  Filled 2023-01-01 (×3): qty 1

## 2023-01-01 MED ORDER — ATORVASTATIN CALCIUM 80 MG PO TABS
80.0000 mg | ORAL_TABLET | Freq: Every day | ORAL | Status: DC
  Administered 2023-01-01 – 2023-01-04 (×4): 80 mg via ORAL
  Filled 2023-01-01 (×4): qty 1

## 2023-01-01 NOTE — Progress Notes (Signed)
Speech Language Pathology Treatment: Dysphagia  Patient Details Name: Ashley Lynch MRN: [id number] DOB: 1946/03/17 Today's Date: 01/01/2023 Time: 3086-5784 SLP Time Calculation (min) (ACUTE ONLY): 8 min  Assessment / Plan / Recommendation Clinical Impression  Pt seen for diet tolerance. Session limited by pt's complaints of fatigue and politely declining POs. Pt lethargic, but able to rouse of POs. Mild dyspnea appreciated at rest. Improved with repositioning. Pt observed with trials of thin liquids via straw. No overt s/sx pharyngeal dysphagia; however, pt is at increased risk of aspiration/aspiration PNA given respiratory status, comorbidities, and overall deconditioned state. Pt politely declined further POs. Given limited nature of diet tolerance will f/u x1 for additional tx. Recommend continuation of current diet and safe swallowing strategies/aspiration precautions/reflux precautions in the interim.    HPI HPI: 77 y.o. female with medical history significant for CAD with severe multivessel disease, s/p PCI with stent angioplasty, PAD, nicotine dependence, ischemic cardiomyopathy with last known LVEF of 25% who presented to the ER for evaluation of worsening shortness of breath over the last 1 week associated with bilateral lower extremity swelling, orthopnea and intermittent right-sided chest pain.      SLP Plan  Continue with current plan of care      Recommendations for follow up therapy are one component of a multi-disciplinary discharge planning process, led by the attending physician.  Recommendations may be updated based on patient status, additional functional criteria and insurance authorization.    Recommendations  Diet recommendations: Dysphagia 3 (mechanical soft);Thin liquid Liquids provided via: Teaspoon;Cup;Straw Medication Administration: Whole meds with puree (vs crushed) Supervision: Patient able to self feed Compensations: Minimize environmental distractions;Slow  rate;Small sips/bites (rest breaks PRN; reflux precautions) Postural Changes and/or Swallow Maneuvers: Seated upright 90 degrees                  Oral care BID;Staff/trained caregiver to provide oral care   Intermittent Supervision/Assistance Dysphagia, unspecified (R13.10)     Continue with current plan of care    Clyde Canterbury, M.S., CCC-SLP Speech-Language Pathologist [facility] [phone] Arnette Felts)  Woodroe Chen  01/01/2023, 8:59 AM

## 2023-01-01 NOTE — Discharge Instructions (Signed)

## 2023-01-01 NOTE — Progress Notes (Signed)
  Interdisciplinary Goals of Care Family Meeting   Date carried out: 01/01/2023  Location of the meeting: Phone conference  Member's involved: Physician and Family Member or next of kin  Durable Power of Attorney or acting medical decision maker: Barrie Folk     Discussion: We discussed goals of care for [name] . Clydie Braun said patient has lost the will to live.  She said from the conversation she's had with the patient, it appears she does not have much time.  We discussed diagnoses, prognosis and goals of care.  She confirmed that patient is DNR.  We talked about hospice and the fact that she has qualifying diagnoses that will make her eligible for hospice care.  She said patient does not want to be put into hospice house and would not want hospice at home either.  She said she had mentioned that she will throw them out of her house.  She does not want patient to sign out AGAINST MEDICAL ADVICE and was patient to be formally discharged when medically ready.  She requested to speak to the palliative care team.  Ladona Ridgel, NP, with palliative care team has been notified.  Code status:   Code Status: Limited: Do not attempt resuscitation (DNR) -DNR-LIMITED -Do Not Intubate/DNI    Disposition: Home  Time spent for the meeting: 10 minutes    Lurene Shadow, MD  01/01/2023, 1:45 PM

## 2023-01-01 NOTE — Progress Notes (Signed)
Progress Note    [name]  SWF:[id number] DOB: Mar 15, 1946  DOA: 12/30/2022 PCP: Margaretann Loveless, MD      Brief Narrative:    Medical records reviewed and are as summarized below:  Ashley Lynch is a 77 y.o. female with medical history significant for CAD, PVD, chronic systolic CHF with EF of 25%, CKD stage IV, diffuse large cell lymphoma in remission, hypertension, recent hospitalization for CHF exacerbation from 11/30/2022 through 12/03/2022 (left AMA on that visit).  She presented to the hospital because of generalized weakness, fatigue, shortness of breath, lower extremity swelling, slow and fast heart rate and decreased oxygen saturation.  Her daughter said that her pulse at home was low in the 50s and then it later jumped into the 140s.  Apparently, oxygen saturation dropped into the 80s and she required 2 L of oxygen to bring Her oxygen saturation to 91%.  It is also reported that patient has not been medically adherent and has not been taking her diuretics.  Her heart rate was in the 140s in the emergency department.  She was found to have atrial fibrillation with rapid ventricular response and CHF exacerbation.  She was treated with IV metoprolol and digoxin.  Heart rate went down to into the 40s.  She eventually converted to normal sinus rhythm.       Assessment/Plan:   Principal Problem:   CHF (congestive heart failure) (HCC) Active Problems:   Acute systolic (congestive) heart failure (HCC)   A-fib (HCC)    Body mass index is 21.87 kg/m.   Acute on chronic systolic CHF: Continue IV  Lasix per cardiologist. Monitor BMP, daily weight and urine output. 2D echo on 12/01/2022 showed EF estimated at 25%, moderate to severe mitral regurgitation.   Paroxysmal atrial fibrillation with RVR, tachybradycardia syndrome, sinus bradycardia: She is on metoprolol XL 12.5 mg twice daily.  Monitor heart rate closely.  She had been started on Eliquis.  Follow-up with  cardiologist.   Hypokalemia improved   CKD stage IV, metabolic acidosis: Patient had CKD stage IIIb but it appears she has progressed to CKD stage IV.  Monitor BMP closely.   CAD, PVD: Continue statin. She's off of Plavix   History of diffuse large cell lymphoma in remission   Medical nonadherence   Plan of care discussed with Clydie Braun, daughter and HPOA, over the phone.   Diet Order             DIET DYS 3 Room service appropriate? Yes; Fluid consistency: Thin  Diet effective now                            Consultants: Cardiologist  Procedures: None    Medications:    apixaban  2.5 mg Oral BID   atorvastatin  80 mg Oral Daily   furosemide  20 mg Intravenous Daily   metoprolol succinate  12.5 mg Oral BID AC   nicotine  14 mg Transdermal Daily   sodium chloride flush  3 mL Intravenous Q12H   Continuous Infusions:  sodium chloride       Anti-infectives (From admission, onward)    Start     Dose/Rate Route Frequency Ordered Stop   12/30/22 1100  cefTRIAXone (ROCEPHIN) 2 g in sodium chloride 0.9 % 100 mL IVPB  Status:  Discontinued        2 g 200 mL/hr over 30 Minutes Intravenous Every 24 hours 12/30/22 1059  12/30/22 1134              Family Communication/Anticipated D/C date and plan/Code Status   DVT prophylaxis: apixaban (ELIQUIS) tablet 2.5 mg Start: 12/31/22 1530 apixaban (ELIQUIS) tablet 2.5 mg     Code Status: Limited: Do not attempt resuscitation (DNR) -DNR-LIMITED -Do Not Intubate/DNI   Family Communication: Karen,daughter, over the phone Disposition Plan: Plan to discharge home   Status is: Inpatient Remains inpatient appropriate because: CHF exacerbation       Subjective:   Interval events noted.  She complains of shortness of breath.  No chest pain.  Objective:    Vitals:   01/01/23 0333 01/01/23 0424 01/01/23 0801 01/01/23 1215  BP: 118/66  (!) 144/88 (!) 140/76  Pulse: (!) 47  62 65  Resp: 18  16 16    Temp: 98.2 F (36.8 C)  (!) 97.4 F (36.3 C) 97.7 F (36.5 C)  TempSrc: Oral  Oral Oral  SpO2: 99%  100% 94%  Weight:  56 kg    Height:       No data found.   Intake/Output Summary (Last 24 hours) at 01/01/2023 1310 Last data filed at 01/01/2023 0524 Gross per 24 hour  Intake 94.09 ml  Output 1000 ml  Net -905.91 ml   Filed Weights   12/30/22 0854 12/31/22 0501 01/01/23 0424  Weight: 49.9 kg 55 kg 56 kg    Exam:  GEN: NAD SKIN: Warm and dry EYES: No pallor or icterus ENT: MMM CV: RRR PULM: Bibasilar rales, no wheezing ABD: soft, ND, NT, +BS CNS: AAO x 3, non focal EXT: Bilateral leg edema, no tenderness    Data Reviewed:   I have personally reviewed following labs and imaging studies:  Labs: Labs show the following:   Basic Metabolic Panel: Recent Labs  Lab 12/30/22 0857 12/30/22 1127 12/31/22 0428 01/01/23 0538  NA 143  --  144 140  K 3.2*  --  4.2 3.7  CL 112*  --  114* 111  CO2 19*  --  19* 19*  GLUCOSE 115*  --  101* 116*  BUN 44*  --  41* 44*  CREATININE 2.02*  --  2.10* 2.08*  CALCIUM 8.6*  --  8.5* 8.0*  MG  --  2.0  --  2.3  PHOS  --  3.3  --   --    GFR Estimated Creatinine Clearance: 18.7 mL/min (A) (by C-G formula based on SCr of 2.08 mg/dL (H)). Liver Function Tests: Recent Labs  Lab 12/30/22 0857  AST 20  ALT 15  ALKPHOS 137*  BILITOT 0.8  PROT 6.3*  ALBUMIN 3.2*   Recent Labs  Lab 12/30/22 0857  LIPASE 20   No results for input(s): "AMMONIA" in the last 168 hours. Coagulation profile Recent Labs  Lab 12/30/22 0858  INR 1.3*    CBC: Recent Labs  Lab 12/30/22 0857 12/31/22 0428 01/01/23 0538  WBC 8.6 7.9 8.0  NEUTROABS 6.6  --   --   HGB 12.3 12.8 12.1  HCT 38.5 39.9 38.7  MCV 92.5 93.7 96.5  PLT 124* 109* 112*   Cardiac Enzymes: No results for input(s): "CKTOTAL", "CKMB", "CKMBINDEX", "TROPONINI" in the last 168 hours. BNP (last 3 results) No results for input(s): "PROBNP" in the last 8760  hours. CBG: No results for input(s): "GLUCAP" in the last 168 hours. D-Dimer: No results for input(s): "DDIMER" in the last 72 hours. Hgb A1c: No results for input(s): "HGBA1C" in the last 72 hours.  Lipid Profile: No results for input(s): "CHOL", "HDL", "LDLCALC", "TRIG", "CHOLHDL", "LDLDIRECT" in the last 72 hours. Thyroid function studies: No results for input(s): "TSH", "T4TOTAL", "T3FREE", "THYROIDAB" in the last 72 hours.  Invalid input(s): "FREET3" Anemia work up: No results for input(s): "VITAMINB12", "FOLATE", "FERRITIN", "TIBC", "IRON", "RETICCTPCT" in the last 72 hours. Sepsis Labs: Recent Labs  Lab 12/30/22 0857 12/30/22 1127 12/31/22 0428 01/01/23 0538  WBC 8.6  --  7.9 8.0  LATICACIDVEN 2.0* 1.8  --   --     Microbiology Recent Results (from the past 240 hour(s))  SARS Coronavirus 2 by RT PCR (hospital order, performed in [facility] hospital lab) *cepheid single result test* Anterior Nasal Swab     Status: None   Collection Time: 12/30/22  9:23 AM   Specimen: Anterior Nasal Swab  Result Value Ref Range Status   SARS Coronavirus 2 by RT PCR NEGATIVE NEGATIVE Final    Comment: (NOTE) SARS-CoV-2 target nucleic acids are NOT DETECTED.  The SARS-CoV-2 RNA is generally detectable in upper and lower respiratory specimens during the acute phase of infection. The lowest concentration of SARS-CoV-2 viral copies this assay can detect is 250 copies / mL. A negative result does not preclude SARS-CoV-2 infection and should not be used as the sole basis for treatment or other patient management decisions.  A negative result may occur with improper specimen collection / handling, submission of specimen other than nasopharyngeal swab, presence of viral mutation(s) within the areas targeted by this assay, and inadequate number of viral copies (<250 copies / mL). A negative result must be combined with clinical observations, patient history, and epidemiological  information.  Fact Sheet for Patients:   RoadLapTop.co.za  Fact Sheet for Healthcare Providers: http://kim-miller.com/  This test is not yet approved or  cleared by the [place] FDA and has been authorized for detection and/or diagnosis of SARS-CoV-2 by FDA under an Emergency Use Authorization (EUA).  This EUA will remain in effect (meaning this test can be used) for the duration of the COVID-19 declaration under Section 564(b)(1) of the Act, 21 U.S.C. section 360bbb-3(b)(1), unless the authorization is terminated or revoked sooner.  Performed at [facility], [address] [place], [place] [address]   Blood culture (routine x 2)     Status: None (Preliminary result)   Collection Time: 12/30/22 11:27 AM   Specimen: BLOOD  Result Value Ref Range Status   Specimen Description BLOOD LEFT ANTECUBITAL  Final   Special Requests   Final    BOTTLES DRAWN AEROBIC AND ANAEROBIC Blood Culture adequate volume   Culture   Final    NO GROWTH 2 DAYS Performed at [facility], [address] [place], [place] [address]    Report Status PENDING  Incomplete  Blood culture (routine x 2)     Status: None (Preliminary result)   Collection Time: 12/30/22 11:27 AM   Specimen: BLOOD  Result Value Ref Range Status   Specimen Description BLOOD RIGHT ANTECUBITAL  Final   Special Requests   Final    BOTTLES DRAWN AEROBIC AND ANAEROBIC Blood Culture adequate volume   Culture   Final    NO GROWTH 2 DAYS Performed at [facility], [address], [place], [place] [address]    Report Status PENDING  Incomplete    Procedures and diagnostic studies:  DG Chest 1 View  Result Date: 12/31/2022 CLINICAL DATA:  Congestive heart failure. EXAM: CHEST  1 VIEW COMPARISON:  12/30/2022 FINDINGS: The cardio  pericardial silhouette is enlarged. Bibasilar collapse/consolidation noted, right greater than left with moderate right pleural  effusion. Diffuse interstitial opacity likely chronic although superimposed component of interstitial edema not excluded. Bones are diffusely demineralized. Telemetry leads overlie the chest. IMPRESSION: 1. No substantial change. 2. Bibasilar collapse/consolidation, right greater than left with moderate right pleural effusion. 3. Diffuse interstitial opacity likely chronic although superimposed component of interstitial edema not excluded. Electronically Signed   By: Kennith Center M.D.   On: 12/31/2022 07:30               LOS: 2 days   Aleksia Freiman  [facility]   Pager on [email or web address]. If 7PM-7AM, please contact night-coverage at [email or web address]     01/01/2023, 1:10 PM

## 2023-01-01 NOTE — Progress Notes (Signed)
[facility] Cardiology  CARDIOLOGY PROGRESS NOTE  Patient ID: Ashley Lynch MRN: [id number] DOB/AGE: [birth year] 77 y.o.  Admit date: 12/30/2022 Referring Physician [facility] Primary Physician  Primary Cardiologist  Reason for Consultation congestive heart failure  HPI: 77 year old female referred for evaluation of congestive heart failure.  Patient has history of coronary artery disease, ischemic cardiomyopathy, and chronic HFrEF.  She also has history of chronic kidney disease stage IV, and diffuse large cell lymphoma in remission.  She was recently hospitalized for acute HFrEF 11/30/2022 and left AMA on 12/03/2022.  Reviewing outside records, the patient has not had consistent cardiology follow-up in the past 1 to 2 years.  Patient's daughter and granddaughter in the room.  Apparently the patient is unable to get up off the floor, called EMS, and was brought to [facility] ED primarily for tachycardia, where ECG revealed atrial fibrillation with rapid ventricular rate.  The patient was treated with intravenous metoprolol tartrate and digoxin, and the patient converted to sinus rhythm, and remains in sinus rhythm at 77 bpm.  Upon questioning, patient denies chest pain.  Troponin mildly elevated 203, 222 without ischemic ECG changes.  The patient has a history of NSTEMI 01/13/2020, was transferred to [facility] where she underwent PCI with stents to LAD and left circumflex, with residual chronically occluded RCA.  Most recent 2D echocardiogram 12/01/2022 revealed LVEF 25%.  AMP greater than 4500.  Interval history: -Patient reports she is not feeling well today, but is unable to elaborate on specific symptoms. -Endorses SOB but states this does not improve with supplemental oxygen.  -Daughter reports patient does not like going to the doctor or taking medications at home. Palliative care is following patient.   Review of systems complete and found to be negative unless listed above     Past Medical History:  Diagnosis Date    Cancer (HCC)    lymphoma   Cardiomyopathy (HCC)    Complication of anesthesia    slow to wake   H/O heart artery stent 12/2019   X2   HFrEF (heart failure with reduced ejection fraction) (HCC)    History of chicken pox    History of measles, mumps, or rubella    HTN (hypertension)    Iliac artery occlusion (HCC)    Bilateral   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)    Myocardial infarction (HCC) 04/23/2021   NSTEMI   PAD (peripheral artery disease) [facility])     Past Surgical History:  Procedure Laterality Date   ABDOMINAL HYSTERECTOMY  04/18/2011   CARDIAC CATHETERIZATION  04/25/2021   CATARACT EXTRACTION W/PHACO Left 09/20/2021   Procedure: CATARACT EXTRACTION PHACO AND INTRAOCULAR LENS PLACEMENT (IOC) LEFT  18.07 01:25.3;  Surgeon: Galen Manila, MD;  Location: [facility];  Service: Ophthalmology;  Laterality: Left;   CATARACT EXTRACTION W/PHACO Right 10/04/2021   Procedure: CATARACT EXTRACTION PHACO AND INTRAOCULAR LENS PLACEMENT (IOC) RIGHT  5.68 00:44.8;  Surgeon: Galen Manila, MD;  Location: [facility];  Service: Ophthalmology;  Laterality: Right;   GALLBLADDER SURGERY  1975 1981   LEFT HEART CATH AND CORONARY ANGIOGRAPHY N/A 01/13/2020   Procedure: LEFT HEART CATH AND CORONARY ANGIOGRAPHY;  Surgeon: Laurier Nancy, MD;  Location: [facility] INVASIVE CV LAB;  Service: Cardiovascular;  Laterality: N/A;   TONSILLECTOMY  04/17/2008    Medications Prior to Admission  Medication Sig Dispense Refill Last Dose   aspirin 81 MG tablet Take 81 mg by mouth daily. (Patient not taking: Reported on 09/19/2021)      atorvastatin (LIPITOR)  80 MG tablet Take 80 mg by mouth. (Patient not taking: Reported on 09/19/2021)      clopidogrel (PLAVIX) 75 MG tablet Take 1 tablet (75 mg total) by mouth daily. (Patient not taking: Reported on 12/30/2022) 30 tablet 1 Not Taking   furosemide (LASIX) 20 MG tablet TAKE 1 TABLET BY MOUTH EVERY DAY (Patient not taking: Reported on 12/30/2022) 30 tablet 1  Not Taking   GEMTESA 75 MG TABS Take 75 mg by mouth daily. (Patient not taking: Reported on 12/30/2022)   Not Taking   metoprolol succinate (TOPROL-XL) 25 MG 24 hr tablet Take 1 tablet (25 mg total) by mouth daily. (Patient not taking: Reported on 12/30/2022) 30 tablet 1 Not Taking   Multiple Vitamin (MULTIVITAMIN WITH MINERALS) TABS tablet Take 1 tablet by mouth daily. (Patient not taking: Reported on 12/30/2022) 30 tablet 1 Not Taking   NICODERM CQ 14 MG/24HR patch Place 1 patch onto the skin daily.      nitroGLYCERIN (NITROSTAT) 0.4 MG SL tablet Place 0.4 tablets under the tongue daily as needed. (Patient not taking: Reported on 12/30/2022)   Not Taking   omeprazole (PRILOSEC) 20 MG capsule Take 1 capsule (20 mg total) by mouth daily. (Patient not taking: Reported on 12/30/2022) 90 capsule 3 Not Taking   sacubitril-valsartan (ENTRESTO) 24-26 MG Take 1 tablet by mouth 2 (two) times daily. (Patient not taking: Reported on 12/30/2022) 60 tablet 1 Not Taking   sertraline (ZOLOFT) 50 MG tablet TAKE 1 TABLET BY MOUTH EVERY DAY (Patient not taking: Reported on 12/30/2022) 90 tablet 2 Not Taking   traMADol (ULTRAM) 50 MG tablet Take 1 tablet (50 mg total) by mouth 3 (three) times daily as needed. (Patient not taking: Reported on 12/30/2022) 30 tablet 0 Not Taking   Social History   Socioeconomic History   Marital status: Widowed    Spouse name: Not on file   Number of children: Not on file   Years of education: Not on file   Highest education level: Not on file  Occupational History   Not on file  Tobacco Use   Smoking status: Every Day    Current packs/day: 1.00    Average packs/day: 1 pack/day for 60.0 years (60.0 ttl pk-yrs)    Types: Cigarettes   Smokeless tobacco: Never  Vaping Use   Vaping status: Never Used  Substance and Sexual Activity   Alcohol use: No   Drug use: No   Sexual activity: Not on file  Other Topics Concern   Not on file  Social History Narrative   Not on file   Social  Determinants of Health   Financial Resource Strain: Low Risk  (04/24/2021)   [facility] from [facility]   Overall Financial Resource Strain (CARDIA)    Difficulty of Paying Living Expenses: Not hard at all  Food Insecurity: No Food Insecurity (12/01/2022)   Hunger Vital Sign    Worried About Running Out of Food in the Last Year: Never true    Ran Out of Food in the Last Year: Never true  Transportation Needs: No Transportation Needs (12/01/2022)   PRAPARE - Administrator, Civil Service (Medical): No    Lack of Transportation (Non-Medical): No  Physical Activity: Not on file  Stress: Not on file  Social Connections: Not on file  Intimate Partner Violence: Not At Risk (12/01/2022)   Humiliation, Afraid, Rape, and Kick questionnaire    Fear of Current or Ex-Partner: No  Emotionally Abused: No    Physically Abused: No    Sexually Abused: No    Family History  Problem Relation Age of Onset   Diabetes Mother        sibling and grandparent also      Review of systems complete and found to be negative unless listed above      PHYSICAL EXAM General: Chronically-ill appearing, in no acute distress resting in hospital bed with daughter and granddaughter present at bedside. HEENT:  Normocephalic and atramatic Neck:  No JVD.  Lungs: Clear bilaterally to auscultation and percussion. Heart: HRRR . Normal S1 and S2 without gallops or murmurs.  Abdomen: Bowel sounds are positive, abdomen soft and non-tender  Msk:  Back normal, normal gait. Normal strength and tone for age. Extremities: No clubbing, cyanosis or edema.   Neuro: Alert and oriented X 3. Psych:  Good affect, responds appropriately  Labs:   Lab Results  Component Value Date   WBC 8.0 01/01/2023   HGB 12.1 01/01/2023   HCT 38.7 01/01/2023   MCV 96.5 01/01/2023   PLT 112 (L) 01/01/2023    Recent Labs  Lab 12/30/22 0857 12/31/22 0428 01/01/23 0538  NA 143   < > 140  K 3.2*   < > 3.7   CL 112*   < > 111  CO2 19*   < > 19*  BUN 44*   < > 44*  CREATININE 2.02*   < > 2.08*  CALCIUM 8.6*   < > 8.0*  PROT 6.3*  --   --   BILITOT 0.8  --   --   ALKPHOS 137*  --   --   ALT 15  --   --   AST 20  --   --   GLUCOSE 115*   < > 116*   < > = values in this interval not displayed.   Lab Results  Component Value Date   TROPONINI <0.03 06/28/2017    Lab Results  Component Value Date   CHOL 130 01/13/2020   CHOL 180 06/29/2017   CHOL 190 06/19/2016   Lab Results  Component Value Date   HDL 43 01/13/2020   HDL 36 (L) 06/29/2017   HDL 35 (L) 06/19/2016   Lab Results  Component Value Date   LDLCALC 70 01/13/2020   LDLCALC 109 (H) 06/29/2017   LDLCALC 127 (H) 06/19/2016   Lab Results  Component Value Date   TRIG 83 01/13/2020   TRIG 176 (H) 06/29/2017   TRIG 141 06/19/2016   Lab Results  Component Value Date   CHOLHDL 3.0 01/13/2020   CHOLHDL 5.0 06/29/2017   CHOLHDL 5.4 (H) 06/19/2016   No results found for: "LDLDIRECT"    Radiology: DG Chest 1 View  Result Date: 12/31/2022 CLINICAL DATA:  Congestive heart failure. EXAM: CHEST  1 VIEW COMPARISON:  12/30/2022 FINDINGS: The cardio pericardial silhouette is enlarged. Bibasilar collapse/consolidation noted, right greater than left with moderate right pleural effusion. Diffuse interstitial opacity likely chronic although superimposed component of interstitial edema not excluded. Bones are diffusely demineralized. Telemetry leads overlie the chest. IMPRESSION: 1. No substantial change. 2. Bibasilar collapse/consolidation, right greater than left with moderate right pleural effusion. 3. Diffuse interstitial opacity likely chronic although superimposed component of interstitial edema not excluded. Electronically Signed   By: Kennith Center M.D.   On: 12/31/2022 07:30   DG Chest 2 View  Result Date: 12/30/2022 CLINICAL DATA:  Cough, dyspnea. EXAM: CHEST - 2 VIEW COMPARISON:  Chest radiograph dated 12/01/2022. FINDINGS:  The heart size is borderline enlarged. Vascular calcifications are seen in the aortic arch. There is a moderate right pleural effusion with associated atelectasis/airspace disease. There is no left pleural effusion. There is mild left basilar atelectasis/airspace disease. Mild diffuse bilateral interstitial opacities are seen. No pneumothorax. IMPRESSION: Moderate right pleural effusion with associated atelectasis/airspace disease. Mild left basilar atelectasis/airspace disease. Mild diffuse bilateral interstitial opacities may represent pulmonary edema. Electronically Signed   By: Romona Curls M.D.   On: 12/30/2022 09:26   US RENAL  Result Date: 12/03/2022 CLINICAL DATA:  Acute kidney injury EXAM: RENAL / URINARY TRACT ULTRASOUND COMPLETE COMPARISON:  CT abdomen and pelvis dated March 04, 2017 FINDINGS: Right Kidney: Renal measurements: 9.4 x 3.4 x 4.0 cm = volume: 66.2 mL. Echogenicity within normal limits. Duplicated system. Calcified cyst of the lower pole of the right kidney measuring 2.1 x 2.1 x 1.6 cm. No mass or hydronephrosis visualized. Left Kidney: Renal measurements: 9.8 x 4.7 x 3.6 cm = volume: 81.4 mL. Echogenicity within normal limits. No mass or hydronephrosis visualized. Multiple renal calculi, largest is located in the mid region and measures 7.2 mm. Bladder: Not well visualized, decompressed and contains a Foley catheter. Other: Bilateral pleural effusions. IMPRESSION: 1. No hydronephrosis. 2. Left renal calculi. 3. Cyst of the lower pole of the right kidney with calcifications, also previously seen on prior 2018 CT. 4. Bilateral pleural effusions. Electronically Signed   By: Allegra Lai M.D.   On: 12/03/2022 16:20    EKG: Atrial fibrillation at a rate of 116 bpm  Telemetry reviewed (01/01/23): atrial fibrillation rate 60s, short paroxysms up to 140s and down to 40s  Data reviewed (01/01/23): last 24h vitals tele labs imaging I/O hospitalist progress note, palliative care  note  ASSESSMENT AND PLAN:   1.  Acute on chronic HFrEF, with LVEF less than 25%, with probable medical noncompliance, patient admits to taking Entresto once a day, does not have consistent cardiology although up 2.  Coronary artery disease, status post NSTEMI 01/13/2020, status post stents LAD and left circumflex with residual occluded RCA, currently without chest pain 3.  Mildly elevated high-sensitivity troponin (203, 222) in the absence of chest pain, likely demand supply ischemia, secondary to acute on chronic HFrEF, with underlying CKD, stage IV 4.  Paroxysmal atrial fibrillation, not chronically anticoagulated 5.  Diffuse large cell lymphoma in remission 6. Medical noncompliance, per patient's daughter she often does not take her medications and does not like to go to doctors appointments.  Recommendations  1.  Continue furosemide 20 mg IV daily 2.  Continue metoprolol succinate 12.5 mg twice daily 3.  Hold Entresto and ACE/ARB for now 4.  Continue low-dose Eliquis 2.5 mg twice daily (dose appropriate given weight <60 kg and Cr >1.5) 5.  Palliative medicine is following - further conversations will be beneficial given patient's chronic conditions and her preference to not take medications/follow in clinic regularly  This patient's plan of care was discussed and created with Dr. Melton Alar and she is in agreement.    SignedGale Journey, PA-C  01/01/2023, 12:07 PM

## 2023-01-02 DIAGNOSIS — Z7189 Other specified counseling: Secondary | ICD-10-CM | POA: Diagnosis not present

## 2023-01-02 DIAGNOSIS — I5023 Acute on chronic systolic (congestive) heart failure: Secondary | ICD-10-CM | POA: Diagnosis not present

## 2023-01-02 DIAGNOSIS — Z515 Encounter for palliative care: Secondary | ICD-10-CM | POA: Diagnosis not present

## 2023-01-02 LAB — BASIC METABOLIC PANEL
Anion gap: 10 (ref 5–15)
BUN: 45 mg/dL — ABNORMAL HIGH (ref 8–23)
CO2: 20 mmol/L — ABNORMAL LOW (ref 22–32)
Calcium: 8 mg/dL — ABNORMAL LOW (ref 8.9–10.3)
Chloride: 110 mmol/L (ref 98–111)
Creatinine, Ser: 2.1 mg/dL — ABNORMAL HIGH (ref 0.44–1.00)
GFR, Estimated: 24 mL/min — ABNORMAL LOW (ref >60)
Glucose, Bld: 133 mg/dL — ABNORMAL HIGH (ref 70–99)
Potassium: 3.1 mmol/L — ABNORMAL LOW (ref 3.5–5.1)
Sodium: 140 mmol/L (ref 135–145)

## 2023-01-02 MED ORDER — TRAMADOL HCL 50 MG PO TABS
50.0000 mg | ORAL_TABLET | Freq: Four times a day (QID) | ORAL | Status: AC | PRN
  Administered 2023-01-02 (×2): 50 mg via ORAL
  Filled 2023-01-02 (×2): qty 1

## 2023-01-02 MED ORDER — POTASSIUM CHLORIDE CRYS ER 20 MEQ PO TBCR
40.0000 meq | EXTENDED_RELEASE_TABLET | Freq: Once | ORAL | Status: AC
  Administered 2023-01-02: 40 meq via ORAL
  Filled 2023-01-02: qty 2

## 2023-01-02 MED ORDER — POTASSIUM CHLORIDE 10 MEQ/100ML IV SOLN
10.0000 meq | INTRAVENOUS | Status: AC
  Administered 2023-01-02: 10 meq via INTRAVENOUS
  Filled 2023-01-02 (×2): qty 100

## 2023-01-02 NOTE — Progress Notes (Signed)
OT Cancellation Note  Patient Details Name: Ashley Lynch MRN: [id number] DOB: 1945/09/03   Cancelled Treatment:    Reason Eval/Treat Not Completed: Patient declined, no reason specified;Other (comment) (Pt recieved in sidelying, family present in room. Pt declining participation in OT - states "I want you to leave now". OT offers ADL tasks with pt refusing. OT will re-attempt as able.) Victorino Dike L. Hobart Marte, OTR/L  01/02/23, 1:12 PM

## 2023-01-02 NOTE — Care Management Important Message (Signed)
Important Message  Patient Details  Name: Ashley Lynch MRN: [id number] Date of Birth: Sep 09, 1945   Medicare Important Message Given:  Yes     [name] 01/02/2023, 10:10 AM

## 2023-01-02 NOTE — Progress Notes (Signed)
Speech Language Pathology Treatment: Dysphagia  Patient Details Name: Ashley Lynch MRN: [id number] DOB: 06/07/1945 Today's Date: 01/02/2023 Time: 7829-5621 SLP Time Calculation (min) (ACUTE ONLY): 8 min  Assessment / Plan / Recommendation Clinical Impression  Pt seen for diet tolerance. Upon SLP entrance to room, pt consuming breakfast including oatmeal, scrambled eggs, and thin liquids (via cup and straw). Pt observed with items from meal tray. Pt demonstrated an intact oral swallow for consistencies on meal tray. Pharyngeal swallow also appeared [facility] with no overt s/sx pharyngeal dysphagia. Pt easily SOB with exertion and pt indep taking rest breaks as needed. Reviewed diet rec's, safe swallowing strategies, aspiration precautions, relationship between breathing/swallowing,a and SLP POC with pt and daughter. Both verbalized understanding/agreement. SLP to sign off as pt has no acute SLP needs at this time.    HPI HPI: 77 y.o. female with medical history significant for CAD with severe multivessel disease, s/p PCI with stent angioplasty, PAD, nicotine dependence, ischemic cardiomyopathy with last known LVEF of 25% who presented to the ER for evaluation of worsening shortness of breath over the last 1 week associated with bilateral lower extremity swelling, orthopnea and intermittent right-sided chest pain.      SLP Plan  All goals met      Recommendations for follow up therapy are one component of a multi-disciplinary discharge planning process, led by the attending physician.  Recommendations may be updated based on patient status, additional functional criteria and insurance authorization.    Recommendations  Diet recommendations: Dysphagia 3 (mechanical soft);Thin liquid Liquids provided via: Teaspoon;Cup;Straw Medication Administration: Whole meds with puree (vs crushed) Supervision: Patient able to self feed Compensations: Minimize environmental distractions;Slow rate;Small  sips/bites Postural Changes and/or Swallow Maneuvers: Seated upright 90 degrees;Out of bed for meals;Upright 30-60 min after meal                  Oral care BID;Staff/trained caregiver to provide oral care   Intermittent Supervision/Assistance Dysphagia, unspecified (R13.10)     All goals met    Clyde Canterbury, M.S., CCC-SLP Speech-Language Pathologist [facility] [phone] (ASCOM)  Woodroe Chen  01/02/2023, 10:40 AM

## 2023-01-02 NOTE — Progress Notes (Signed)
   01/02/23 1400  Spiritual Encounters  Type of Visit Initial  Care provided to: Pt and family  Referral source Chaplain assessment  Reason for visit Routine spiritual support  OnCall Visit No  Spiritual Framework  Presenting Themes Meaning/purpose/sources of inspiration  Community/Connection Family  Patient Stress Factors Health changes  Family Stress Factors None identified  Interventions  Spiritual Care Interventions Made Decision-making support/facilitation  Intervention Outcomes  Outcomes Awareness of health  Spiritual Care Plan  Spiritual Care Issues Still Outstanding No further spiritual care needs at this time (see row info)   Chaplain went to speak with patient and family about advance directive. Chaplain went to get a notary and witnesses and both parties were not available. Daughter of the patient told me that she knows a Conservation officer, nature and get the paper notarized. Patient and family thanked chaplain for helping.

## 2023-01-02 NOTE — Progress Notes (Addendum)
Progress Note    Ashley Lynch  NWG:[id number] DOB: [birth year]  DOA: 12/30/2022 PCP: Margaretann Loveless, MD      Brief Narrative:    Medical records reviewed and are as summarized below:  Ashley Lynch is a 77 y.o. female with medical history significant for CAD, PVD, chronic systolic CHF with EF of 25%, CKD stage IV, diffuse large cell lymphoma in remission, hypertension, recent hospitalization for CHF exacerbation from 11/30/2022 through 12/03/2022 (left AMA on that visit).  She presented to the hospital because of generalized weakness, fatigue, shortness of breath, lower extremity swelling, slow and fast heart rate and decreased oxygen saturation.  Her daughter said that her pulse at home was low in the 50s and then it later jumped into the 140s.  Apparently, oxygen saturation dropped into the 80s and she required 2 L of oxygen to bring Her oxygen saturation to 91%.  It is also reported that patient has not been medically adherent and has not been taking her diuretics.  Her heart rate was in the 140s in the emergency department.  She was found to have atrial fibrillation with rapid ventricular response and CHF exacerbation.  She was treated with IV metoprolol and digoxin.  Heart rate went down to into the 40s.  She eventually converted to normal sinus rhythm.       Assessment/Plan:   Principal Problem:   CHF (congestive heart failure) (HCC) Active Problems:   Acute systolic (congestive) heart failure (HCC)   A-fib (HCC)    Body mass index is 21.64 kg/m.   Acute on chronic systolic CHF: She is still on IV Lasix.  Monitor BMP, daily weight and urine output. 2D echo on 12/01/2022 showed EF estimated at 25%, moderate to severe mitral regurgitation.   Paroxysmal atrial fibrillation with RVR, tachybradycardia syndrome, sinus bradycardia: Heart rate is better.  She is on metoprolol XL 12.5 mg twice daily.  Continue Eliquis.  Follow-up with cardiologist.   Hypokalemia: Replete  potassium and monitor levels.   CKD stage IV, metabolic acidosis: Patient had CKD stage IIIb but it appears she has progressed to CKD stage IV.  Monitor BMP closely.   CAD, PVD: She is off of Plavix.  Continue statins.   History of diffuse large cell lymphoma in remission   Medical nonadherence   Plan of care was discussed with [name], daughter, at the bedside.  Patient and [name] said that they do not want Clydie Braun to be contacted.  They are working on legal documents to remove HPOA. I have passed on this information to  Helmut Muster, NP, with palliative care team and Caralyn, [place], with cardiology team.   Diet Order             DIET DYS 3 Room service appropriate? Yes; Fluid consistency: Thin  Diet effective now                            Consultants: Cardiologist  Procedures: None    Medications:    apixaban  2.5 mg Oral BID   atorvastatin  80 mg Oral Daily   furosemide  20 mg Intravenous Daily   metoprolol succinate  12.5 mg Oral BID AC   nicotine  14 mg Transdermal Daily   pantoprazole  40 mg Oral Daily   sodium chloride flush  3 mL Intravenous Q12H   Continuous Infusions:  sodium chloride       Anti-infectives (From admission, onward)  Start     Dose/Rate Route Frequency Ordered Stop   12/30/22 1100  cefTRIAXone (ROCEPHIN) 2 g in sodium chloride 0.9 % 100 mL IVPB  Status:  Discontinued        2 g 200 mL/hr over 30 Minutes Intravenous Every 24 hours 12/30/22 1059 12/30/22 1134              Family Communication/Anticipated D/C date and plan/Code Status   DVT prophylaxis: apixaban (ELIQUIS) tablet 2.5 mg Start: 12/31/22 1530 apixaban (ELIQUIS) tablet 2.5 mg     Code Status: Limited: Do not attempt resuscitation (DNR) -DNR-LIMITED -Do Not Intubate/DNI   Family Communication: Plan discussed with Steward Drone, daughter, over the phone Disposition Plan: Plan to discharge home   Status is: Inpatient Remains inpatient appropriate because: CHF  exacerbation       Subjective:   Interval events noted.  Breathing is better today.  No other complaints.  [name], daughter, was at the bedside.  Objective:    Vitals:   01/02/23 0332 01/02/23 0500 01/02/23 0758 01/02/23 1214  BP: 139/83  (!) 140/94 (!) 128/94  Pulse: 61  (!) 39 (!) 50  Resp: 16  16 16   Temp: 97.7 F (36.5 C)  97.7 F (36.5 C)   TempSrc:   Oral   SpO2: 100%  100% 100%  Weight:  55.4 kg    Height:       No data found.   Intake/Output Summary (Last 24 hours) at 01/02/2023 1329 Last data filed at 01/02/2023 1259 Gross per 24 hour  Intake 0 ml  Output 800 ml  Net -800 ml   Filed Weights   12/31/22 0501 01/01/23 0424 01/02/23 0500  Weight: 55 kg 56 kg 55.4 kg    Exam:  GEN: NAD SKIN: Warm and dry EYES: No pallor or icterus ENT: MMM CV: RRR PULM: CTA B ABD: soft, ND, NT, +BS CNS: AAO x 3, non focal EXT: Mild bilateral leg edema.  No erythema or tenderness     Data Reviewed:   I have personally reviewed following labs and imaging studies:  Labs: Labs show the following:   Basic Metabolic Panel: Recent Labs  Lab 12/30/22 0857 12/30/22 1127 12/31/22 0428 01/01/23 0538 01/02/23 0630  NA 143  --  144 140 140  K 3.2*  --  4.2 3.7 3.1*  CL 112*  --  114* 111 110  CO2 19*  --  19* 19* 20*  GLUCOSE 115*  --  101* 116* 133*  BUN 44*  --  41* 44* 45*  CREATININE 2.02*  --  2.10* 2.08* 2.10*  CALCIUM 8.6*  --  8.5* 8.0* 8.0*  MG  --  2.0  --  2.3  --   PHOS  --  3.3  --   --   --    GFR Estimated Creatinine Clearance: 18.6 mL/min (A) (by C-G formula based on SCr of 2.1 mg/dL (H)). Liver Function Tests: Recent Labs  Lab 12/30/22 0857  AST 20  ALT 15  ALKPHOS 137*  BILITOT 0.8  PROT 6.3*  ALBUMIN 3.2*   Recent Labs  Lab 12/30/22 0857  LIPASE 20   No results for input(s): "AMMONIA" in the last 168 hours. Coagulation profile Recent Labs  Lab 12/30/22 0858  INR 1.3*    CBC: Recent Labs  Lab 12/30/22 0857  12/31/22 0428 01/01/23 0538  WBC 8.6 7.9 8.0  NEUTROABS 6.6  --   --   HGB 12.3 12.8 12.1  HCT 38.5 39.9  38.7  MCV 92.5 93.7 96.5  PLT 124* 109* 112*   Cardiac Enzymes: No results for input(s): "CKTOTAL", "CKMB", "CKMBINDEX", "TROPONINI" in the last 168 hours. BNP (last 3 results) No results for input(s): "PROBNP" in the last 8760 hours. CBG: No results for input(s): "GLUCAP" in the last 168 hours. D-Dimer: No results for input(s): "DDIMER" in the last 72 hours. Hgb A1c: No results for input(s): "HGBA1C" in the last 72 hours. Lipid Profile: No results for input(s): "CHOL", "HDL", "LDLCALC", "TRIG", "CHOLHDL", "LDLDIRECT" in the last 72 hours. Thyroid function studies: No results for input(s): "TSH", "T4TOTAL", "T3FREE", "THYROIDAB" in the last 72 hours.  Invalid input(s): "FREET3" Anemia work up: No results for input(s): "VITAMINB12", "FOLATE", "FERRITIN", "TIBC", "IRON", "RETICCTPCT" in the last 72 hours. Sepsis Labs: Recent Labs  Lab 12/30/22 0857 12/30/22 1127 12/31/22 0428 01/01/23 0538  WBC 8.6  --  7.9 8.0  LATICACIDVEN 2.0* 1.8  --   --     Microbiology Recent Results (from the past 240 hour(s))  SARS Coronavirus 2 by RT PCR (hospital order, performed in [facility] hospital lab) *cepheid single result test* Anterior Nasal Swab     Status: None   Collection Time: 12/30/22  9:23 AM   Specimen: Anterior Nasal Swab  Result Value Ref Range Status   SARS Coronavirus 2 by RT PCR NEGATIVE NEGATIVE Final    Comment: (NOTE) SARS-CoV-2 target nucleic acids are NOT DETECTED.  The SARS-CoV-2 RNA is generally detectable in upper and lower respiratory specimens during the acute phase of infection. The lowest concentration of SARS-CoV-2 viral copies this assay can detect is 250 copies / mL. A negative result does not preclude SARS-CoV-2 infection and should not be used as the sole basis for treatment or other patient management decisions.  A negative result may occur  with improper specimen collection / handling, submission of specimen other than nasopharyngeal swab, presence of viral mutation(s) within the areas targeted by this assay, and inadequate number of viral copies (<250 copies / mL). A negative result must be combined with clinical observations, patient history, and epidemiological information.  Fact Sheet for Patients:   RoadLapTop.co.za  Fact Sheet for Healthcare Providers: http://kim-miller.com/  This test is not yet approved or  cleared by the [place] FDA and has been authorized for detection and/or diagnosis of SARS-CoV-2 by FDA under an Emergency Use Authorization (EUA).  This EUA will remain in effect (meaning this test can be used) for the duration of the COVID-19 declaration under Section 564(b)(1) of the Act, 21 U.S.C. section 360bbb-3(b)(1), unless the authorization is terminated or revoked sooner.  Performed at [facility], [address] [place], [place] [address]   Blood culture (routine x 2)     Status: None (Preliminary result)   Collection Time: 12/30/22 11:27 AM   Specimen: BLOOD  Result Value Ref Range Status   Specimen Description BLOOD LEFT ANTECUBITAL  Final   Special Requests   Final    BOTTLES DRAWN AEROBIC AND ANAEROBIC Blood Culture adequate volume   Culture   Final    NO GROWTH 3 DAYS Performed at [facility], [address] [place], [place] [address]    Report Status PENDING  Incomplete  Blood culture (routine x 2)     Status: None (Preliminary result)   Collection Time: 12/30/22 11:27 AM   Specimen: BLOOD  Result Value Ref Range Status   Specimen Description BLOOD RIGHT ANTECUBITAL  Final   Special Requests   Final  BOTTLES DRAWN AEROBIC AND ANAEROBIC Blood Culture adequate volume   Culture   Final    NO GROWTH 3 DAYS Performed at [facility], [address] [place], [place] [address]    Report Status PENDING   Incomplete    Procedures and diagnostic studies:  No results found.             LOS: 3 days   Aristotle Lieb  [facility]   Pager on [email or web address]. If 7PM-7AM, please contact night-coverage at [email or web address]     01/02/2023, 1:29 PM

## 2023-01-02 NOTE — Progress Notes (Signed)
PT Cancellation Note  Patient Details Name: Ashley Lynch MRN: [id number] DOB: August 03, 1945   Cancelled Treatment:     PT attempt. Pt long sitting in bed upon arrival. Mostly untouched breakfast tray at bedside. Pt refuses PT at this time and drifted off to sleep while Thereasa Parkin was in room. Supportive daughter and granddaughter at bedside. Acute PT will continue efforts to progress as able per current POC.    Rushie Chestnut 01/02/2023, 10:24 AM

## 2023-01-02 NOTE — Progress Notes (Signed)
Palliative:  HPI: 77 y.o. female  with past medical history of multivessel CAD s/p PCI and angioplasty, ischemic cardiomyopathy, chronic HFrEF with EF 25-30%, CKD stage 3b, PVD, HLD admitted from home on 12/30/2022 with shortness of breath and worsening LE edema. Admitted and being treated for new onset A. Fib and CXR revealing pulmonary congestion. Noted recent admit last month for acute on chronic CHF with concern for medication non-adherence, appears Ms. Men left AMA. Palliative medicine was consulted for assisting with goals of care conversations.  I met today at Ms. Schechter bedside along with daughter, [name], and granddaughter, Fredric Mare. Fredric Mare lives with Ms. Coolidge and assists her. [name] lives close by and is very supportive. Ms. Poellnitz sleeps throughout much of our conversation. Family at bedside express to me that they are aware of Ms. Bobian wishes and just try and support and advocate for what she wants. [name] shares that her mother does not always take her medications and often does not wish to go to the hospital or provider appointment and she respects her mother's wishes. She knows that her mother really just wants to stay at home with her dog. Overall goals are aligned with hospice support in the home and family is open but report that Ms. Tow has preconceptions about hospice and hesitant for their support. She also will not take even Tylenol even if having pain. Family will continue conversations and will continue to support Ms. Ghattas wishes. [name] is accepting if her mother wishes to remain at home and die in her home without pursuing more interventions or rehospitalization. Family educated that hospice can be added to help with her care at any point.   We also discussed concern for information provided to family member who is not listed on chart to receive information - this was discussed and passed on to unit director to address. Ms. Ewald does awaken and clearly express that she desires [name]  to make decision for her if she is unable to do so herself. Advance Directive provided to clarify so there is no confusion in the future.   All questions/concerns addressed. Emotional support provided.   Exam: Sleepy - struggles to stay awake but oriented when about to stay awake to discuss. Awakens with episodes of transient shortness of breath/anxiety periodically - added 2L oxygen. Breathing regular, unlabored otherwise. Abd soft.   Plan: - DNR in place - Goals and wishes align with hospice philosophy but patient so far not open to hospice services - Family supportive of patient decisions and level of care she desires  60 min  Yong Channel, NP Palliative Medicine Team Pager [phone] (Please see [email or web address] for schedule) Team Phone [phone]    Greater than 50%  of this time was spent counseling and coordinating care related to the above assessment and plan

## 2023-01-02 NOTE — Progress Notes (Signed)
[facility] Cardiology  CARDIOLOGY PROGRESS NOTE  Patient ID: Ashley Lynch MRN: [id number] DOB/AGE: 77-Apr-1947 77 y.o.  Admit date: 12/30/2022 Referring Physician [facility] Primary Physician  Primary Cardiologist  Reason for Consultation congestive heart failure  HPI: 77 year old female referred for evaluation of congestive heart failure.  Patient has history of coronary artery disease, ischemic cardiomyopathy, and chronic HFrEF.  She also has history of chronic kidney disease stage IV, and diffuse large cell lymphoma in remission.  She was recently hospitalized for acute HFrEF 11/30/2022 and left AMA on 12/03/2022.  Reviewing outside records, the patient has not had consistent cardiology follow-up in the past 1 to 2 years.  Patient's daughter and granddaughter in the room.  Apparently the patient is unable to get up off the floor, called EMS, and was brought to [facility] ED primarily for tachycardia, where ECG revealed atrial fibrillation with rapid ventricular rate.  The patient was treated with intravenous metoprolol tartrate and digoxin, and the patient converted to sinus rhythm, and remains in sinus rhythm at 77 bpm.  Upon questioning, patient denies chest pain.  Troponin mildly elevated 203, 222 without ischemic ECG changes.  The patient has a history of NSTEMI 01/13/2020, was transferred to [facility] where she underwent PCI with stents to LAD and left circumflex, with residual chronically occluded RCA.  Most recent 2D echocardiogram 12/01/2022 revealed LVEF 25%.  AMP greater than 4500.  Interval history: -Patient feeling ok this AM, complaining of cramping in her hand.  -No documented UOP yesterday but weight down and daughter reports she did have good ouput yesterday.  -Continues to report SOB, not requiring supplemental O2.   Review of systems complete and found to be negative unless listed above     Past Medical History:  Diagnosis Date   Cancer (HCC)    lymphoma   Cardiomyopathy (HCC)    Complication of  anesthesia    slow to wake   H/O heart artery stent 12/2019   X2   HFrEF (heart failure with reduced ejection fraction) (HCC)    History of chicken pox    History of measles, mumps, or rubella    HTN (hypertension)    Iliac artery occlusion (HCC)    Bilateral   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)    Myocardial infarction (HCC) 04/23/2021   NSTEMI   PAD (peripheral artery disease) [facility])     Past Surgical History:  Procedure Laterality Date   ABDOMINAL HYSTERECTOMY  04/18/2011   CARDIAC CATHETERIZATION  04/25/2021   CATARACT EXTRACTION W/PHACO Left 09/20/2021   Procedure: CATARACT EXTRACTION PHACO AND INTRAOCULAR LENS PLACEMENT (IOC) LEFT  18.07 01:25.3;  Surgeon: Galen Manila, MD;  Location: [facility];  Service: Ophthalmology;  Laterality: Left;   CATARACT EXTRACTION W/PHACO Right 10/04/2021   Procedure: CATARACT EXTRACTION PHACO AND INTRAOCULAR LENS PLACEMENT (IOC) RIGHT  5.68 00:44.8;  Surgeon: Galen Manila, MD;  Location: [facility];  Service: Ophthalmology;  Laterality: Right;   GALLBLADDER SURGERY  1975 1981   LEFT HEART CATH AND CORONARY ANGIOGRAPHY N/A 01/13/2020   Procedure: LEFT HEART CATH AND CORONARY ANGIOGRAPHY;  Surgeon: Laurier Nancy, MD;  Location: [facility] INVASIVE CV LAB;  Service: Cardiovascular;  Laterality: N/A;   TONSILLECTOMY  04/17/2008    Medications Prior to Admission  Medication Sig Dispense Refill Last Dose   aspirin 81 MG tablet Take 81 mg by mouth daily. (Patient not taking: Reported on 09/19/2021)      atorvastatin (LIPITOR) 80 MG tablet Take 80 mg by mouth. (Patient not taking:  Reported on 09/19/2021)      clopidogrel (PLAVIX) 75 MG tablet Take 1 tablet (75 mg total) by mouth daily. (Patient not taking: Reported on 12/30/2022) 30 tablet 1 Not Taking   furosemide (LASIX) 20 MG tablet TAKE 1 TABLET BY MOUTH EVERY DAY (Patient not taking: Reported on 12/30/2022) 30 tablet 1 Not Taking   GEMTESA 75 MG TABS Take 75 mg by mouth daily. (Patient not  taking: Reported on 12/30/2022)   Not Taking   metoprolol succinate (TOPROL-XL) 25 MG 24 hr tablet Take 1 tablet (25 mg total) by mouth daily. (Patient not taking: Reported on 12/30/2022) 30 tablet 1 Not Taking   Multiple Vitamin (MULTIVITAMIN WITH MINERALS) TABS tablet Take 1 tablet by mouth daily. (Patient not taking: Reported on 12/30/2022) 30 tablet 1 Not Taking   NICODERM CQ 14 MG/24HR patch Place 1 patch onto the skin daily.      nitroGLYCERIN (NITROSTAT) 0.4 MG SL tablet Place 0.4 tablets under the tongue daily as needed. (Patient not taking: Reported on 12/30/2022)   Not Taking   omeprazole (PRILOSEC) 20 MG capsule Take 1 capsule (20 mg total) by mouth daily. (Patient not taking: Reported on 12/30/2022) 90 capsule 3 Not Taking   sacubitril-valsartan (ENTRESTO) 24-26 MG Take 1 tablet by mouth 2 (two) times daily. (Patient not taking: Reported on 12/30/2022) 60 tablet 1 Not Taking   sertraline (ZOLOFT) 50 MG tablet TAKE 1 TABLET BY MOUTH EVERY DAY (Patient not taking: Reported on 12/30/2022) 90 tablet 2 Not Taking   traMADol (ULTRAM) 50 MG tablet Take 1 tablet (50 mg total) by mouth 3 (three) times daily as needed. (Patient not taking: Reported on 12/30/2022) 30 tablet 0 Not Taking   Social History   Socioeconomic History   Marital status: Widowed    Spouse name: Not on file   Number of children: Not on file   Years of education: Not on file   Highest education level: Not on file  Occupational History   Not on file  Tobacco Use   Smoking status: Every Day    Current packs/day: 1.00    Average packs/day: 1 pack/day for 60.0 years (60.0 ttl pk-yrs)    Types: Cigarettes   Smokeless tobacco: Never  Vaping Use   Vaping status: Never Used  Substance and Sexual Activity   Alcohol use: No   Drug use: No   Sexual activity: Not on file  Other Topics Concern   Not on file  Social History Narrative   Not on file   Social Determinants of Health   Financial Resource Strain: Low Risk   (04/24/2021)   Received from [facility]   Overall Financial Resource Strain (CARDIA)    Difficulty of Paying Living Expenses: Not hard at all  Food Insecurity: No Food Insecurity (12/01/2022)   Hunger Vital Sign    Worried About Running Out of Food in the Last Year: Never true    Ran Out of Food in the Last Year: Never true  Transportation Needs: No Transportation Needs (12/01/2022)   PRAPARE - Administrator, Civil Service (Medical): No    Lack of Transportation (Non-Medical): No  Physical Activity: Not on file  Stress: Not on file  Social Connections: Not on file  Intimate Partner Violence: Not At Risk (12/01/2022)   Humiliation, Afraid, Rape, and Kick questionnaire    Fear of Current or Ex-Partner: No    Emotionally Abused: No    Physically Abused: No  Sexually Abused: No    Family History  Problem Relation Age of Onset   Diabetes Mother        sibling and grandparent also      Review of systems complete and found to be negative unless listed above    PHYSICAL EXAM General: Chronically-ill appearing, in no acute distress resting in hospital bed with daughter [name] present at bedside. HEENT:  Normocephalic and atramatic Neck:  No JVD.  Lungs: Clear bilaterally to auscultation and percussion. Heart: HRRR . Normal S1 and S2 without gallops or murmurs.  Abdomen: Bowel sounds are positive, abdomen soft and non-tender  Msk:  Back normal, normal gait. Normal strength and tone for age. Extremities: No clubbing, cyanosis or edema.   Neuro: Alert and oriented X 3. Psych:  Good affect, responds appropriately  Labs:   Lab Results  Component Value Date   WBC 8.0 01/01/2023   HGB 12.1 01/01/2023   HCT 38.7 01/01/2023   MCV 96.5 01/01/2023   PLT 112 (L) 01/01/2023    Recent Labs  Lab 12/30/22 0857 12/31/22 0428 01/02/23 0630  NA 143   < > 140  K 3.2*   < > 3.1*  CL 112*   < > 110  CO2 19*   < > 20*  BUN 44*   < > 45*  CREATININE 2.02*    < > 2.10*  CALCIUM 8.6*   < > 8.0*  PROT 6.3*  --   --   BILITOT 0.8  --   --   ALKPHOS 137*  --   --   ALT 15  --   --   AST 20  --   --   GLUCOSE 115*   < > 133*   < > = values in this interval not displayed.   Lab Results  Component Value Date   TROPONINI <0.03 06/28/2017    Lab Results  Component Value Date   CHOL 130 01/13/2020   CHOL 180 06/29/2017   CHOL 190 06/19/2016   Lab Results  Component Value Date   HDL 43 01/13/2020   HDL 36 (L) 06/29/2017   HDL 35 (L) 06/19/2016   Lab Results  Component Value Date   LDLCALC 70 01/13/2020   LDLCALC 109 (H) 06/29/2017   LDLCALC 127 (H) 06/19/2016   Lab Results  Component Value Date   TRIG 83 01/13/2020   TRIG 176 (H) 06/29/2017   TRIG 141 06/19/2016   Lab Results  Component Value Date   CHOLHDL 3.0 01/13/2020   CHOLHDL 5.0 06/29/2017   CHOLHDL 5.4 (H) 06/19/2016   No results found for: "LDLDIRECT"    Radiology: DG Chest 1 View  Result Date: 12/31/2022 CLINICAL DATA:  Congestive heart failure. EXAM: CHEST  1 VIEW COMPARISON:  12/30/2022 FINDINGS: The cardio pericardial silhouette is enlarged. Bibasilar collapse/consolidation noted, right greater than left with moderate right pleural effusion. Diffuse interstitial opacity likely chronic although superimposed component of interstitial edema not excluded. Bones are diffusely demineralized. Telemetry leads overlie the chest. IMPRESSION: 1. No substantial change. 2. Bibasilar collapse/consolidation, right greater than left with moderate right pleural effusion. 3. Diffuse interstitial opacity likely chronic although superimposed component of interstitial edema not excluded. Electronically Signed   By: Kennith Center M.D.   On: 12/31/2022 07:30   DG Chest 2 View  Result Date: 12/30/2022 CLINICAL DATA:  Cough, dyspnea. EXAM: CHEST - 2 VIEW COMPARISON:  Chest radiograph dated 12/01/2022. FINDINGS: The heart size is borderline enlarged. Vascular calcifications are seen  in the  aortic arch. There is a moderate right pleural effusion with associated atelectasis/airspace disease. There is no left pleural effusion. There is mild left basilar atelectasis/airspace disease. Mild diffuse bilateral interstitial opacities are seen. No pneumothorax. IMPRESSION: Moderate right pleural effusion with associated atelectasis/airspace disease. Mild left basilar atelectasis/airspace disease. Mild diffuse bilateral interstitial opacities may represent pulmonary edema. Electronically Signed   By: Romona Curls M.D.   On: 12/30/2022 09:26   US RENAL  Result Date: 12/03/2022 CLINICAL DATA:  Acute kidney injury EXAM: RENAL / URINARY TRACT ULTRASOUND COMPLETE COMPARISON:  CT abdomen and pelvis dated March 04, 2017 FINDINGS: Right Kidney: Renal measurements: 9.4 x 3.4 x 4.0 cm = volume: 66.2 mL. Echogenicity within normal limits. Duplicated system. Calcified cyst of the lower pole of the right kidney measuring 2.1 x 2.1 x 1.6 cm. No mass or hydronephrosis visualized. Left Kidney: Renal measurements: 9.8 x 4.7 x 3.6 cm = volume: 81.4 mL. Echogenicity within normal limits. No mass or hydronephrosis visualized. Multiple renal calculi, largest is located in the mid region and measures 7.2 mm. Bladder: Not well visualized, decompressed and contains a Foley catheter. Other: Bilateral pleural effusions. IMPRESSION: 1. No hydronephrosis. 2. Left renal calculi. 3. Cyst of the lower pole of the right kidney with calcifications, also previously seen on prior 2018 CT. 4. Bilateral pleural effusions. Electronically Signed   By: Allegra Lai M.D.   On: 12/03/2022 16:20    EKG: Atrial fibrillation at a rate of 116 bpm  Telemetry reviewed (01/02/23): sinus rhythm rate 60s, short paroxysms up to 140s and down to 40s. 5 beat NSVT at 2027 on 9/16.  Data reviewed (01/02/23): last 24h vitals tele labs imaging I/O hospitalist progress note, palliative care note  ASSESSMENT AND PLAN:   1.  Acute on chronic HFrEF,  with LVEF less than 25%, with probable medical noncompliance, patient admits to taking Entresto once a day, does not have consistent cardiology follow up. No documented UOP yesterday. 2.  Coronary artery disease, status post NSTEMI 01/13/2020, status post stents LAD and left circumflex with residual occluded RCA, currently without chest pain 3.  Mildly elevated high-sensitivity troponin (203, 222) in the absence of chest pain, likely demand supply ischemia, secondary to acute on chronic HFrEF, with underlying CKD, stage IV 4.  Paroxysmal atrial fibrillation, not chronically anticoagulated 5.  Diffuse large cell lymphoma in remission 6. Medical noncompliance, per patient's daughter she often does not take her medications and does not like to go to doctors appointments.  Recommendations  1.  Will continue furosemide 20 mg IV daily. Strict I&O.  2.  Continue metoprolol succinate 12.5 mg twice daily. Periods of tachycardia/bradycardia. Daughter states patient would not want any intervention.  3.  Hold Entresto and ACE/ARB for now 4.  Continue low-dose Eliquis 2.5 mg twice daily (dose appropriate given weight <60 kg and Cr >1.5) 5.  Palliative medicine is following - further conversations will be beneficial given patient's chronic conditions and her preference to not take medications/follow in clinic regularly.  This patient's plan of care was discussed and created with Dr. Melton Alar and she is in agreement.    SignedGale Journey, PA-C  01/02/2023, 10:15 AM

## 2023-01-03 DIAGNOSIS — I5023 Acute on chronic systolic (congestive) heart failure: Secondary | ICD-10-CM | POA: Diagnosis not present

## 2023-01-03 MED ORDER — POLYETHYLENE GLYCOL 3350 17 G PO PACK: 17.0000 g | PACK | Freq: Every day | ORAL | Status: DC | PRN

## 2023-01-03 MED ORDER — FUROSEMIDE 20 MG PO TABS
20.0000 mg | ORAL_TABLET | Freq: Every day | ORAL | Status: DC
  Administered 2023-01-04: 20 mg via ORAL
  Filled 2023-01-03: qty 1

## 2023-01-03 MED ORDER — METOPROLOL SUCCINATE ER 25 MG PO TB24
12.5000 mg | ORAL_TABLET | Freq: Every day | ORAL | Status: DC
  Administered 2023-01-04: 12.5 mg via ORAL
  Filled 2023-01-03: qty 1

## 2023-01-03 MED ORDER — HYDROCORTISONE 0.5 % EX CREA
TOPICAL_CREAM | CUTANEOUS | Status: DC | PRN
  Filled 2023-01-03: qty 28.35

## 2023-01-03 MED ORDER — SENNOSIDES-DOCUSATE SODIUM 8.6-50 MG PO TABS
1.0000 | ORAL_TABLET | Freq: Two times a day (BID) | ORAL | Status: DC
  Administered 2023-01-03 – 2023-01-04 (×3): 1 via ORAL
  Filled 2023-01-03 (×3): qty 1

## 2023-01-03 NOTE — Progress Notes (Signed)
Physical Therapy Treatment Patient Details Name: Ashley Lynch MRN: [id number] DOB: 28-Jan-1946 Today's Date: 01/03/2023   History of Present Illness Patient is a 77 year old with CHF decompensation, new onset A-fib. History of CHF, CKD, CAD, PVD, diffuse large cell lymphoma in remission, hypertension.    PT Comments  Patient reclined in bed on arrival and agreeable to attempt PT session this date. Required minA to sit EOB but refuses to attempt standing this date despite encouragement. Able to tolerate sitting EOB ~7-8 minutes with supervision with no LOB. Easily fatigued and requesting to return to supine. Discharge plan remains appropriate at this time.    If plan is discharge home, recommend the following: A little help with walking and/or transfers;A little help with bathing/dressing/bathroom;Assistance with cooking/housework;Supervision due to cognitive status;Help with stairs or ramp for entrance   Can travel by private vehicle        Equipment Recommendations  BSC/3in1;Rolling Necole Minassian (2 wheels)    Recommendations for Other Services       Precautions / Restrictions Precautions Precautions: Fall Restrictions Weight Bearing Restrictions: No     Mobility  Bed Mobility Overal bed mobility: Needs Assistance Bed Mobility: Supine to Sit, Sit to Supine     Supine to sit: Min assist Sit to supine: Min assist   General bed mobility comments: assistance for trunk support to sit upright. assistance for LE support to return to bed    Transfers                   General transfer comment: refused standing or further mobility    Ambulation/Gait                   Stairs             Wheelchair Mobility     Tilt Bed    Modified Rankin (Stroke Patients Only)       Balance Overall balance assessment: Needs assistance Sitting-balance support: Feet supported Sitting balance-Leahy Scale: Fair                                       Cognition Arousal: Alert Behavior During Therapy: WFL for tasks assessed/performed, Flat affect Overall Cognitive Status: History of cognitive impairments - at baseline Area of Impairment: Attention, Memory, Following commands, Safety/judgement, Awareness, Problem solving                       Following Commands: Follows one step commands with increased time Safety/Judgement: Decreased awareness of deficits   Problem Solving: Slow processing, Requires verbal cues, Requires tactile cues General Comments: follows commands with increased time, needs encourgement to respond to questions/participate        Exercises      General Comments        Pertinent Vitals/Pain Pain Assessment Pain Assessment: No/denies pain    Home Living                          Prior Function            PT Goals (current goals can now be found in the care plan section) Acute Rehab PT Goals PT Goal Formulation: With patient Time For Goal Achievement: 01/14/23 Potential to Achieve Goals: Fair Progress towards PT goals: Progressing toward goals    Frequency    Min 1X/week  PT Plan      Co-evaluation              AM-PAC PT "6 Clicks" Mobility   Outcome Measure  Help needed turning from your back to your side while in a flat bed without using bedrails?: A Little Help needed moving from lying on your back to sitting on the side of a flat bed without using bedrails?: A Little Help needed moving to and from a bed to a chair (including a wheelchair)?: A Little Help needed standing up from a chair using your arms (e.g., wheelchair or bedside chair)?: A Little Help needed to walk in hospital room?: A Little Help needed climbing 3-5 steps with a railing? : A Lot 6 Click Score: 17    End of Session   Activity Tolerance: Patient limited by fatigue Patient left: in bed;with call bell/phone within reach;with bed alarm set;with family/visitor present Nurse  Communication: Mobility status PT Visit Diagnosis: Unsteadiness on feet (R26.81);Muscle weakness (generalized) (M62.81)     Time: 9147-8295 PT Time Calculation (min) (ACUTE ONLY): 8 min  Charges:    $Therapeutic Activity: 8-22 mins PT General Charges $$ ACUTE PT VISIT: 1 Visit                     Maylon Peppers, PT, DPT Physical Therapist - [facility]  [facility]    Jacion Dismore A Milinda Sweeney 01/03/2023, 3:28 PM

## 2023-01-03 NOTE — Progress Notes (Signed)
Occupational Therapy Treatment Patient Details Name: Ashley Lynch MRN: [id number] DOB: 04-07-1946 Today's Date: 01/03/2023   History of present illness Patient is a 77 year old with CHF decompensation, new onset A-fib. History of CHF, CKD, CAD, PVD, diffuse large cell lymphoma in remission, hypertension.   OT comments  Pt seen for OT tx this date, received in bed with granddaughter present. Agreeable to tx with encouragement. Pt tolerates sitting EOB to brush teeth. Overall minA for bed mobility and setup for ADL task. Pt limited in occupational performance by decreased tolerance to activity, decreased motivation, generalized weakness and slowed processing. Pt making fair progress toward goals, will continue to follow POC. Discharge recommendation remains appropriate.        If plan is discharge home, recommend the following:  A little help with walking and/or transfers;A lot of help with bathing/dressing/bathroom;Assistance with cooking/housework;Help with stairs or ramp for entrance   Equipment Recommendations  BSC/3in1    Recommendations for Other Services Other (comment)    Precautions / Restrictions Precautions Precautions: Fall Restrictions Weight Bearing Restrictions: No       Mobility Bed Mobility Overal bed mobility: Needs Assistance Bed Mobility: Supine to Sit, Sit to Supine     Supine to sit: Min assist Sit to supine: Min assist   General bed mobility comments: assistance for trunk support to sit upright. assistance for LE support to return to bed    Transfers                   General transfer comment: refused standing or further mobility     Balance Overall balance assessment: Needs assistance Sitting-balance support: Feet supported Sitting balance-Leahy Scale: Fair Sitting balance - Comments: sits to brush teeth no LOB                                   ADL either performed or assessed with clinical judgement   ADL Overall ADL's  : Needs assistance/impaired     Grooming: Oral care;Set up;Sitting Grooming Details (indicate cue type and reason): setup of toothbrush toothpase, pt initally wanting OT to brush teeth for her, cues to perform for her self                               General ADL Comments: Pt refuses further ADLs or mobility, despite encourgement and education on importance of participation for functional gains. Pt tolerates stitting EOB for session but adamently refuses to attempt standing      Cognition Arousal: Alert Behavior During Therapy: WFL for tasks assessed/performed, Flat affect Overall Cognitive Status: History of cognitive impairments - at baseline Area of Impairment: Attention, Memory, Following commands, Safety/judgement, Awareness, Problem solving                       Following Commands: Follows one step commands with increased time Safety/Judgement: Decreased awareness of deficits   Problem Solving: Slow processing, Requires verbal cues, Requires tactile cues General Comments: follows commands with increased time, needs encourgement to respond to questions/participate                   Pertinent Vitals/ Pain       Pain Assessment Pain Assessment: No/denies pain   Frequency  Min 1X/week        Progress Toward Goals  OT Goals(current goals can now  be found in the care plan section)  Progress towards OT goals: Progressing toward goals  Acute Rehab OT Goals OT Goal Formulation: With patient Time For Goal Achievement: 01/14/23 Potential to Achieve Goals: Good  Plan         AM-PAC OT "6 Clicks" Daily Activity     Outcome Measure   Help from another person eating meals?: None Help from another person taking care of personal grooming?: A Little Help from another person toileting, which includes using toliet, bedpan, or urinal?: A Little Help from another person bathing (including washing, rinsing, drying)?: A Lot Help from another person to  put on and taking off regular upper body clothing?: A Little Help from another person to put on and taking off regular lower body clothing?: A Lot 6 Click Score: 17    End of Session    OT Visit Diagnosis: Other abnormalities of gait and mobility (R26.89);Muscle weakness (generalized) (M62.81)   Activity Tolerance Patient limited by fatigue   Patient Left in bed;with call bell/phone within reach;with bed alarm set;with family/visitor present   Nurse Communication Other (comment)        Time: 2725-3664 OT Time Calculation (min): 10 min  Charges: OT General Charges $OT Visit: 1 Visit OT Treatments $Self Care/Home Management : 8-22 mins  Cassidie Veiga L. Arrin Ishler, OTR/L  01/03/23, 12:45 PM

## 2023-01-03 NOTE — Plan of Care (Signed)

## 2023-01-03 NOTE — Progress Notes (Signed)
[facility] Cardiology  CARDIOLOGY PROGRESS NOTE  Patient ID: Ashley Lynch MRN: [id number] DOB/AGE: [birth year] 77 y.o.  Admit date: 12/30/2022 Referring Physician [facility] Primary Physician  Primary Cardiologist  Reason for Consultation congestive heart failure  HPI: 77 year old female referred for evaluation of congestive heart failure.  Patient has history of coronary artery disease, ischemic cardiomyopathy, and chronic HFrEF.  She also has history of chronic kidney disease stage IV, and diffuse large cell lymphoma in remission.  She was recently hospitalized for acute HFrEF 11/30/2022 and left AMA on 12/03/2022.  Reviewing outside records, the patient has not had consistent cardiology follow-up in the past 1 to 2 years.  Patient's daughter and granddaughter in the room.  Apparently the patient is unable to get up off the floor, called EMS, and was brought to [facility] ED primarily for tachycardia, where ECG revealed atrial fibrillation with rapid ventricular rate.  The patient was treated with intravenous metoprolol tartrate and digoxin, and the patient converted to sinus rhythm, and remains in sinus rhythm at 77 bpm.  Upon questioning, patient denies chest pain.  Troponin mildly elevated 203, 222 without ischemic ECG changes.  The patient has a history of NSTEMI 01/13/2020, was transferred to [facility] where she underwent PCI with stents to LAD and left circumflex, with residual chronically occluded RCA.  Most recent 2D echocardiogram 12/01/2022 revealed LVEF 25%.  AMP greater than 4500.  Interval history: -Patient reports she is "feeling better" today. Grand daughter reports she is more settled today than she has been, appears more comfortable.  -Has diuresed well with IV lasix, now net negative 1.6 L. Renal function remains stable.  -States that her SOB is much improved, denies any chest pain or palpitations.   Review of systems complete and found to be negative unless listed above     Past Medical History:   Diagnosis Date   Cancer (HCC)    lymphoma   Cardiomyopathy (HCC)    Complication of anesthesia    slow to wake   H/O heart artery stent 12/2019   X2   HFrEF (heart failure with reduced ejection fraction) (HCC)    History of chicken pox    History of measles, mumps, or rubella    HTN (hypertension)    Iliac artery occlusion (HCC)    Bilateral   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)    Myocardial infarction (HCC) 04/23/2021   NSTEMI   PAD (peripheral artery disease) [facility])     Past Surgical History:  Procedure Laterality Date   ABDOMINAL HYSTERECTOMY  04/18/2011   CARDIAC CATHETERIZATION  04/25/2021   CATARACT EXTRACTION W/PHACO Left 09/20/2021   Procedure: CATARACT EXTRACTION PHACO AND INTRAOCULAR LENS PLACEMENT (IOC) LEFT  18.07 01:25.3;  Surgeon: Galen Manila, MD;  Location: [facility];  Service: Ophthalmology;  Laterality: Left;   CATARACT EXTRACTION W/PHACO Right 10/04/2021   Procedure: CATARACT EXTRACTION PHACO AND INTRAOCULAR LENS PLACEMENT (IOC) RIGHT  5.68 00:44.8;  Surgeon: Galen Manila, MD;  Location: [facility];  Service: Ophthalmology;  Laterality: Right;   GALLBLADDER SURGERY  1975 1981   LEFT HEART CATH AND CORONARY ANGIOGRAPHY N/A 01/13/2020   Procedure: LEFT HEART CATH AND CORONARY ANGIOGRAPHY;  Surgeon: Laurier Nancy, MD;  Location: [facility] INVASIVE CV LAB;  Service: Cardiovascular;  Laterality: N/A;   TONSILLECTOMY  04/17/2008    Medications Prior to Admission  Medication Sig Dispense Refill Last Dose   aspirin 81 MG tablet Take 81 mg by mouth daily. (Patient not taking: Reported on 09/19/2021)  atorvastatin (LIPITOR) 80 MG tablet Take 80 mg by mouth. (Patient not taking: Reported on 09/19/2021)      clopidogrel (PLAVIX) 75 MG tablet Take 1 tablet (75 mg total) by mouth daily. (Patient not taking: Reported on 12/30/2022) 30 tablet 1 Not Taking   furosemide (LASIX) 20 MG tablet TAKE 1 TABLET BY MOUTH EVERY DAY (Patient not taking: Reported on  12/30/2022) 30 tablet 1 Not Taking   GEMTESA 75 MG TABS Take 75 mg by mouth daily. (Patient not taking: Reported on 12/30/2022)   Not Taking   metoprolol succinate (TOPROL-XL) 25 MG 24 hr tablet Take 1 tablet (25 mg total) by mouth daily. (Patient not taking: Reported on 12/30/2022) 30 tablet 1 Not Taking   Multiple Vitamin (MULTIVITAMIN WITH MINERALS) TABS tablet Take 1 tablet by mouth daily. (Patient not taking: Reported on 12/30/2022) 30 tablet 1 Not Taking   NICODERM CQ 14 MG/24HR patch Place 1 patch onto the skin daily.      nitroGLYCERIN (NITROSTAT) 0.4 MG SL tablet Place 0.4 tablets under the tongue daily as needed. (Patient not taking: Reported on 12/30/2022)   Not Taking   omeprazole (PRILOSEC) 20 MG capsule Take 1 capsule (20 mg total) by mouth daily. (Patient not taking: Reported on 12/30/2022) 90 capsule 3 Not Taking   sacubitril-valsartan (ENTRESTO) 24-26 MG Take 1 tablet by mouth 2 (two) times daily. (Patient not taking: Reported on 12/30/2022) 60 tablet 1 Not Taking   sertraline (ZOLOFT) 50 MG tablet TAKE 1 TABLET BY MOUTH EVERY DAY (Patient not taking: Reported on 12/30/2022) 90 tablet 2 Not Taking   traMADol (ULTRAM) 50 MG tablet Take 1 tablet (50 mg total) by mouth 3 (three) times daily as needed. (Patient not taking: Reported on 12/30/2022) 30 tablet 0 Not Taking   Social History   Socioeconomic History   Marital status: Widowed    Spouse name: Not on file   Number of children: Not on file   Years of education: Not on file   Highest education level: Not on file  Occupational History   Not on file  Tobacco Use   Smoking status: Every Day    Current packs/day: 1.00    Average packs/day: 1 pack/day for 60.0 years (60.0 ttl pk-yrs)    Types: Cigarettes   Smokeless tobacco: Never  Vaping Use   Vaping status: Never Used  Substance and Sexual Activity   Alcohol use: No   Drug use: No   Sexual activity: Not on file  Other Topics Concern   Not on file  Social History Narrative    Not on file   Social Determinants of Health   Financial Resource Strain: Low Risk  (04/24/2021)   Received from [facility], [facility]   Overall Financial Resource Strain (CARDIA)    Difficulty of Paying Living Expenses: Not hard at all  Food Insecurity: No Food Insecurity (12/01/2022)   Hunger Vital Sign    Worried About Running Out of Food in the Last Year: Never true    Ran Out of Food in the Last Year: Never true  Transportation Needs: No Transportation Needs (12/01/2022)   PRAPARE - Administrator, Civil Service (Medical): No    Lack of Transportation (Non-Medical): No  Physical Activity: Not on file  Stress: Not on file  Social Connections: Not on file  Intimate Partner Violence: Not At Risk (12/01/2022)   Humiliation, Afraid, Rape, and Kick questionnaire    Fear of Current or Ex-Partner: No  Emotionally Abused: No    Physically Abused: No    Sexually Abused: No    Family History  Problem Relation Age of Onset   Diabetes Mother        sibling and grandparent also      Review of systems complete and found to be negative unless listed above    PHYSICAL EXAM General: Chronically-ill appearing, in no acute distress resting in hospital bed with granddaughter present at bedside. HEENT:  Normocephalic and atramatic Neck:  No JVD.  Lungs: Clear bilaterally to auscultation and percussion. Heart: HRRR . Normal S1 and S2 without gallops or murmurs.  Abdomen: Bowel sounds are positive, abdomen soft and non-tender  Msk:  Back normal, normal gait. Normal strength and tone for age. Extremities: No clubbing, cyanosis or edema.   Neuro: Alert and oriented X 3. Psych:  Good affect, responds appropriately  Labs:   Lab Results  Component Value Date   WBC 8.0 01/01/2023   HGB 12.1 01/01/2023   HCT 38.7 01/01/2023   MCV 96.5 01/01/2023   PLT 112 (L) 01/01/2023    Recent Labs  Lab 12/30/22 0857 12/31/22 0428 01/03/23 0549  NA 143   < > 142  K 3.2*   <  > 4.1  CL 112*   < > 110  CO2 19*   < > 23  BUN 44*   < > 41*  CREATININE 2.02*   < > 1.89*  CALCIUM 8.6*   < > 8.2*  PROT 6.3*  --   --   BILITOT 0.8  --   --   ALKPHOS 137*  --   --   ALT 15  --   --   AST 20  --   --   GLUCOSE 115*   < > 88   < > = values in this interval not displayed.   Lab Results  Component Value Date   TROPONINI <0.03 06/28/2017    Lab Results  Component Value Date   CHOL 130 01/13/2020   CHOL 180 06/29/2017   CHOL 190 06/19/2016   Lab Results  Component Value Date   HDL 43 01/13/2020   HDL 36 (L) 06/29/2017   HDL 35 (L) 06/19/2016   Lab Results  Component Value Date   LDLCALC 70 01/13/2020   LDLCALC 109 (H) 06/29/2017   LDLCALC 127 (H) 06/19/2016   Lab Results  Component Value Date   TRIG 83 01/13/2020   TRIG 176 (H) 06/29/2017   TRIG 141 06/19/2016   Lab Results  Component Value Date   CHOLHDL 3.0 01/13/2020   CHOLHDL 5.0 06/29/2017   CHOLHDL 5.4 (H) 06/19/2016   No results found for: "LDLDIRECT"    Radiology: DG Chest 1 View  Result Date: 12/31/2022 CLINICAL DATA:  Congestive heart failure. EXAM: CHEST  1 VIEW COMPARISON:  12/30/2022 FINDINGS: The cardio pericardial silhouette is enlarged. Bibasilar collapse/consolidation noted, right greater than left with moderate right pleural effusion. Diffuse interstitial opacity likely chronic although superimposed component of interstitial edema not excluded. Bones are diffusely demineralized. Telemetry leads overlie the chest. IMPRESSION: 1. No substantial change. 2. Bibasilar collapse/consolidation, right greater than left with moderate right pleural effusion. 3. Diffuse interstitial opacity likely chronic although superimposed component of interstitial edema not excluded. Electronically Signed   By: Kennith Center M.D.   On: 12/31/2022 07:30   DG Chest 2 View  Result Date: 12/30/2022 CLINICAL DATA:  Cough, dyspnea. EXAM: CHEST - 2 VIEW COMPARISON:  Chest radiograph dated 12/01/2022.  FINDINGS: The heart size is borderline enlarged. Vascular calcifications are seen in the aortic arch. There is a moderate right pleural effusion with associated atelectasis/airspace disease. There is no left pleural effusion. There is mild left basilar atelectasis/airspace disease. Mild diffuse bilateral interstitial opacities are seen. No pneumothorax. IMPRESSION: Moderate right pleural effusion with associated atelectasis/airspace disease. Mild left basilar atelectasis/airspace disease. Mild diffuse bilateral interstitial opacities may represent pulmonary edema. Electronically Signed   By: Romona Curls M.D.   On: 12/30/2022 09:26    EKG: Atrial fibrillation at a rate of 116 bpm  Telemetry reviewed (01/03/23): sinus rhythm rate 50s, PACs. Brady overnight to the upper 30s  Data reviewed (01/03/23): last 24h vitals tele labs imaging I/O hospitalist progress note, palliative care note  ASSESSMENT AND PLAN:   1.  Acute on chronic HFrEF, with LVEF less than 25%, with probable medical noncompliance, patient admits to taking Entresto once a day, does not have consistent cardiology follow up. No documented UOP yesterday. 2.  Coronary artery disease, status post NSTEMI 01/13/2020, status post stents LAD and left circumflex with residual occluded RCA, currently without chest pain 3.  Mildly elevated high-sensitivity troponin (203, 222) in the absence of chest pain, likely demand supply ischemia, secondary to acute on chronic HFrEF, with underlying CKD, stage IV 4.  Paroxysmal atrial fibrillation, not chronically anticoagulated 5.  Diffuse large cell lymphoma in remission 6. Medical noncompliance, per patient's daughter she often does not take her medications and does not like to go to doctors appointments.  Recommendations  1.  Will transition to po lasix tomorrow. Continue to monitor urine output. 2.  Decrease metoprolol to 12.5 once daily given bradycardia to the upper 30s overnight. Continued episodes  of tachycardia/bradycardia. Daughter states patient would not want any intervention.  3.  Hold Entresto and ACE/ARB for now. Further additions of GDMT limited by renal function.  4.  Continue low-dose Eliquis 2.5 mg twice daily (dose appropriate given weight <60 kg and Cr >1.5) 5.  Palliative medicine is following - patient would qualify for hospice care given her preferences of limiting interventions however she is not interested at this time.   This patient's plan of care was discussed and created with Dr. Melton Alar and she is in agreement.    SignedGale Journey, PA-C  01/03/2023, 12:35 PM

## 2023-01-03 NOTE — Progress Notes (Signed)
PROGRESS NOTE    Ashley Lynch  JOA:[id number] DOB: 1946-03-18 DOA: 12/30/2022 PCP: Margaretann Loveless, MD    Brief Narrative:  77 y.o. female with medical history significant for CAD, PVD, chronic systolic CHF with EF of 25%, CKD stage IV, diffuse large cell lymphoma in remission, hypertension, recent hospitalization for CHF exacerbation from 11/30/2022 through 12/03/2022 (left AMA on that visit).  She presented to the hospital because of generalized weakness, fatigue, shortness of breath, lower extremity swelling, slow and fast heart rate and decreased oxygen saturation.  Her daughter said that her pulse at home was low in the 50s and then it later jumped into the 140s.  Apparently, oxygen saturation dropped into the 80s and she required 2 L of oxygen to bring Her oxygen saturation to 91%.  It is also reported that patient has not been medically adherent and has not been taking her diuretics.   Her heart rate was in the 140s in the emergency department.  She was found to have atrial fibrillation with rapid ventricular response and CHF exacerbation.  She was treated with IV metoprolol and digoxin.  Heart rate went down to into the 40s.  She eventually converted to normal sinus rhythm.   Assessment & Plan:   Principal Problem:   CHF (congestive heart failure) (HCC) Active Problems:   Acute systolic (congestive) heart failure (HCC)   A-fib (HCC)  Acute on chronic systolic CHF: Diuresing well.  1.6 L net negative.  Patient symptoms are improving.  Case discussed with cardiology.  Last 2D echocardiogram EF 25%.  Patient is very reluctant to take medications and does not wish for any more invasive procedures.   Plan:  Continue IV Lasix for today with plans to transition to p.o. Lasix tomorrow  Ins and outs, daily weights  Follow-up with cardiology  No plans for inpatient ischemic evaluation   Paroxysmal atrial fibrillation with RVR, tachybradycardia syndrome, sinus bradycardia: Heart rate is  better.  She is on metoprolol XL 12.5 mg twice daily.  Continue Eliquis.  Case discussed with cardiologist.  Anticipate discharge within 1 to 2 days.     Hypokalemia: Replete potassium and monitor levels.     CKD stage IV, metabolic acidosis: Patient had CKD stage IIIb but it appears she has progressed to CKD stage IV.  Creatinine improving with diuresis suggesting possible cardiorenal syndrome     CAD, PVD: She is off of Plavix.  Continue statins.     History of diffuse large cell lymphoma in remission   DVT prophylaxis: Eliquis Code Status: DNR Family Communication: None today Disposition Plan: Status is: Inpatient Remains inpatient appropriate because: Acute on chronic systolic congestive heart failure on IV diuretic   Level of care: Progressive  Consultants:  Cardiology-[facility]  Procedures:  None  Antimicrobials: None   Subjective: Seen and appear resting in bed.  Reports improvement in shortness of breath.  Objective: Vitals:   01/02/23 2344 01/03/23 0349 01/03/23 0811 01/03/23 1112  BP: (!) 148/62 (!) 150/72 (!) 147/72 133/61  Pulse: (!) 40 (!) 42 66 (!) 47  Resp: 20 20 17 19   Temp: (!) 97.5 F (36.4 C) 97.9 F (36.6 C) 97.6 F (36.4 C) 97.9 F (36.6 C)  TempSrc:   Oral Oral  SpO2: 96%  97% 100%  Weight:      Height:        Intake/Output Summary (Last 24 hours) at 01/03/2023 1546 Last data filed at 01/03/2023 1100 Gross per 24 hour  Intake 120 ml  Output 500 ml  Net -380 ml   Filed Weights   12/31/22 0501 01/01/23 0424 01/02/23 0500  Weight: 55 kg 56 kg 55.4 kg    Examination:  General exam: NAD.  Appears frail Respiratory system: Bibasilar crackles.  Normal work of breathing.  Room air Cardiovascular system: S1-2, RRR, no murmurs, no pedal edema Gastrointestinal system: Soft, NT/, normal bowel sounds Central nervous system: Alert and oriented. No focal neurological deficits. Extremities: Symmetric 5 x 5 power. Skin: No rashes, lesions or  ulcers Psychiatry: Judgement and insight appear normal. Mood & affect appropriate.     Data Reviewed: I have personally reviewed following labs and imaging studies  CBC: Recent Labs  Lab 12/30/22 0857 12/31/22 0428 01/01/23 0538  WBC 8.6 7.9 8.0  NEUTROABS 6.6  --   --   HGB 12.3 12.8 12.1  HCT 38.5 39.9 38.7  MCV 92.5 93.7 96.5  PLT 124* 109* 112*   Basic Metabolic Panel: Recent Labs  Lab 12/30/22 0857 12/30/22 1127 12/31/22 0428 01/01/23 0538 01/02/23 0630 01/03/23 0549  NA 143  --  144 140 140 142  K 3.2*  --  4.2 3.7 3.1* 4.1  CL 112*  --  114* 111 110 110  CO2 19*  --  19* 19* 20* 23  GLUCOSE 115*  --  101* 116* 133* 88  BUN 44*  --  41* 44* 45* 41*  CREATININE 2.02*  --  2.10* 2.08* 2.10* 1.89*  CALCIUM 8.6*  --  8.5* 8.0* 8.0* 8.2*  MG  --  2.0  --  2.3  --  2.0  PHOS  --  3.3  --   --   --   --    GFR: Estimated Creatinine Clearance: 20.6 mL/min (A) (by C-G formula based on SCr of 1.89 mg/dL (H)). Liver Function Tests: Recent Labs  Lab 12/30/22 0857  AST 20  ALT 15  ALKPHOS 137*  BILITOT 0.8  PROT 6.3*  ALBUMIN 3.2*   Recent Labs  Lab 12/30/22 0857  LIPASE 20   No results for input(s): "AMMONIA" in the last 168 hours. Coagulation Profile: Recent Labs  Lab 12/30/22 0858  INR 1.3*   Cardiac Enzymes: No results for input(s): "CKTOTAL", "CKMB", "CKMBINDEX", "TROPONINI" in the last 168 hours. BNP (last 3 results) No results for input(s): "PROBNP" in the last 8760 hours. HbA1C: No results for input(s): "HGBA1C" in the last 72 hours. CBG: No results for input(s): "GLUCAP" in the last 168 hours. Lipid Profile: No results for input(s): "CHOL", "HDL", "LDLCALC", "TRIG", "CHOLHDL", "LDLDIRECT" in the last 72 hours. Thyroid Function Tests: No results for input(s): "TSH", "T4TOTAL", "FREET4", "T3FREE", "THYROIDAB" in the last 72 hours. Anemia Panel: No results for input(s): "VITAMINB12", "FOLATE", "FERRITIN", "TIBC", "IRON", "RETICCTPCT" in the  last 72 hours. Sepsis Labs: Recent Labs  Lab 12/30/22 0857 12/30/22 1127  LATICACIDVEN 2.0* 1.8    Recent Results (from the past 240 hour(s))  SARS Coronavirus 2 by RT PCR (hospital order, performed in [facility] hospital lab) *cepheid single result test* Anterior Nasal Swab     Status: None   Collection Time: 12/30/22  9:23 AM   Specimen: Anterior Nasal Swab  Result Value Ref Range Status   SARS Coronavirus 2 by RT PCR NEGATIVE NEGATIVE Final    Comment: (NOTE) SARS-CoV-2 target nucleic acids are NOT DETECTED.  The SARS-CoV-2 RNA is generally detectable in upper and lower respiratory specimens during the acute phase of infection. The lowest concentration of SARS-CoV-2 viral copies this  assay can detect is 250 copies / mL. A negative result does not preclude SARS-CoV-2 infection and should not be used as the sole basis for treatment or other patient management decisions.  A negative result may occur with improper specimen collection / handling, submission of specimen other than nasopharyngeal swab, presence of viral mutation(s) within the areas targeted by this assay, and inadequate number of viral copies (<250 copies / mL). A negative result must be combined with clinical observations, patient history, and epidemiological information.  Fact Sheet for Patients:   RoadLapTop.co.za  Fact Sheet for Healthcare Providers: http://kim-miller.com/  This test is not yet approved or  cleared by the [place] FDA and has been authorized for detection and/or diagnosis of SARS-CoV-2 by FDA under an Emergency Use Authorization (EUA).  This EUA will remain in effect (meaning this test can be used) for the duration of the COVID-19 declaration under Section 564(b)(1) of the Act, 21 U.S.C. section 360bbb-3(b)(1), unless the authorization is terminated or revoked sooner.  Performed at [facility], [address]  [place], [place] [address]   Blood culture (routine x 2)     Status: None (Preliminary result)   Collection Time: 12/30/22 11:27 AM   Specimen: BLOOD  Result Value Ref Range Status   Specimen Description BLOOD LEFT ANTECUBITAL  Final   Special Requests   Final    BOTTLES DRAWN AEROBIC AND ANAEROBIC Blood Culture adequate volume   Culture   Final    NO GROWTH 4 DAYS Performed at [facility], [address], [place], [place] [address]    Report Status PENDING  Incomplete  Blood culture (routine x 2)     Status: None (Preliminary result)   Collection Time: 12/30/22 11:27 AM   Specimen: BLOOD  Result Value Ref Range Status   Specimen Description BLOOD RIGHT ANTECUBITAL  Final   Special Requests   Final    BOTTLES DRAWN AEROBIC AND ANAEROBIC Blood Culture adequate volume   Culture   Final    NO GROWTH 4 DAYS Performed at [facility], [address] [place], [place] [address]    Report Status PENDING  Incomplete         Radiology Studies: No results found.      Scheduled Meds:  apixaban  2.5 mg Oral BID   atorvastatin  80 mg Oral Daily   [START ON 01/04/2023] furosemide  20 mg Oral Daily   [START ON 01/04/2023] metoprolol succinate  12.5 mg Oral Daily   nicotine  14 mg Transdermal Daily   pantoprazole  40 mg Oral Daily   senna-docusate  1 tablet Oral BID   sodium chloride flush  3 mL Intravenous Q12H   Continuous Infusions:  sodium chloride       LOS: 4 days     Tresa Moore, MD [facility]   If 7PM-7AM, please contact night-coverage  01/03/2023, 3:46 PM

## 2023-01-03 NOTE — Plan of Care (Signed)
?  Problem: Education: Goal: Knowledge of General Education information will improve Description: Including pain rating scale, medication(s)/side effects and non-pharmacologic comfort measures Outcome: Progressing   Problem: Health Behavior/Discharge Planning: Goal: Ability to manage health-related needs will improve Outcome: Progressing   Problem: Clinical Measurements: Goal: Will remain free from infection Outcome: Progressing   

## 2023-01-03 NOTE — Progress Notes (Signed)
  Chaplain On-Call received a page from [employer], who reported the patient's request to complete her HCPOA document.  Chaplain met the patient; daughter [name]; and granddaughter at bedside.  The patient stated her desire to complete the document. Chaplain contacted Employee Notary [name] and Hospital Volunteers Onalee Hua and Sharl Ma to assist the HCPOA process.  However, when we were at patient's bedside, she stated "I can't sign this". Chaplain asked her to elaborate, but the patient did not. The HCPOA document is not completed.  Chaplain reported this experience to RN Randa Evens, who will assess the patient. Chaplain will refer this matter to the Afternoon Chaplain.  Chaplain Evelena Peat M.Div., [facility]

## 2023-01-04 DIAGNOSIS — I5023 Acute on chronic systolic (congestive) heart failure: Secondary | ICD-10-CM | POA: Diagnosis not present

## 2023-01-04 DIAGNOSIS — I5021 Acute systolic (congestive) heart failure: Secondary | ICD-10-CM

## 2023-01-04 LAB — BASIC METABOLIC PANEL
Anion gap: 10 (ref 5–15)
BUN: 43 mg/dL — ABNORMAL HIGH (ref 8–23)
CO2: 22 mmol/L (ref 22–32)
Calcium: 8.1 mg/dL — ABNORMAL LOW (ref 8.9–10.3)
Chloride: 106 mmol/L (ref 98–111)
Creatinine, Ser: 1.78 mg/dL — ABNORMAL HIGH (ref 0.44–1.00)
GFR, Estimated: 29 mL/min — ABNORMAL LOW (ref >60)
Glucose, Bld: 104 mg/dL — ABNORMAL HIGH (ref 70–99)
Potassium: 3.6 mmol/L (ref 3.5–5.1)
Sodium: 138 mmol/L (ref 135–145)

## 2023-01-04 LAB — CBC WITH DIFFERENTIAL/PLATELET
Abs Immature Granulocytes: 0.04 10*3/uL (ref 0.00–0.07)
Basophils Absolute: 0.1 10*3/uL (ref 0.0–0.1)
Basophils Relative: 1 %
Eosinophils Absolute: 0.1 10*3/uL (ref 0.0–0.5)
Eosinophils Relative: 1 %
HCT: 39.7 % (ref 36.0–46.0)
Hemoglobin: 12.7 g/dL (ref 12.0–15.0)
Immature Granulocytes: 0 %
Lymphocytes Relative: 13 %
Lymphs Abs: 1.2 10*3/uL (ref 0.7–4.0)
MCH: 28.9 pg (ref 26.0–34.0)
MCHC: 32 g/dL (ref 30.0–36.0)
MCV: 90.2 fL (ref 80.0–100.0)
Monocytes Absolute: 0.9 10*3/uL (ref 0.1–1.0)
Monocytes Relative: 10 %
Neutro Abs: 6.6 10*3/uL (ref 1.7–7.7)
Neutrophils Relative %: 75 %
Platelets: 120 10*3/uL — ABNORMAL LOW (ref 150–400)
RBC: 4.4 MIL/uL (ref 3.87–5.11)
RDW: 17 % — ABNORMAL HIGH (ref 11.5–15.5)
WBC: 8.9 10*3/uL (ref 4.0–10.5)
nRBC: 0 % (ref 0.0–0.2)

## 2023-01-04 LAB — CULTURE, BLOOD (ROUTINE X 2)
Culture: NO GROWTH
Culture: NO GROWTH
Special Requests: ADEQUATE
Special Requests: ADEQUATE

## 2023-01-04 MED ORDER — ENSURE ENLIVE PO LIQD: 237.0000 mL | Freq: Two times a day (BID) | ORAL | Status: DC

## 2023-01-04 MED ORDER — FUROSEMIDE 40 MG PO TABS: 40.0000 mg | ORAL_TABLET | Freq: Every day | ORAL | Status: DC

## 2023-01-04 MED ORDER — INFLUENZA VAC A&B SURF ANT ADJ 0.5 ML IM SUSY
0.5000 mL | PREFILLED_SYRINGE | INTRAMUSCULAR | Status: DC
  Filled 2023-01-04: qty 0.5

## 2023-01-04 MED ORDER — ATORVASTATIN CALCIUM 80 MG PO TABS: 80.0000 mg | ORAL_TABLET | Freq: Every day | ORAL | 0 refills | Status: AC

## 2023-01-04 MED ORDER — FUROSEMIDE 20 MG PO TABS: 20.0000 mg | ORAL_TABLET | Freq: Once | ORAL | Status: DC

## 2023-01-04 MED ORDER — METOPROLOL SUCCINATE ER 25 MG PO TB24: 12.5000 mg | ORAL_TABLET | Freq: Every day | ORAL | 0 refills | Status: AC

## 2023-01-04 MED ORDER — APIXABAN 2.5 MG PO TABS: 2.5000 mg | ORAL_TABLET | Freq: Two times a day (BID) | ORAL | 0 refills | Status: AC

## 2023-01-04 MED ORDER — FUROSEMIDE 40 MG PO TABS: 40.0000 mg | ORAL_TABLET | Freq: Every day | ORAL | 0 refills | Status: AC

## 2023-01-04 NOTE — Discharge Summary (Signed)
Physician Discharge Summary  Ashley Lynch AVW:[id number] DOB: Jan 06, 1946 DOA: 12/30/2022  PCP: Margaretann Loveless, MD  Admit date: 12/30/2022 Discharge date: 01/04/2023  Admitted From: Home Disposition:  Home  Recommendations for Outpatient Follow-up:  Follow up with PCP in 1-2 weeks Follow-up with cardiology  Home Health: No, home health recommended patient declined Equipment/Devices: None  Discharge Condition: Stable CODE STATUS: DNR Diet recommendation: Heart healthy  Brief/Interim Summary: 77 y.o. female with medical history significant for CAD, PVD, chronic systolic CHF w ith EF of 25%, CKD stage IV, diffuse large cell lymphoma in remission, hypertension, recent hospitalization for CHF exacerbation from 11/30/2022 through 12/03/2022 (left AMA on that visit).  She presented to the hospital because of generalized weakness, fatigue, shortness of breath, lower extremity swelling, slow and fast heart rate and decreased oxygen saturation.  Her daughter said that her pulse at home was low in the 50s and then it later jumped into the 140s.  Apparently, oxygen saturation dropped into the 80s and she required 2 L of oxygen to bring Her oxygen saturation to 91%.  It is also reported that patient has not been medically adherent and has not been taking her diuretics.   Her heart rate was in the 140s in the emergency department.  She was found to have atrial fibrillation with rapid ventricular response and CHF exacerbation.  She was treated with IV metoprolol and digoxin.  Heart rate went down to into the 40s.  She eventually converted to normal sinus rhythm.   9/19: Stable at time of discharge.  Lengthy discussion between the patient's granddaughter, the patient, cardiologist.  Patient has repeatedly refused to take medications.  She was refused ICD despite critically low ejection fraction.  However she also is declining admission hospice services at that time.  Goal-directed medical therapy has been  prescribed.  Patient medication adherence is highly in question.  She remains high risk for subsequent deterioration and  hospital readmission.   Discharge Diagnoses:  Principal Problem:   CHF (congestive heart failure) (HCC) Active Problems:   Acute systolic (congestive) heart failure (HCC)   A-fib (HCC)   Acute on chronic systolic CHF: Diuresing well.  1.6 L net negative.  Patient symptoms are improving.  Case discussed with cardiology.  Last 2D echocardiogram EF 25%.  Patient is very reluctant to take medications and does not wish for any more invasive procedures.   Plan:  Lasix 40 mg p.o. daily, statin, beta-blocker.  Attempt to stress the importance of medication adherence and regular cardiology follow-up.   Paroxysmal atrial fibrillation with RVR, tachybradycardia syndrome, sinus bradycardia: Heart rate is better.  She is on metoprolol XL 12.5 mg twice daily.  Continue Eliquis.      Hypokalemia: Replete potassium and monitor levels.     CKD stage IV, metabolic acidosis: Patient had CKD stage IIIb but it appears she has progressed to CKD stage IV.  Creatinine improving with diuresis suggesting possible cardiorenal syndrome     CAD, PVD: She is off of Plavix.  Continue statins.     History of diffuse large cell lymphoma in remission  Discharge Instructions  Discharge Instructions     Diet - low sodium heart healthy   Complete by: As directed    Increase activity slowly   Complete by: As directed    No wound care   Complete by: As directed       Allergies as of 01/04/2023   No Known Allergies      Medication List  STOP taking these medications    clopidogrel 75 MG tablet Commonly known as: PLAVIX   sacubitril-valsartan 24-26 MG Commonly known as: ENTRESTO   sertraline 50 MG tablet Commonly known as: ZOLOFT   traMADol 50 MG tablet Commonly known as: ULTRAM       TAKE these medications    apixaban 2.5 MG Tabs tablet Commonly known as:  ELIQUIS Take 1 tablet (2.5 mg total) by mouth 2 (two) times daily.   aspirin 81 MG tablet Take 81 mg by mouth daily.   atorvastatin 80 MG tablet Commonly known as: LIPITOR Take 1 tablet (80 mg total) by mouth daily. What changed: when to take this   furosemide 40 MG tablet Commonly known as: LASIX Take 1 tablet (40 mg total) by mouth daily. Start taking on: January 05, 2023 What changed:  medication strength how much to take   Gemtesa 75 MG Tabs Generic drug: Vibegron Take 75 mg by mouth daily.   metoprolol succinate 25 MG 24 hr tablet Commonly known as: TOPROL-XL Take 0.5 tablets (12.5 mg total) by mouth daily. Start taking on: January 05, 2023 What changed: how much to take   multivitamin with minerals Tabs tablet Take 1 tablet by mouth daily.   Nicoderm CQ 14 MG/24HR patch Generic drug: nicotine Place 1 patch onto the skin daily.   nitroGLYCERIN 0.4 MG SL tablet Commonly known as: NITROSTAT Place 0.4 tablets under the tongue daily as needed.   omeprazole 20 MG capsule Commonly known as: PRILOSEC Take 1 capsule (20 mg total) by mouth daily.               Durable Medical Equipment  (From admission, onward)           Start     Ordered   01/04/23 1106  For home use only DME Hospital bed  Once       Question Answer Comment  Length of Need Lifetime   The above medical condition requires: Patient requires the ability to reposition frequently   Bed type Semi-electric      01/04/23 1105            Follow-up Information     Callwood, Dwayne D, MD. Go in 1 week(s).   Specialties: Cardiology, Internal Medicine Contact information: [address] [place] [address] [phone]                No Known Allergies  Consultations: Cardiology   Procedures/Studies: DG Chest 1 View  Result Date: 12/31/2022 CLINICAL DATA:  Congestive heart failure. EXAM: CHEST  1 VIEW COMPARISON:  12/30/2022 FINDINGS: The cardio  pericardial silhouette is enlarged. Bibasilar collapse/consolidation noted, right greater than left with moderate right pleural effusion. Diffuse interstitial opacity likely chronic although superimposed component of interstitial edema not excluded. Bones are diffusely demineralized. Telemetry leads overlie the chest. IMPRESSION: 1. No substantial change. 2. Bibasilar collapse/consolidation, right greater than left with moderate right pleural effusion. 3. Diffuse interstitial opacity likely chronic although superimposed component of interstitial edema not excluded. Electronically Signed   By: Kennith Center M.D.   On: 12/31/2022 07:30   DG Chest 2 View  Result Date: 12/30/2022 CLINICAL DATA:  Cough, dyspnea. EXAM: CHEST - 2 VIEW COMPARISON:  Chest radiograph dated 12/01/2022. FINDINGS: The heart size is borderline enlarged. Vascular calcifications are seen in the aortic arch. There is a moderate right pleural effusion with associated atelectasis/airspace disease. There is no left pleural effusion. There is mild left basilar atelectasis/airspace disease. Mild diffuse bilateral  interstitial opacities are seen. No pneumothorax. IMPRESSION: Moderate right pleural effusion with associated atelectasis/airspace disease. Mild left basilar atelectasis/airspace disease. Mild diffuse bilateral interstitial opacities may represent pulmonary edema. Electronically Signed   By: Romona Curls M.D.   On: 12/30/2022 09:26      Subjective: Seen and examined on the day of discharge.  Stable no distress.  Appropriate discharge home.  Discharge Exam: Vitals:   01/04/23 0848 01/04/23 1125  BP: (!) 165/72 (!) 151/71  Pulse: 66 76  Resp: 16 16  Temp: 97.7 F (36.5 C) 97.8 F (36.6 C)  SpO2: 91% (!) 89%   Vitals:   01/04/23 0349 01/04/23 0354 01/04/23 0848 01/04/23 1125  BP: (!) 149/67  (!) 165/72 (!) 151/71  Pulse: (!) 51  66 76  Resp: 18  16 16   Temp: 97.6 F (36.4 C)  97.7 F (36.5 C) 97.8 F (36.6 C)   TempSrc:      SpO2: 99%  91% (!) 89%  Weight:  55.2 kg    Height:        General: Pt is alert, awake, not in acute distress Cardiovascular: RRR, S1/S2 +, no rubs, no gallops Respiratory: CTA bilaterally, no wheezing, no rhonchi Abdominal: Soft, NT, ND, bowel sounds + Extremities: no edema, no cyanosis    The results of significant diagnostics from this hospitalization (including imaging, microbiology, ancillary and laboratory) are listed below for reference.     Microbiology: Recent Results (from the past 240 hour(s))  SARS Coronavirus 2 by RT PCR (hospital order, performed in [facility] hospital lab) *cepheid single result test* Anterior Nasal Swab     Status: None   Collection Time: 12/30/22  9:23 AM   Specimen: Anterior Nasal Swab  Result Value Ref Range Status   SARS Coronavirus 2 by RT PCR NEGATIVE NEGATIVE Final    Comment: (NOTE) SARS-CoV-2 target nucleic acids are NOT DETECTED.  The SARS-CoV-2 RNA is generally detectable in upper and lower respiratory specimens during the acute phase of infection. The lowest concentration of SARS-CoV-2 viral copies this assay can detect is 250 copies / mL. A negative result does not preclude SARS-CoV-2 infection and should not be used as the sole basis for treatment or other patient management decisions.  A negative result may occur with improper specimen collection / handling, submission of specimen other than nasopharyngeal swab, presence of viral mutation(s) within the areas targeted by this assay, and inadequate number of viral copies (<250 copies / mL). A negative result must be combined with clinical observations, patient history, and epidemiological information.  Fact Sheet for Patients:   RoadLapTop.co.za  Fact Sheet for Healthcare Providers: http://kim-miller.com/  This test is not yet approved or  cleared by the [place] FDA and has been authorized for detection and/or  diagnosis of SARS-CoV-2 by FDA under an Emergency Use Authorization (EUA).  This EUA will remain in effect (meaning this test can be used) for the duration of the COVID-19 declaration under Section 564(b)(1) of the Act, 21 U.S.C. section 360bbb-3(b)(1), unless the authorization is terminated or revoked sooner.  Performed at [facility], [address] [place], [place] [address]   Blood culture (routine x 2)     Status: None   Collection Time: 12/30/22 11:27 AM   Specimen: BLOOD  Result Value Ref Range Status   Specimen Description BLOOD LEFT ANTECUBITAL  Final   Special Requests   Final    BOTTLES DRAWN AEROBIC AND ANAEROBIC Blood Culture adequate volume   Culture  Final    NO GROWTH 5 DAYS Performed at [facility], [address] [place], [place] [address]    Report Status 01/04/2023 FINAL  Final  Blood culture (routine x 2)     Status: None   Collection Time: 12/30/22 11:27 AM   Specimen: BLOOD  Result Value Ref Range Status   Specimen Description BLOOD RIGHT ANTECUBITAL  Final   Special Requests   Final    BOTTLES DRAWN AEROBIC AND ANAEROBIC Blood Culture adequate volume   Culture   Final    NO GROWTH 5 DAYS Performed at [facility], [address] [place], [place] [address]    Report Status 01/04/2023 FINAL  Final     Labs: BNP (last 3 results) Recent Labs    12/01/22 0050 12/30/22 0857  BNP >4,500.0* >4,500.0*   Basic Metabolic Panel: Recent Labs  Lab 12/30/22 1127 12/31/22 0428 01/01/23 0538 01/02/23 0630 01/03/23 0549 01/04/23 0924  NA  --  144 140 140 142 138  K  --  4.2 3.7 3.1* 4.1 3.6  CL  --  114* 111 110 110 106  CO2  --  19* 19* 20* 23 22  GLUCOSE  --  101* 116* 133* 88 104*  BUN  --  41* 44* 45* 41* 43*  CREATININE  --  2.10* 2.08* 2.10* 1.89* 1.78*  CALCIUM  --  8.5* 8.0* 8.0* 8.2* 8.1*  MG 2.0  --  2.3  --  2.0  --   PHOS 3.3  --   --   --   --   --    Liver Function Tests: Recent Labs  Lab  12/30/22 0857  AST 20  ALT 15  ALKPHOS 137*  BILITOT 0.8  PROT 6.3*  ALBUMIN 3.2*   Recent Labs  Lab 12/30/22 0857  LIPASE 20   No results for input(s): "AMMONIA" in the last 168 hours. CBC: Recent Labs  Lab 12/30/22 0857 12/31/22 0428 01/01/23 0538 01/04/23 0924  WBC 8.6 7.9 8.0 8.9  NEUTROABS 6.6  --   --  6.6  HGB 12.3 12.8 12.1 12.7  HCT 38.5 39.9 38.7 39.7  MCV 92.5 93.7 96.5 90.2  PLT 124* 109* 112* 120*   Cardiac Enzymes: No results for input(s): "CKTOTAL", "CKMB", "CKMBINDEX", "TROPONINI" in the last 168 hours. BNP: Invalid input(s): "POCBNP" CBG: No results for input(s): "GLUCAP" in the last 168 hours. D-Dimer No results for input(s): "DDIMER" in the last 72 hours. Hgb A1c No results for input(s): "HGBA1C" in the last 72 hours. Lipid Profile No results for input(s): "CHOL", "HDL", "LDLCALC", "TRIG", "CHOLHDL", "LDLDIRECT" in the last 72 hours. Thyroid function studies No results for input(s): "TSH", "T4TOTAL", "T3FREE", "THYROIDAB" in the last 72 hours.  Invalid input(s): "FREET3" Anemia work up No results for input(s): "VITAMINB12", "FOLATE", "FERRITIN", "TIBC", "IRON", "RETICCTPCT" in the last 72 hours. Urinalysis    Component Value Date/Time   COLORURINE YELLOW (A) 12/30/2022 1030   APPEARANCEUR HAZY (A) 12/30/2022 1030   LABSPEC 1.019 12/30/2022 1030   PHURINE 5.0 12/30/2022 1030   GLUCOSEU NEGATIVE 12/30/2022 1030   HGBUR LARGE (A) 12/30/2022 1030   BILIRUBINUR NEGATIVE 12/30/2022 1030   KETONESUR NEGATIVE 12/30/2022 1030   PROTEINUR 100 (A) 12/30/2022 1030   NITRITE POSITIVE (A) 12/30/2022 1030   LEUKOCYTESUR TRACE (A) 12/30/2022 1030   Sepsis Labs Recent Labs  Lab 12/30/22 0857 12/31/22 0428 01/01/23 0538 01/04/23 0924  WBC 8.6 7.9 8.0 8.9   Microbiology Recent Results (from the past  240 hour(s))  SARS Coronavirus 2 by RT PCR (hospital order, performed in [facility] hospital lab) *cepheid single result test* Anterior Nasal Swab      Status: None   Collection Time: 12/30/22  9:23 AM   Specimen: Anterior Nasal Swab  Result Value Ref Range Status   SARS Coronavirus 2 by RT PCR NEGATIVE NEGATIVE Final    Comment: (NOTE) SARS-CoV-2 target nucleic acids are NOT DETECTED.  The SARS-CoV-2 RNA is generally detectable in upper and lower respiratory specimens during the acute phase of infection. The lowest concentration of SARS-CoV-2 viral copies this assay can detect is 250 copies / mL. A negative result does not preclude SARS-CoV-2 infection and should not be used as the sole basis for treatment or other patient management decisions.  A negative result may occur with improper specimen collection / handling, submission of specimen other than nasopharyngeal swab, presence of viral mutation(s) within the areas targeted by this assay, and inadequate number of viral copies (<250 copies / mL). A negative result must be combined with clinical observations, patient history, and epidemiological information.  Fact Sheet for Patients:   RoadLapTop.co.za  Fact Sheet for Healthcare Providers: http://kim-miller.com/  This test is not yet approved or  cleared by the [place] FDA and has been authorized for detection and/or diagnosis of SARS-CoV-2 by FDA under an Emergency Use Authorization (EUA).  This EUA will remain in effect (meaning this test can be used) for the duration of the COVID-19 declaration under Section 564(b)(1) of the Act, 21 U.S.C. section 360bbb-3(b)(1), unless the authorization is terminated or revoked sooner.  Performed at [facility], [address] [place], [place] [address]   Blood culture (routine x 2)     Status: None   Collection Time: 12/30/22 11:27 AM   Specimen: BLOOD  Result Value Ref Range Status   Specimen Description BLOOD LEFT ANTECUBITAL  Final   Special Requests   Final    BOTTLES DRAWN AEROBIC AND ANAEROBIC Blood Culture  adequate volume   Culture   Final    NO GROWTH 5 DAYS Performed at [facility], [address] [place], [place] [address]    Report Status 01/04/2023 FINAL  Final  Blood culture (routine x 2)     Status: None   Collection Time: 12/30/22 11:27 AM   Specimen: BLOOD  Result Value Ref Range Status   Specimen Description BLOOD RIGHT ANTECUBITAL  Final   Special Requests   Final    BOTTLES DRAWN AEROBIC AND ANAEROBIC Blood Culture adequate volume   Culture   Final    NO GROWTH 5 DAYS Performed at [facility], [address], [place], [place] [address]    Report Status 01/04/2023 FINAL  Final     Time coordinating discharge: Over 30 minutes  SIGNED:   Tresa Moore, MD  [facility] 01/04/2023, 2:37 PM Pager   If 7PM-7AM, please contact night-coverage

## 2023-01-04 NOTE — TOC Initial Note (Signed)
Transition of Care [facility]) - Initial/Assessment Note    Patient Details  Name: Ashley Lynch MRN: [id number] Date of Birth: 1945-08-02  Transition of Care [facility]) CM/SW Contact:    Truddie Hidden, RN Phone Number: 01/04/2023, 11:10 AM  Clinical Narrative:                 Admitted for: CHF   Admitted from: Home. Grand Daughter Fredric Mare lives with her. Pharmacy: [facility]. No difficulty obtaining medication Current home health/prior home health/DME: [facility] Transportation: [employer] with patient and her granddaughter regarding therapy's recommendation for [facility] PT. Patient refused HH.       Patient Goals and CMS Choice            Expected Discharge Plan and Services                                              Prior Living Arrangements/Services                       Activities of Daily Living Home Assistive Devices/Equipment: None ADL Screening (condition at time of admission) Patient's cognitive ability adequate to safely complete daily activities?: No Is the patient deaf or have difficulty hearing?: Yes Does the patient have difficulty seeing, even when wearing glasses/contacts?: No Does the patient have difficulty concentrating, remembering, or making decisions?: Yes Patient able to express need for assistance with ADLs?: Yes Does the patient have difficulty dressing or bathing?: Yes Independently performs ADLs?: No Communication: Needs assistance Is this a change from baseline?: Pre-admission baseline Dressing (OT): Needs assistance Is this a change from baseline?: Pre-admission baseline Grooming: Needs assistance Is this a change from baseline?: Pre-admission baseline Feeding: Needs assistance Is this a change from baseline?: Pre-admission baseline Bathing: Needs assistance Is this a change from baseline?: Pre-admission baseline Toileting: Needs assistance Is this a change from baseline?: Pre-admission baseline In/Out Bed: Needs  assistance Is this a change from baseline?: Pre-admission baseline Walks in Home: Needs assistance Is this a change from baseline?: Pre-admission baseline Does the patient have difficulty walking or climbing stairs?: Yes Weakness of Legs: Both Weakness of Arms/Hands: Both  Permission Sought/Granted                  Emotional Assessment              Admission diagnosis:  Hypokalemia [E87.6] CHF (congestive heart failure) (HCC) [I50.9] Weakness [R53.1] Acute on chronic systolic congestive heart failure (HCC) [I50.23] Atrial fibrillation, unspecified type (HCC) [I48.91] Patient Active Problem List   Diagnosis Date Noted   A-fib (HCC) 12/30/2022   CHF (congestive heart failure) (HCC) 12/30/2022   CHF exacerbation (HCC) 12/01/2022   Acute systolic (congestive) heart failure (HCC) 12/01/2022   Pressure injury of skin 12/20/2021   Acute on chronic systolic CHF (congestive heart failure) (HCC) 12/19/2021   Ischemic cardiomyopathy 12/19/2021   HFrEF (heart failure with reduced ejection fraction) (HCC) 04/23/2021   Coronary artery disease 01/13/2020   Acute renal failure superimposed on stage 3b chronic kidney disease (HCC) 01/12/2020   Anemia 01/12/2020   Atherosclerosis of native arteries of extremity with intermittent claudication (HCC) 11/15/2018   Carotid stenosis 09/18/2017   TIA (transient ischemic attack) 06/28/2017   Chronic low back pain with left-sided sciatica 10/24/2016   PVD (peripheral vascular disease) (HCC) 05/01/2016   Right carotid  bruit 05/01/2016   Pedal edema 05/01/2016   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HCC) 01/12/2016   Chest pain 11/01/2015   Health maintenance examination 05/27/2015   Depression 05/27/2015   Colon cancer screening 05/27/2015   Decreased hearing of left ear 05/27/2015   Left leg swelling 05/27/2015   Neuropathic pain, leg 01/01/2015   Left leg pain 01/01/2015   Essential hypertension 12/23/2014   Heartburn 11/18/2014    Hypertension 11/18/2014   Urinary frequency 11/18/2014   Nicotine dependence 11/18/2014   History of lymphoma 08/17/2014   PCP:  Margaretann Loveless, MD Pharmacy:   [phone] [place], [place] - [address] [place] [address] Phone: [phone] Fax: [phone]  [facility] - [place], [place] - [address] [place] [address] Phone: [phone] Fax: [phone]  [id number] [place], [place] - [address] AT [facility] [address] [place] [address] [place] [address] Phone: [phone] Fax: [phone]     Social Determinants of Health (SDOH) Social History: SDOH Screenings   Food Insecurity: No Food Insecurity (01/03/2023)  Housing: Patient Declined (01/03/2023)  Transportation Needs: No Transportation Needs (01/03/2023)  Utilities: Not At Risk (01/03/2023)  Financial Resource Strain: Low Risk  (04/24/2021)   Received from [facility]  Tobacco Use: High Risk (12/30/2022)   SDOH Interventions:     Readmission Risk Interventions    01/03/2023    3:30 PM  Readmission Risk Prevention Plan  Transportation Screening Complete  PCP or Specialist Appt within 3-5 Days Complete  HRI or Home Care Consult Complete  Social Work Consult for Recovery Care Planning/Counseling Complete  Palliative Care Screening Not Complete  Medication Review Oceanographer) Complete

## 2023-01-04 NOTE — TOC Transition Note (Signed)
Transition of Care [facility]) - CM/SW Discharge Note   Patient Details  Name: JALEEZA SHIMP MRN: [id number] Date of Birth: [birth year]  [facility] CM/SW Contact:  Truddie Hidden, RN Phone Number: 01/04/2023, 3:15 PM   Clinical Narrative:    Spoke with patient's granddaughter regarding discharge and DME. She was advised [employer] is processing the hospital bed and would contact her for any associated fees. She will transport the patient home. Patient previously refused HH.   TOC signing off.          Patient Goals and CMS Choice      Discharge Placement                         Discharge Plan and Services Additional resources added to the After Visit Summary for                                       Social Determinants of Health (SDOH) Interventions SDOH Screenings   Food Insecurity: No Food Insecurity (01/03/2023)  Housing: Patient Declined (01/03/2023)  Transportation Needs: No Transportation Needs (01/03/2023)  Utilities: Not At Risk (01/03/2023)  Financial Resource Strain: Low Risk  (04/24/2021)   Received from [facility], [facility]  Tobacco Use: High Risk (12/30/2022)     Readmission Risk Interventions    01/03/2023    3:30 PM  Readmission Risk Prevention Plan  Transportation Screening Complete  PCP or Specialist Appt within 3-5 Days Complete  HRI or Home Care Consult Complete  Social Work Consult for Recovery Care Planning/Counseling Complete  Palliative Care Screening Not Complete  Medication Review Oceanographer) Complete

## 2023-01-04 NOTE — Progress Notes (Signed)
Palliative:  HPI: 77 y.o. female  with past medical history of multivessel CAD s/p PCI and angioplasty, ischemic cardiomyopathy, chronic HFrEF with EF 25-30%, CKD stage 3b, PVD, HLD admitted from home on 12/30/2022 with shortness of breath and worsening LE edema. Admitted and being treated for new onset A. Fib and CXR revealing pulmonary congestion. Noted recent admit last month for acute on chronic CHF with concern for medication non-adherence, appears Ms. Gomm left AMA. Palliative medicine was consulted for assisting with goals of care conversations.    Much discussion with unit director as well as Ms. Cochrane and family at bedside. Daughter, Clydie Braun, has provided HCPOA that appears legally standing. I spoke with Ms. Hafen who is more alert and interactive today although still weak and tired. She tells me "Clydie Braun needs to stay out of my business." I asked family to step out of the room so I could speak with Ms. Pizzolato privately. I asked Ms. Chay who she wants to make decisions for her if she cannot and she tells me very clearly that she wishes for [name] to make decisions and then [place]. I asked family to come back in room and provided HCPOA document for them to make update to her current wishes. Chaplain notified to assist to coordinate notary and witness.   I also discussed with them concern for overall poor prognosis and high risk of further health decline. Ms. Wieseler understands and accepts that she is in poor health and prognosis is poor. She wishes to be at home. She does feel much better than she did. We discussed hospice support and Ms. Varon is much more open and willing to consider than family originally anticipated but she does not wish for hospice services at this time. She does tell us that she would be accepting of hospice in the future if needed.   All questions/concerns addressed. Emotional support provided.   Exam: More alert, awake, oriented and able to converse today. Shortness of breath at rest  improved. Abd soft. Generalized weakness and poor reserve.   Plan: - DNR/DNI - Not ready for hospice but open to hospice support in the future - Chaplain consulted to update HCPOA per Ms. Fenley wishes  55 min  Yong Channel, NP Palliative Medicine Team Pager [phone] (Please see [email or web address] for schedule) Team Phone [phone]    Greater than 50%  of this time was spent counseling and coordinating care related to the above assessment and plan

## 2023-01-04 NOTE — Progress Notes (Signed)
Patient has CHF which requires frequent body change positioning in ways not feasible with a normal bed. Head must be elevated at intermittently or shortness of breath ,difficulty breathing,or edema may occur. CHF frequently and  upon awakening requires changes in body position which cannot be achieved with a normal bed.

## 2023-01-04 NOTE — Progress Notes (Signed)
[facility] Cardiology  CARDIOLOGY PROGRESS NOTE  Patient ID: Ashley Lynch MRN: [id number] DOB/AGE: November 06, 1945 77 y.o.  Admit date: 12/30/2022 Referring Physician [facility] Primary Physician  Primary Cardiologist  Reason for Consultation congestive heart failure  HPI: 77 year old female referred for evaluation of congestive heart failure.  Patient has history of coronary artery disease, ischemic cardiomyopathy, and chronic HFrEF.  She also has history of chronic kidney disease stage IV, and diffuse large cell lymphoma in remission.  She was recently hospitalized for acute HFrEF 11/30/2022 and left AMA on 12/03/2022.  Reviewing outside records, the patient has not had consistent cardiology follow-up in the past 1 to 2 years.  Patient's daughter and granddaughter in the room.  Apparently the patient is unable to get up off the floor, called EMS, and was brought to [facility] ED primarily for tachycardia, where ECG revealed atrial fibrillation with rapid ventricular rate.  The patient was treated with intravenous metoprolol tartrate and digoxin, and the patient converted to sinus rhythm, and remains in sinus rhythm at 77 bpm.  Upon questioning, patient denies chest pain.  Troponin mildly elevated 203, 222 without ischemic ECG changes.  The patient has a history of NSTEMI 01/13/2020, was transferred to [facility] where she underwent PCI with stents to LAD and left circumflex, with residual chronically occluded RCA.  Most recent 2D echocardiogram 12/01/2022 revealed LVEF 25%.  AMP greater than 4500.  Interval history: Discussed at length with granddaughter regarding goals of care. Patient does not go to doctor's appointments, she does not take her medications at home, and she refuses ICD for cardiomyopathy. I recommend hospice/palliative care. No further recommendations from cardiac standpoint. We will sign off at this time. Please reach out with any questions or concerns.   Review of systems complete and found to be negative unless  listed above     Past Medical History:  Diagnosis Date   Cancer (HCC)    lymphoma   Cardiomyopathy (HCC)    Complication of anesthesia    slow to wake   H/O heart artery stent 12/2019   X2   HFrEF (heart failure with reduced ejection fraction) (HCC)    History of chicken pox    History of measles, mumps, or rubella    HTN (hypertension)    Iliac artery occlusion (HCC)    Bilateral   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)    Myocardial infarction (HCC) 04/23/2021   NSTEMI   PAD (peripheral artery disease) [facility])     Past Surgical History:  Procedure Laterality Date   ABDOMINAL HYSTERECTOMY  04/18/2011   CARDIAC CATHETERIZATION  04/25/2021   CATARACT EXTRACTION W/PHACO Left 09/20/2021   Procedure: CATARACT EXTRACTION PHACO AND INTRAOCULAR LENS PLACEMENT (IOC) LEFT  18.07 01:25.3;  Surgeon: Galen Manila, MD;  Location: [facility];  Service: Ophthalmology;  Laterality: Left;   CATARACT EXTRACTION W/PHACO Right 10/04/2021   Procedure: CATARACT EXTRACTION PHACO AND INTRAOCULAR LENS PLACEMENT (IOC) RIGHT  5.68 00:44.8;  Surgeon: Galen Manila, MD;  Location: [facility];  Service: Ophthalmology;  Laterality: Right;   GALLBLADDER SURGERY  1975 1981   LEFT HEART CATH AND CORONARY ANGIOGRAPHY N/A 01/13/2020   Procedure: LEFT HEART CATH AND CORONARY ANGIOGRAPHY;  Surgeon: Laurier Nancy, MD;  Location: [facility] INVASIVE CV LAB;  Service: Cardiovascular;  Laterality: N/A;   TONSILLECTOMY  04/17/2008    Medications Prior to Admission  Medication Sig Dispense Refill Last Dose   aspirin 81 MG tablet Take 81 mg by mouth daily. (Patient not taking: Reported on 09/19/2021)  atorvastatin (LIPITOR) 80 MG tablet Take 80 mg by mouth. (Patient not taking: Reported on 09/19/2021)      clopidogrel (PLAVIX) 75 MG tablet Take 1 tablet (75 mg total) by mouth daily. (Patient not taking: Reported on 12/30/2022) 30 tablet 1 Not Taking   furosemide (LASIX) 20 MG tablet TAKE 1 TABLET BY MOUTH EVERY  DAY (Patient not taking: Reported on 12/30/2022) 30 tablet 1 Not Taking   GEMTESA 75 MG TABS Take 75 mg by mouth daily. (Patient not taking: Reported on 12/30/2022)   Not Taking   metoprolol succinate (TOPROL-XL) 25 MG 24 hr tablet Take 1 tablet (25 mg total) by mouth daily. (Patient not taking: Reported on 12/30/2022) 30 tablet 1 Not Taking   Multiple Vitamin (MULTIVITAMIN WITH MINERALS) TABS tablet Take 1 tablet by mouth daily. (Patient not taking: Reported on 12/30/2022) 30 tablet 1 Not Taking   NICODERM CQ 14 MG/24HR patch Place 1 patch onto the skin daily.      nitroGLYCERIN (NITROSTAT) 0.4 MG SL tablet Place 0.4 tablets under the tongue daily as needed. (Patient not taking: Reported on 12/30/2022)   Not Taking   omeprazole (PRILOSEC) 20 MG capsule Take 1 capsule (20 mg total) by mouth daily. (Patient not taking: Reported on 12/30/2022) 90 capsule 3 Not Taking   sacubitril-valsartan (ENTRESTO) 24-26 MG Take 1 tablet by mouth 2 (two) times daily. (Patient not taking: Reported on 12/30/2022) 60 tablet 1 Not Taking   sertraline (ZOLOFT) 50 MG tablet TAKE 1 TABLET BY MOUTH EVERY DAY (Patient not taking: Reported on 12/30/2022) 90 tablet 2 Not Taking   traMADol (ULTRAM) 50 MG tablet Take 1 tablet (50 mg total) by mouth 3 (three) times daily as needed. (Patient not taking: Reported on 12/30/2022) 30 tablet 0 Not Taking   Social History   Socioeconomic History   Marital status: Widowed    Spouse name: Not on file   Number of children: Not on file   Years of education: Not on file   Highest education level: Not on file  Occupational History   Not on file  Tobacco Use   Smoking status: Every Day    Current packs/day: 1.00    Average packs/day: 1 pack/day for 60.0 years (60.0 ttl pk-yrs)    Types: Cigarettes   Smokeless tobacco: Never  Vaping Use   Vaping status: Never Used  Substance and Sexual Activity   Alcohol use: No   Drug use: No   Sexual activity: Not on file  Other Topics Concern   Not  on file  Social History Narrative   Not on file   Social Determinants of Health   Financial Resource Strain: Low Risk  (04/24/2021)   Received from [facility]   Overall Financial Resource Strain (CARDIA)    Difficulty of Paying Living Expenses: Not hard at all  Food Insecurity: No Food Insecurity (01/03/2023)   Hunger Vital Sign    Worried About Running Out of Food in the Last Year: Never true    Ran Out of Food in the Last Year: Never true  Transportation Needs: No Transportation Needs (01/03/2023)   PRAPARE - Administrator, Civil Service (Medical): No    Lack of Transportation (Non-Medical): No  Physical Activity: Not on file  Stress: Not on file  Social Connections: Not on file  Intimate Partner Violence: Not At Risk (01/03/2023)   Humiliation, Afraid, Rape, and Kick questionnaire    Fear of Current or Ex-Partner: No  Emotionally Abused: No    Physically Abused: No    Sexually Abused: No    Family History  Problem Relation Age of Onset   Diabetes Mother        sibling and grandparent also      Review of systems complete and found to be negative unless listed above    PHYSICAL EXAM General: Chronically-ill appearing, in no acute distress resting in hospital bed with granddaughter present at bedside. HEENT:  Normocephalic and atramatic Neck:  No JVD.  Lungs: Clear bilaterally to auscultation and percussion. Heart: HRRR . Normal S1 and S2 without gallops or murmurs.  Abdomen: Bowel sounds are positive, abdomen soft and non-tender  Msk:  Back normal, normal gait. Normal strength and tone for age. Extremities: No clubbing, cyanosis or edema.   Neuro: Alert and oriented X 3. Psych:  Good affect, responds appropriately  Labs:   Lab Results  Component Value Date   WBC 8.9 01/04/2023   HGB 12.7 01/04/2023   HCT 39.7 01/04/2023   MCV 90.2 01/04/2023   PLT 120 (L) 01/04/2023    Recent Labs  Lab 12/30/22 0857 12/31/22 0428  01/04/23 0924  NA 143   < > 138  K 3.2*   < > 3.6  CL 112*   < > 106  CO2 19*   < > 22  BUN 44*   < > 43*  CREATININE 2.02*   < > 1.78*  CALCIUM 8.6*   < > 8.1*  PROT 6.3*  --   --   BILITOT 0.8  --   --   ALKPHOS 137*  --   --   ALT 15  --   --   AST 20  --   --   GLUCOSE 115*   < > 104*   < > = values in this interval not displayed.   Lab Results  Component Value Date   TROPONINI <0.03 06/28/2017    Lab Results  Component Value Date   CHOL 130 01/13/2020   CHOL 180 06/29/2017   CHOL 190 06/19/2016   Lab Results  Component Value Date   HDL 43 01/13/2020   HDL 36 (L) 06/29/2017   HDL 35 (L) 06/19/2016   Lab Results  Component Value Date   LDLCALC 70 01/13/2020   LDLCALC 109 (H) 06/29/2017   LDLCALC 127 (H) 06/19/2016   Lab Results  Component Value Date   TRIG 83 01/13/2020   TRIG 176 (H) 06/29/2017   TRIG 141 06/19/2016   Lab Results  Component Value Date   CHOLHDL 3.0 01/13/2020   CHOLHDL 5.0 06/29/2017   CHOLHDL 5.4 (H) 06/19/2016   No results found for: "LDLDIRECT"    Radiology: DG Chest 1 View  Result Date: 12/31/2022 CLINICAL DATA:  Congestive heart failure. EXAM: CHEST  1 VIEW COMPARISON:  12/30/2022 FINDINGS: The cardio pericardial silhouette is enlarged. Bibasilar collapse/consolidation noted, right greater than left with moderate right pleural effusion. Diffuse interstitial opacity likely chronic although superimposed component of interstitial edema not excluded. Bones are diffusely demineralized. Telemetry leads overlie the chest. IMPRESSION: 1. No substantial change. 2. Bibasilar collapse/consolidation, right greater than left with moderate right pleural effusion. 3. Diffuse interstitial opacity likely chronic although superimposed component of interstitial edema not excluded. Electronically Signed   By: Kennith Center M.D.   On: 12/31/2022 07:30   DG Chest 2 View  Result Date: 12/30/2022 CLINICAL DATA:  Cough, dyspnea. EXAM: CHEST - 2 VIEW  COMPARISON:  Chest radiograph dated  12/01/2022. FINDINGS: The heart size is borderline enlarged. Vascular calcifications are seen in the aortic arch. There is a moderate right pleural effusion with associated atelectasis/airspace disease. There is no left pleural effusion. There is mild left basilar atelectasis/airspace disease. Mild diffuse bilateral interstitial opacities are seen. No pneumothorax. IMPRESSION: Moderate right pleural effusion with associated atelectasis/airspace disease. Mild left basilar atelectasis/airspace disease. Mild diffuse bilateral interstitial opacities may represent pulmonary edema. Electronically Signed   By: Romona Curls M.D.   On: 12/30/2022 09:26    EKG: Atrial fibrillation at a rate of 116 bpm  Telemetry reviewed (01/04/23): sinus rhythm   Data reviewed (01/04/23): last 24h vitals tele labs imaging I/O hospitalist progress note, palliative care note  ASSESSMENT AND PLAN:   1.  Acute on chronic HFrEF, with LVEF less than 25%, with probable medical noncompliance, patient admits to taking Entresto once a day, does not have consistent cardiology follow up. No documented UOP yesterday. 2.  Coronary artery disease, status post NSTEMI 01/13/2020, status post stents LAD and left circumflex with residual occluded RCA, currently without chest pain 3.  Mildly elevated high-sensitivity troponin (203, 222) in the absence of chest pain, likely demand supply ischemia, secondary to acute on chronic HFrEF, with underlying CKD, stage IV 4.  Paroxysmal atrial fibrillation, not chronically anticoagulated 5.  Diffuse large cell lymphoma in remission 6. Medical noncompliance, per patient's daughter she often does not take her medications and does not like to go to doctors appointments.  Recommendations  Palliative medicine is following - patient would qualify for hospice care given her preferences of limiting interventions however she is not interested at this time.   Cardiology will  sign off at this time. I discussed at length with patient's granddaughter regarding goals of care. Patient is non-compliant with medications and she does not go to her cardiology appointments. She has refused ICD despite LVEF 25%. She does not take her medications at home. Maintain on Lasix 40 mg qDay, Eliquis 2.5 mg BID, Toprol XL 12.5 mg qDay, and Lipitor 80 mg at bedtime. Patient can be discharged on this regiment.  No further recommendations.   Signed: Clotilde Dieter, DO  01/04/2023, 12:48 PM

## 2023-01-04 NOTE — Progress Notes (Deleted)
Advanced Heart Failure Clinic Note   Referring Physician: PCP: Ashley Loveless, MD Cardiologist: Ashley Peng, MD (to be seen 10/24)  HPI:   Ashley Lynch is a 77 y/o female with a history of  Admitted 11/30/22 due to 1 week of shortness of breath, orthopnea and bilateral lower extremity edema. Troponin 82 and BNP over 4500. CXR showed Patchy/interstitial opacities in the lungs bilaterally, likely interstitial edema. Had not been taking diuretic PTA. Current Echo LVEF 25%. Started on IV lasix 40 BID, received 5 doses with improvement in her dyspnea.  However, Cr started trending up with diuresis, so diuretic held. Left AMA.   Admitted 12/30/22 due to generalized weakness, fatigue, shortness of breath, lower extremity swelling, slow and fast heart rate and decreased oxygen saturation. Reported that patient has not been medically adherent and has not been taking her diuretics. Her heart rate was in the 140s in the emergency department. She was found to have atrial fibrillation with rapid ventricular response and CHF exacerbation. Treated with IV metoprolol and digoxin. She eventually converted to normal sinus rhythm. Patient is very reluctant to take medications and does not wish for any more invasive procedures but is not wanting hospice services.   Chest CTA 05/11/21: No evidence of acute pulmonary embolus.  Cardiomegaly and pulmonary edema with moderate bilateral pleural effusions.  Small volume gas is seen within the liver which extends is somewhat to the periphery. Findings can be seen in the setting of portal venous gas or may reflect pneumobilia. Recommend CT of the abdomen and pelvis and correlation with lactate.  Dilation of the main pulmonary artery measuring up to 3.8 cm. Findings can be seen in the setting of pulmonary hypertension.  Mild ectasia of the ascending thoracic aorta measuring up to 4 cm.   Echo 06/29/17: EF 55-60% Echo 01/13/20: EF 50-55% with moderate LVH, Grade I DD Echo  12/20/21: EF 25-30% with Grade I DD, moderate LAE/ RAE,  moderate MR/TR and large left lateral pleural effusion Echo 12/01/22: EF 25% with mild LVH, mildly elevated PA pressure 43.9 mmHg, moderate LAE, moderate TR and mod/severe MR  LHC 01/13/20:  Mid RCA lesion is 100% stenosed. Prox RCA lesion is 70% stenosed. Prox LAD to Mid LAD lesion is 80% stenosed. Prox Cx to Mid Cx lesion is 70% stenosed.  Three-vessel coronary artery disease with occluded distal RCA and high-grade mid LAD lesion and borderline left ventricular systolic function and severe peripheral vascular disease with occluded right iliac in the midportion.  Evaluate for CABG at [facility]  Review of Systems: [y] = yes, [ ]  = no   General: Weight gain [ ] ; Weight loss [ ] ; Anorexia [ ] ; Fatigue [ ] ; Fever [ ] ; Chills [ ] ; Weakness [ ]   Cardiac: Chest pain/pressure [ ] ; Resting SOB [ ] ; Exertional SOB [ ] ; Orthopnea [ ] ; Pedal Edema [ ] ; Palpitations [ ] ; Syncope [ ] ; Presyncope [ ] ; Paroxysmal nocturnal dyspnea[ ]   Pulmonary: Cough [ ] ; Wheezing[ ] ; Hemoptysis[ ] ; Sputum [ ] ; Snoring [ ]   GI: Vomiting[ ] ; Dysphagia[ ] ; Melena[ ] ; Hematochezia [ ] ; Heartburn[ ] ; Abdominal pain [ ] ; Constipation [ ] ; Diarrhea [ ] ; BRBPR [ ]   GU: Hematuria[ ] ; Dysuria [ ] ; Nocturia[ ]   Vascular: Pain in legs with walking [ ] ; Pain in feet with lying flat [ ] ; Non-healing sores [ ] ; Stroke [ ] ; TIA [ ] ; Slurred speech [ ] ;  Neuro: Headaches[ ] ; Vertigo[ ] ; Seizures[ ] ; Paresthesias[ ] ;Blurred vision [ ] ;  Diplopia [ ] ; Vision changes [ ]   Ortho/Skin: Arthritis [ ] ; Joint pain [ ] ; Muscle pain [ ] ; Joint swelling [ ] ; Back Pain [ ] ; Rash [ ]   Psych: Depression[ ] ; Anxiety[ ]   Heme: Bleeding problems [ ] ; Clotting disorders [ ] ; Anemia [ ]   Endocrine: Diabetes [ ] ; Thyroid dysfunction[ ]    Past Medical History:  Diagnosis Date   Cancer (HCC)    lymphoma   Cardiomyopathy (HCC)    Complication of anesthesia    slow to wake   H/O heart artery stent  12/2019   X2   HFrEF (heart failure with reduced ejection fraction) (HCC)    History of chicken pox    History of measles, mumps, or rubella    HTN (hypertension)    Iliac artery occlusion (HCC)    Bilateral   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)    Myocardial infarction (HCC) 04/23/2021   NSTEMI   PAD (peripheral artery disease) (HCC)     Current Outpatient Medications  Medication Sig Dispense Refill   apixaban (ELIQUIS) 2.5 MG TABS tablet Take 1 tablet (2.5 mg total) by mouth 2 (two) times daily. 60 tablet 0   aspirin 81 MG tablet Take 81 mg by mouth daily. (Patient not taking: Reported on 09/19/2021)     atorvastatin (LIPITOR) 80 MG tablet Take 1 tablet (80 mg total) by mouth daily. 30 tablet 0   [START ON 01/05/2023] furosemide (LASIX) 40 MG tablet Take 1 tablet (40 mg total) by mouth daily. 30 tablet 0   GEMTESA 75 MG TABS Take 75 mg by mouth daily. (Patient not taking: Reported on 12/30/2022)     [START ON 01/05/2023] metoprolol succinate (TOPROL-XL) 25 MG 24 hr tablet Take 0.5 tablets (12.5 mg total) by mouth daily. 15 tablet 0   Multiple Vitamin (MULTIVITAMIN WITH MINERALS) TABS tablet Take 1 tablet by mouth daily. (Patient not taking: Reported on 12/30/2022) 30 tablet 1   NICODERM CQ 14 MG/24HR patch Place 1 patch onto the skin daily.     nitroGLYCERIN (NITROSTAT) 0.4 MG SL tablet Place 0.4 tablets under the tongue daily as needed. (Patient not taking: Reported on 12/30/2022)     omeprazole (PRILOSEC) 20 MG capsule Take 1 capsule (20 mg total) by mouth daily. (Patient not taking: Reported on 12/30/2022) 90 capsule 3   No current facility-administered medications for this visit.   Facility-Administered Medications Ordered in Other Visits  Medication Dose Route Frequency Provider Last Rate Last Admin   0.9 %  sodium chloride infusion  250 mL Intravenous PRN Ashley College T, MD       acetaminophen (TYLENOL) tablet 650 mg  650 mg Oral Q4H PRN Ashley College T, MD   650 mg at 01/03/23 1947   apixaban  (ELIQUIS) tablet 2.5 mg  2.5 mg Oral BID Paraschos, Alexander, MD   2.5 mg at 01/04/23 1016   atorvastatin (LIPITOR) tablet 80 mg  80 mg Oral Daily Lurene Shadow, MD   80 mg at 01/04/23 1016   feeding supplement (ENSURE ENLIVE / ENSURE PLUS) liquid 237 mL  237 mL Oral BID BM Sreenath, Sudheer B, MD       furosemide (LASIX) tablet 20 mg  20 mg Oral Once Lowella Bandy, [facility]       [START ON 01/05/2023] furosemide (LASIX) tablet 40 mg  40 mg Oral Daily Custovic, Sabina, DO       hydrocortisone cream 0.5 %   Topical PRN Emeline General, MD   Given at  01/03/23 2103   influenza vaccine adjuvanted (FLUAD) injection 0.5 mL  0.5 mL Intramuscular Tomorrow-1000 Sreenath, Sudheer B, MD       metoprolol succinate (TOPROL-XL) 24 hr tablet 12.5 mg  12.5 mg Oral Daily Hudson, Caralyn, PA-C   12.5 mg at 01/04/23 1015   metoprolol tartrate (LOPRESSOR) injection 2.5 mg  2.5 mg Intravenous Q4H PRN Ashley College T, MD   2.5 mg at 12/31/22 0028   nicotine (NICODERM CQ - dosed in mg/24 hours) patch 14 mg  14 mg Transdermal Daily Ashley College T, MD   14 mg at 01/04/23 1014   ondansetron (ZOFRAN) injection 4 mg  4 mg Intravenous Q6H PRN Ashley College T, MD       pantoprazole (PROTONIX) EC tablet 40 mg  40 mg Oral Daily Lurene Shadow, MD   40 mg at 01/04/23 1016   polyethylene glycol (MIRALAX / GLYCOLAX) packet 17 g  17 g Oral Daily PRN Lolita Patella B, MD       senna-docusate (Senokot-S) tablet 1 tablet  1 tablet Oral BID Lolita Patella B, MD   1 tablet at 01/04/23 1016   sodium chloride flush (NS) 0.9 % injection 3 mL  3 mL Intravenous Q12H Ashley College T, MD   3 mL at 01/03/23 2103   sodium chloride flush (NS) 0.9 % injection 3 mL  3 mL Intravenous PRN Emeline General, MD        No Known Allergies    Social History   Socioeconomic History   Marital status: Widowed    Spouse name: Not on file   Number of children: Not on file   Years of education: Not on file   Highest education level: Not on file  Occupational  History   Not on file  Tobacco Use   Smoking status: Every Day    Current packs/day: 1.00    Average packs/day: 1 pack/day for 60.0 years (60.0 ttl pk-yrs)    Types: Cigarettes   Smokeless tobacco: Never  Vaping Use   Vaping status: Never Used  Substance and Sexual Activity   Alcohol use: No   Drug use: No   Sexual activity: Not on file  Other Topics Concern   Not on file  Social History Narrative   Not on file   Social Determinants of Health   Financial Resource Strain: Low Risk  (04/24/2021)   Received from [facility], [facility]   Overall Financial Resource Strain (CARDIA)    Difficulty of Paying Living Expenses: Not hard at all  Food Insecurity: No Food Insecurity (01/03/2023)   Hunger Vital Sign    Worried About Running Out of Food in the Last Year: Never true    Ran Out of Food in the Last Year: Never true  Transportation Needs: No Transportation Needs (01/03/2023)   PRAPARE - Administrator, Civil Service (Medical): No    Lack of Transportation (Non-Medical): No  Physical Activity: Not on file  Stress: Not on file  Social Connections: Not on file  Intimate Partner Violence: Not At Risk (01/03/2023)   Humiliation, Afraid, Rape, and Kick questionnaire    Fear of Current or Ex-Partner: No    Emotionally Abused: No    Physically Abused: No    Sexually Abused: No      Family History  Problem Relation Age of Onset   Diabetes Mother        sibling and grandparent also       PHYSICAL  EXAM: General:  Well appearing. No respiratory difficulty HEENT: normal Neck: supple. no JVD. Carotids 2+ bilat; no bruits. No lymphadenopathy or thyromegaly appreciated. Cor: PMI nondisplaced. Regular rate & rhythm. No rubs, gallops or murmurs. Lungs: clear Abdomen: soft, nontender, nondistended. No hepatosplenomegaly. No bruits or masses. Good bowel sounds. Extremities: no cyanosis, clubbing, rash, edema Neuro: alert & oriented x 3, cranial nerves grossly  intact. moves all 4 extremities w/o difficulty. Affect pleasant.  ECG:   ASSESSMENT & PLAN:  1: Chronic heart failure with reduced ejection fraction- - suspect ? atrial fibrillation - NYHA class - euvolemic - weighing daily - Echo 06/29/17: EF 55-60% - Echo 01/13/20: EF 50-55% with moderate LVH, Grade I DD - Echo 12/20/21: EF 25-30% with Grade I DD, moderate LAE/ RAE,  moderate MR/TR and large left lateral pleural effusion - Echo 12/01/22: EF 25% with mild LVH, mildly elevated PA pressure 43.9 mmHg, moderate LAE, moderate TR and mod/severe MR - continue  - BNP 12/30/22 was >4500.0  2: Atrial fibrillation- - to see cardiology Juliann Pares) 10/24 - continue  3: HTN- - BP - saw PCP - BMP 01/04/23 showed sodium 138, potassium 3.6, creatinine 1.78 & GFR 29  4: CAD- - LHC 01/13/20:  Mid RCA lesion is 100% stenosed. Prox RCA lesion is 70% stenosed. Prox LAD to Mid LAD lesion is 80% stenosed. Prox Cx to Mid Cx lesion is 70% stenosed.  Three-vessel coronary artery disease with occluded distal RCA and high-grade mid LAD lesion and borderline left ventricular systolic function and severe peripheral vascular disease with occluded right iliac in the midportion.  Evaluate [facility]G at [facility]  5: PAD- - saw vascular ([facility]) 10/2018

## 2023-01-04 NOTE — Progress Notes (Signed)
Patient now refusing the flu vaccine. Educated patient and family on the importance of this vaccine in relation to pts chronic illnessess . Patient and family now requesting hh pt eval. Informed md. Patient ready for discharge. Family at bedside. Reviewd medications and f/u appointments.

## 2023-01-04 NOTE — Progress Notes (Signed)
Initial Nutrition Assessment  DOCUMENTATION CODES:   Not applicable  INTERVENTION:   -Continue Ensure Enlive po BID, each supplement provides 350 kcal and 20 grams of protein.  -Continue MVI with minerals daily  -Continue dysphagia 3 diet with thin liquids   NUTRITION DIAGNOSIS:   Increased nutrient needs related to chronic illness (CHF) as evidenced by estimated needs.  GOAL:   Patient will meet greater than or equal to 90% of their needs  MONITOR:   PO intake, Supplement acceptance  REASON FOR ASSESSMENT:   Malnutrition Screening Tool    ASSESSMENT:   Pt with medical history significant for CAD, PVD, chronic systolic CHF with EF of 25%, CKD stage IV, diffuse large cell lymphoma in remission, hypertension, recent hospitalization for CHF exacerbation (left AMA on that visit).  She presented to the hospital because of generalized weakness, fatigue, shortness of breath, lower extremity swelling, slow and fast heart rate and decreased oxygen saturation.  Pt admitted with CHF.   9/19- s/p BSE- dysphagia 3 diet with thin liquids   Reviewed I/O's: -680 ml x 24 hours and -2.5 L since admission  UOP: 900 ml x 24 hours   Palliative care following for goals of care; pt considering hospice. Noted pt has been resistant to interventions and with medication noncompliance.   Attempted to speak with pt, however, receiving personal care at time of visit.   Pt is currently on a dysphagia 3 diet. Noted meal completions 25-100%. Ensure supplements have been ordered.   Reviewed wt hx; wt has been stable over the past year. Pt with moderate edema, which may be masking true weight loss as well as fat and muscle depletions.   Medications reviewed and include lasix.   Labs reviewed: CBGS: 126.   Diet Order:   Diet Order             DIET DYS 3 Room service appropriate? Yes; Fluid consistency: Thin  Diet effective now                   EDUCATION NEEDS:   Education needs have  been addressed  Skin:  Skin Assessment: Reviewed RN Assessment  Last BM:  01/03/23  Height:   Ht Readings from Last 1 Encounters:  12/30/22 5\' 3"  (1.6 m)    Weight:   Wt Readings from Last 1 Encounters:  01/04/23 55.2 kg    Ideal Body Weight:  52.3 kg  BMI:  Body mass index is 21.56 kg/m.  Estimated Nutritional Needs:   Kcal:  1400-1600  Protein:  65-80 grams  Fluid:  > 1.4 L    Levada Schilling, RD, LDN, CDCES Registered Dietitian II Certified Diabetes Care and Education Specialist Please refer to [facility] for RD and/or RD on-call/weekend/after hours pager

## 2023-01-04 NOTE — Progress Notes (Signed)
   01/04/23 1100  Spiritual Encounters  Type of Visit Follow up  Care provided to: Pt and family  Reason for visit Advance directives  OnCall Visit Yes   Chaplain received page for advance directive to be notarized. Patient and grand daughter would like to wait until the patients daughter is back at 1 pm. They will page the chaplain once she arrives.

## 2023-01-05 ENCOUNTER — Telehealth: Payer: Self-pay | Admitting: *Deleted

## 2023-01-05 ENCOUNTER — Telehealth: Payer: Self-pay

## 2023-01-05 DIAGNOSIS — R531 Weakness: Secondary | ICD-10-CM | POA: Diagnosis not present

## 2023-01-05 DIAGNOSIS — I4891 Unspecified atrial fibrillation: Secondary | ICD-10-CM

## 2023-01-05 DIAGNOSIS — I509 Heart failure, unspecified: Secondary | ICD-10-CM

## 2023-01-05 NOTE — Consult Note (Signed)
[facility]) Accountable Care Organization [facility])  [facility] Liaison Note  01/05/2023  [name] 07-20-1945 [id number]  Location: [facility] Liaison screened the patient remotely at [facility].  Insurance: [facility] HMO   Ashley Lynch is a 78 y.o. female who is a Primary Care Patient of Margaretann Loveless, MD with [facility]. The patient was screened for 30 day readmission hospitalization with noted high risk score for unplanned readmission risk with 2 IP/1 ED in 6 months.  The patient was assessed for potential [facility]) Care Management service needs for post hospital transition for care coordination. Review of patient's electronic medical record reveals patient was admitted for CHF. Pt discharged home with self-care. Will make a referral for care management services (RN care coordinator) based upon her readmits and risk for readmission.    VBCI Care Management/Population Health does not replace or interfere with any arrangements made by the Inpatient Transition of Care team.   For questions contact:   Elliot Cousin, RN, [facility] Liaison [place]   Population Health Office Hours MTWF  8:00 am-6:00 pm [phone] mobile [phone] [Office toll free line] Office Hours are M-F 8:30 - 5 pm [email or web address]

## 2023-01-05 NOTE — Progress Notes (Signed)
Care Coordination   Note   01/05/2023 Name: Ashley Lynch MRN: [id number] DOB: 1945-09-17  Ashley Lynch is a 77 y.o. year old female who sees Margaretann Loveless, MD for primary care. I reached out to [name] by phone today to offer care coordination services.  Ms. Kaser was given information about Care Coordination services today including:   The Care Coordination services include support from the care team which includes your Nurse Coordinator, Clinical Social Worker, or Pharmacist.  The Care Coordination team is here to help remove barriers to the health concerns and goals most important to you. Care Coordination services are voluntary, and the patient may decline or stop services at any time by request to their care team member.   Care Coordination Consent Status: Patient agreed to services and verbal consent obtained.   Follow up plan:  Telephone appointment with care coordination team member scheduled for:  01/15/2023  Encounter Outcome:  Patient Scheduled from referral   Burman Nieves, [facility] Care Coordination Care Guide Direct Dial: [phone]

## 2023-01-08 ENCOUNTER — Encounter: Payer: Medicare PPO | Admitting: Family

## 2023-01-08 ENCOUNTER — Telehealth: Payer: Self-pay | Admitting: Internal Medicine

## 2023-01-08 ENCOUNTER — Telehealth: Payer: Self-pay | Admitting: Family

## 2023-01-08 NOTE — Telephone Encounter (Signed)
Patient did not show for her initial Heart Failure Clinic appointment on 01/08/23.

## 2023-01-08 NOTE — Telephone Encounter (Signed)
The hospital called and they need a referral for hospice care or home health. We need to call the daughter and see what her exact needs are and what services she is needing.

## 2023-01-09 ENCOUNTER — Emergency Department: Payer: Medicare PPO

## 2023-01-09 ENCOUNTER — Inpatient Hospital Stay
Admission: EM | Admit: 2023-01-09 | Discharge: 2023-01-12 | DRG: 291 | Disposition: A | Discharge status: Hospice | Payer: Medicare PPO | Attending: Student | Admitting: Student

## 2023-01-09 ENCOUNTER — Other Ambulatory Visit: Payer: Self-pay | Admitting: Family

## 2023-01-09 DIAGNOSIS — Z7982 Long term (current) use of aspirin: Secondary | ICD-10-CM

## 2023-01-09 DIAGNOSIS — I482 Chronic atrial fibrillation, unspecified: Secondary | ICD-10-CM | POA: Diagnosis present

## 2023-01-09 DIAGNOSIS — J449 Chronic obstructive pulmonary disease, unspecified: Secondary | ICD-10-CM | POA: Diagnosis not present

## 2023-01-09 DIAGNOSIS — Z515 Encounter for palliative care: Secondary | ICD-10-CM

## 2023-01-09 DIAGNOSIS — I5023 Acute on chronic systolic (congestive) heart failure: Secondary | ICD-10-CM | POA: Diagnosis present

## 2023-01-09 DIAGNOSIS — I2510 Atherosclerotic heart disease of native coronary artery without angina pectoris: Secondary | ICD-10-CM | POA: Diagnosis present

## 2023-01-09 DIAGNOSIS — Z955 Presence of coronary angioplasty implant and graft: Secondary | ICD-10-CM | POA: Diagnosis not present

## 2023-01-09 DIAGNOSIS — Z833 Family history of diabetes mellitus: Secondary | ICD-10-CM

## 2023-01-09 DIAGNOSIS — Z66 Do not resuscitate: Secondary | ICD-10-CM | POA: Diagnosis not present

## 2023-01-09 DIAGNOSIS — I252 Old myocardial infarction: Secondary | ICD-10-CM | POA: Diagnosis not present

## 2023-01-09 DIAGNOSIS — R918 Other nonspecific abnormal finding of lung field: Secondary | ICD-10-CM | POA: Diagnosis not present

## 2023-01-09 DIAGNOSIS — S3992XA Unspecified injury of lower back, initial encounter: Secondary | ICD-10-CM | POA: Diagnosis not present

## 2023-01-09 DIAGNOSIS — N179 Acute kidney failure, unspecified: Secondary | ICD-10-CM | POA: Diagnosis not present

## 2023-01-09 DIAGNOSIS — F17210 Nicotine dependence, cigarettes, uncomplicated: Secondary | ICD-10-CM | POA: Diagnosis present

## 2023-01-09 DIAGNOSIS — E876 Hypokalemia: Secondary | ICD-10-CM | POA: Diagnosis present

## 2023-01-09 DIAGNOSIS — M79605 Pain in left leg: Secondary | ICD-10-CM | POA: Diagnosis not present

## 2023-01-09 DIAGNOSIS — I509 Heart failure, unspecified: Secondary | ICD-10-CM | POA: Diagnosis not present

## 2023-01-09 DIAGNOSIS — Z90710 Acquired absence of both cervix and uterus: Secondary | ICD-10-CM | POA: Diagnosis not present

## 2023-01-09 DIAGNOSIS — M25552 Pain in left hip: Secondary | ICD-10-CM | POA: Diagnosis present

## 2023-01-09 DIAGNOSIS — S22080A Wedge compression fracture of T11-T12 vertebra, initial encounter for closed fracture: Secondary | ICD-10-CM | POA: Diagnosis not present

## 2023-01-09 DIAGNOSIS — E785 Hyperlipidemia, unspecified: Secondary | ICD-10-CM | POA: Diagnosis present

## 2023-01-09 DIAGNOSIS — Z8572 Personal history of non-Hodgkin lymphomas: Secondary | ICD-10-CM | POA: Diagnosis not present

## 2023-01-09 DIAGNOSIS — R0989 Other specified symptoms and signs involving the circulatory and respiratory systems: Secondary | ICD-10-CM | POA: Diagnosis not present

## 2023-01-09 DIAGNOSIS — Z9889 Other specified postprocedural states: Secondary | ICD-10-CM

## 2023-01-09 DIAGNOSIS — I110 Hypertensive heart disease with heart failure: Secondary | ICD-10-CM | POA: Diagnosis not present

## 2023-01-09 DIAGNOSIS — M4856XA Collapsed vertebra, not elsewhere classified, lumbar region, initial encounter for fracture: Secondary | ICD-10-CM | POA: Diagnosis not present

## 2023-01-09 DIAGNOSIS — Z7901 Long term (current) use of anticoagulants: Secondary | ICD-10-CM

## 2023-01-09 DIAGNOSIS — I255 Ischemic cardiomyopathy: Secondary | ICD-10-CM | POA: Diagnosis present

## 2023-01-09 DIAGNOSIS — Z79899 Other long term (current) drug therapy: Secondary | ICD-10-CM | POA: Diagnosis not present

## 2023-01-09 DIAGNOSIS — Z48813 Encounter for surgical aftercare following surgery on the respiratory system: Secondary | ICD-10-CM | POA: Diagnosis not present

## 2023-01-09 DIAGNOSIS — I700 Atherosclerosis of aorta: Secondary | ICD-10-CM | POA: Diagnosis not present

## 2023-01-09 DIAGNOSIS — Y92009 Unspecified place in unspecified non-institutional (private) residence as the place of occurrence of the external cause: Secondary | ICD-10-CM | POA: Diagnosis not present

## 2023-01-09 DIAGNOSIS — I739 Peripheral vascular disease, unspecified: Secondary | ICD-10-CM | POA: Diagnosis present

## 2023-01-09 DIAGNOSIS — R531 Weakness: Secondary | ICD-10-CM | POA: Diagnosis not present

## 2023-01-09 DIAGNOSIS — J918 Pleural effusion in other conditions classified elsewhere: Secondary | ICD-10-CM | POA: Diagnosis not present

## 2023-01-09 DIAGNOSIS — R0602 Shortness of breath: Secondary | ICD-10-CM | POA: Diagnosis not present

## 2023-01-09 DIAGNOSIS — I130 Hypertensive heart and chronic kidney disease with heart failure and stage 1 through stage 4 chronic kidney disease, or unspecified chronic kidney disease: Principal | ICD-10-CM | POA: Diagnosis present

## 2023-01-09 DIAGNOSIS — Z7401 Bed confinement status: Secondary | ICD-10-CM

## 2023-01-09 DIAGNOSIS — M545 Low back pain, unspecified: Secondary | ICD-10-CM | POA: Diagnosis not present

## 2023-01-09 DIAGNOSIS — W19XXXA Unspecified fall, initial encounter: Principal | ICD-10-CM | POA: Diagnosis present

## 2023-01-09 DIAGNOSIS — J90 Pleural effusion, not elsewhere classified: Secondary | ICD-10-CM | POA: Diagnosis not present

## 2023-01-09 DIAGNOSIS — M4854XA Collapsed vertebra, not elsewhere classified, thoracic region, initial encounter for fracture: Secondary | ICD-10-CM | POA: Diagnosis present

## 2023-01-09 DIAGNOSIS — I517 Cardiomegaly: Secondary | ICD-10-CM | POA: Diagnosis not present

## 2023-01-09 DIAGNOSIS — N1832 Chronic kidney disease, stage 3b: Secondary | ICD-10-CM | POA: Diagnosis not present

## 2023-01-09 DIAGNOSIS — M25551 Pain in right hip: Secondary | ICD-10-CM | POA: Diagnosis present

## 2023-01-09 DIAGNOSIS — S0990XA Unspecified injury of head, initial encounter: Secondary | ICD-10-CM | POA: Diagnosis not present

## 2023-01-09 DIAGNOSIS — C8330 Diffuse large B-cell lymphoma, unspecified site: Secondary | ICD-10-CM

## 2023-01-09 DIAGNOSIS — M79604 Pain in right leg: Secondary | ICD-10-CM | POA: Diagnosis not present

## 2023-01-09 DIAGNOSIS — K5730 Diverticulosis of large intestine without perforation or abscess without bleeding: Secondary | ICD-10-CM | POA: Diagnosis not present

## 2023-01-09 LAB — BASIC METABOLIC PANEL
Anion gap: 11 (ref 5–15)
BUN: 28 mg/dL — ABNORMAL HIGH (ref 8–23)
CO2: 23 mmol/L (ref 22–32)
Calcium: 8.1 mg/dL — ABNORMAL LOW (ref 8.9–10.3)
Chloride: 108 mmol/L (ref 98–111)
Creatinine, Ser: 1.45 mg/dL — ABNORMAL HIGH (ref 0.44–1.00)
GFR, Estimated: 37 mL/min — ABNORMAL LOW (ref >60)
Glucose, Bld: 94 mg/dL (ref 70–99)
Potassium: 3.1 mmol/L — ABNORMAL LOW (ref 3.5–5.1)
Sodium: 142 mmol/L (ref 135–145)

## 2023-01-09 LAB — URINALYSIS, ROUTINE W REFLEX MICROSCOPIC
Bilirubin Urine: NEGATIVE
Glucose, UA: NEGATIVE mg/dL
Ketones, ur: 5 mg/dL — AB
Leukocytes,Ua: NEGATIVE
Nitrite: NEGATIVE
Protein, ur: 30 mg/dL — AB
RBC / HPF: >50 RBC/hpf (ref 0–5)
Specific Gravity, Urine: 1.016 (ref 1.005–1.030)
pH: 6 (ref 5.0–8.0)

## 2023-01-09 LAB — CBC WITH DIFFERENTIAL/PLATELET
Abs Immature Granulocytes: 0.02 10*3/uL (ref 0.00–0.07)
Basophils Absolute: 0.1 10*3/uL (ref 0.0–0.1)
Basophils Relative: 1 %
Eosinophils Absolute: 0.1 10*3/uL (ref 0.0–0.5)
Eosinophils Relative: 1 %
HCT: 41.4 % (ref 36.0–46.0)
Hemoglobin: 13.1 g/dL (ref 12.0–15.0)
Immature Granulocytes: 0 %
Lymphocytes Relative: 23 %
Lymphs Abs: 1.7 10*3/uL (ref 0.7–4.0)
MCH: 29.1 pg (ref 26.0–34.0)
MCHC: 31.6 g/dL (ref 30.0–36.0)
MCV: 92 fL (ref 80.0–100.0)
Monocytes Absolute: 0.8 10*3/uL (ref 0.1–1.0)
Monocytes Relative: 11 %
Neutro Abs: 4.8 10*3/uL (ref 1.7–7.7)
Neutrophils Relative %: 64 %
Platelets: 172 10*3/uL (ref 150–400)
RBC: 4.5 MIL/uL (ref 3.87–5.11)
RDW: 16.9 % — ABNORMAL HIGH (ref 11.5–15.5)
WBC: 7.5 10*3/uL (ref 4.0–10.5)
nRBC: 0 % (ref 0.0–0.2)

## 2023-01-09 LAB — BRAIN NATRIURETIC PEPTIDE: B Natriuretic Peptide: >4500 pg/mL — ABNORMAL HIGH (ref 0.0–100.0)

## 2023-01-09 LAB — TROPONIN I (HIGH SENSITIVITY)
Troponin I (High Sensitivity): 160 ng/L (ref <18)
Troponin I (High Sensitivity): 145 ng/L (ref <18)

## 2023-01-09 MED ORDER — HYDRALAZINE HCL 20 MG/ML IJ SOLN: 5.0000 mg | Freq: Four times a day (QID) | INTRAMUSCULAR | Status: DC | PRN

## 2023-01-09 MED ORDER — FUROSEMIDE 10 MG/ML IJ SOLN
40.0000 mg | Freq: Once | INTRAMUSCULAR | Status: AC
  Administered 2023-01-09: 40 mg via INTRAVENOUS
  Filled 2023-01-09: qty 4

## 2023-01-09 MED ORDER — SODIUM CHLORIDE 0.9% FLUSH: 3.0000 mL | INTRAVENOUS | Status: DC | PRN

## 2023-01-09 MED ORDER — ATORVASTATIN CALCIUM 80 MG PO TABS
80.0000 mg | ORAL_TABLET | Freq: Every day | ORAL | Status: DC
  Administered 2023-01-10 – 2023-01-12 (×3): 80 mg via ORAL
  Filled 2023-01-09: qty 1
  Filled 2023-01-09: qty 4
  Filled 2023-01-09 (×2): qty 1

## 2023-01-09 MED ORDER — MORPHINE SULFATE (PF) 4 MG/ML IV SOLN
4.0000 mg | Freq: Once | INTRAVENOUS | Status: AC
  Administered 2023-01-09: 4 mg via INTRAVENOUS
  Filled 2023-01-09: qty 1

## 2023-01-09 MED ORDER — APIXABAN 2.5 MG PO TABS
2.5000 mg | ORAL_TABLET | Freq: Two times a day (BID) | ORAL | Status: DC
  Administered 2023-01-09 – 2023-01-12 (×6): 2.5 mg via ORAL
  Filled 2023-01-09 (×6): qty 1

## 2023-01-09 MED ORDER — SODIUM CHLORIDE 0.9% FLUSH
3.0000 mL | Freq: Two times a day (BID) | INTRAVENOUS | Status: DC
  Administered 2023-01-10 – 2023-01-12 (×4): 3 mL via INTRAVENOUS

## 2023-01-09 MED ORDER — METOPROLOL SUCCINATE ER 25 MG PO TB24
12.5000 mg | ORAL_TABLET | Freq: Every day | ORAL | Status: DC
  Administered 2023-01-09 – 2023-01-10 (×2): 12.5 mg via ORAL
  Filled 2023-01-09 (×2): qty 1

## 2023-01-09 MED ORDER — PANTOPRAZOLE SODIUM 40 MG PO TBEC
40.0000 mg | DELAYED_RELEASE_TABLET | Freq: Every day | ORAL | Status: DC
  Administered 2023-01-10 – 2023-01-12 (×3): 40 mg via ORAL
  Filled 2023-01-09 (×3): qty 1

## 2023-01-09 MED ORDER — NICOTINE 14 MG/24HR TD PT24
14.0000 mg | MEDICATED_PATCH | Freq: Every day | TRANSDERMAL | Status: DC
  Administered 2023-01-10 – 2023-01-12 (×3): 14 mg via TRANSDERMAL
  Filled 2023-01-09 (×3): qty 1

## 2023-01-09 MED ORDER — HYDROMORPHONE HCL 1 MG/ML IJ SOLN
0.5000 mg | INTRAMUSCULAR | Status: DC | PRN
  Administered 2023-01-10 – 2023-01-11 (×2): 0.5 mg via INTRAVENOUS
  Filled 2023-01-09 (×2): qty 0.5

## 2023-01-09 MED ORDER — ACETAMINOPHEN 325 MG PO TABS: 650.0000 mg | ORAL_TABLET | ORAL | Status: DC | PRN

## 2023-01-09 MED ORDER — MORPHINE SULFATE (PF) 2 MG/ML IV SOLN: 2.0000 mg | Freq: Once | INTRAVENOUS | Status: DC

## 2023-01-09 MED ORDER — ONDANSETRON HCL 4 MG/2ML IJ SOLN
4.0000 mg | Freq: Four times a day (QID) | INTRAMUSCULAR | Status: DC | PRN
  Administered 2023-01-10: 4 mg via INTRAVENOUS
  Filled 2023-01-09: qty 2

## 2023-01-09 MED ORDER — FUROSEMIDE 10 MG/ML IJ SOLN: 40.0000 mg | Freq: Two times a day (BID) | INTRAMUSCULAR | Status: DC

## 2023-01-09 MED ORDER — SODIUM CHLORIDE 0.9 % IV SOLN: 250.0000 mL | INTRAVENOUS | Status: DC | PRN

## 2023-01-09 MED ORDER — ONDANSETRON HCL 4 MG/2ML IJ SOLN
4.0000 mg | Freq: Once | INTRAMUSCULAR | Status: AC
  Administered 2023-01-09: 4 mg via INTRAVENOUS
  Filled 2023-01-09: qty 2

## 2023-01-09 NOTE — ED Triage Notes (Signed)
Pt to ED from home via [facility] for hip pain. Pt was discharged from this facility on 9/19. PT fell at home on 9/19 and has been bed bound since. Pt is A&O x2. Before hospital stay pt was ambulatory at home. Pt has DNR form but the hospital did not send the hard copy home with pt.   EMS vitals  BP 142/88 HR 88 Temp 99.1 CBG 100

## 2023-01-09 NOTE — ED Notes (Signed)
Pt turned to right side, pillows between knees, head of bed 30 degrees, call bell at side. Pt denies other needs, pt declines offer for toileting and po fluids. Cardiac leads removed from under pressure points of tlso brace. Pt placed on oxygen at 2lpm via [place] for sat of 92%, rebound after 5 min on oxygen to mid 90s.

## 2023-01-09 NOTE — TOC Initial Note (Addendum)
Transition of Care [facility]) - Initial/Assessment Note    Patient Details  Name: Ashley Lynch MRN: [id number] Date of Birth: Sep 14, 1945  Transition of [facility]) CM/SW Contact:    Marquita Palms, LCSW Phone Number: 01/09/2023, 4:25 PM  Clinical Narrative:                  Health risk Assessment complete. CSW met with patient and patient's daughter at bedside. Patient's daughter received resources for [facility]. Patient's daughter reported that she spoke with her mother who is now agreeable to Home Hospice and Home Health care. Patient's daughter will review the list and let TOC know which company she would like to go with. Patient's daughter reports her daughter will pick patient up if she can ride comfortable home. CSW reviewed past notes from previous ED visit and patient and her family declined Home Health and Home hospice at previous discharge.         Patient Goals and CMS Choice            Expected Discharge Plan and Services                                              Prior Living Arrangements/Services                       Activities of Daily Living      Permission Sought/Granted                  Emotional Assessment              Admission diagnosis:  Hip Pain Patient Active Problem List   Diagnosis Date Noted   A-fib (HCC) 12/30/2022   CHF (congestive heart failure) (HCC) 12/30/2022   CHF exacerbation (HCC) 12/01/2022   Acute systolic (congestive) heart failure (HCC) 12/01/2022   Pressure injury of skin 12/20/2021   Acute on chronic systolic CHF (congestive heart failure) (HCC) 12/19/2021   Ischemic cardiomyopathy 12/19/2021   HFrEF (heart failure with reduced ejection fraction) (HCC) 04/23/2021   Coronary artery disease 01/13/2020   Acute renal failure superimposed on stage 3b chronic kidney disease (HCC) 01/12/2020   Anemia 01/12/2020   Atherosclerosis of native arteries of extremity with intermittent  claudication (HCC) 11/15/2018   Carotid stenosis 09/18/2017   TIA (transient ischemic attack) 06/28/2017   Chronic low back pain with left-sided sciatica 10/24/2016   PVD (peripheral vascular disease) (HCC) 05/01/2016   Right carotid bruit 05/01/2016   Pedal edema 05/01/2016   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HCC) 01/12/2016   Chest pain 11/01/2015   Health maintenance examination 05/27/2015   Depression 05/27/2015   Colon cancer screening 05/27/2015   Decreased hearing of left ear 05/27/2015   Left leg swelling 05/27/2015   Neuropathic pain, leg 01/01/2015   Left leg pain 01/01/2015   Essential hypertension 12/23/2014   Heartburn 11/18/2014   Hypertension 11/18/2014   Urinary frequency 11/18/2014   Nicotine dependence 11/18/2014   History of lymphoma 08/17/2014   PCP:  Margaretann Loveless, MD Pharmacy:   [facility] [phone] - [place], [place] - [address] [place] [address] Phone: [phone] Fax: [phone]  [facility] - [place], [place] - [address] [place] [address] Phone: [phone] Fax: [phone]  [id number] [place], [place] - [address] AT [facility] OF [address] [place] [address] Phone: [phone] Fax: [phone]     Social Determinants of Health (SDOH) Social History: SDOH Screenings   Food Insecurity: No Food Insecurity (01/03/2023)  Housing: Patient Declined (01/03/2023)  Transportation Needs: No Transportation Needs (01/03/2023)  Utilities: Not At Risk (01/03/2023)  Financial Resource Strain: Low Risk  (04/24/2021)   Received from [facility], [facility]  Tobacco Use: High Risk (12/30/2022)   SDOH Interventions:     Readmission Risk Interventions    01/03/2023    3:30 PM  Readmission Risk Prevention Plan  Transportation Screening Complete  PCP or Specialist Appt within 3-5 Days Complete  HRI or Home Care Consult Complete   Social Work Consult for Recovery Care Planning/Counseling Complete  Palliative Care Screening Not Complete  Medication Review Ocean[employer]) Complete

## 2023-01-09 NOTE — ED Notes (Signed)
Ed MD at bedside.

## 2023-01-09 NOTE — H&P (Addendum)
History and Physical    Ashley Lynch MWN:[id number] DOB: 09-Jul-1945 DOA: 01/09/2023  PCP: Margaretann Loveless, MD (Confirm with patient/family/NH records and if not entered, this has to be entered at [facility] point of entry) Patient coming from: Home  I have personally briefly reviewed patient's old medical records in [facility]  Chief Complaint: Worsening of back pain and increasing shortness of breath  HPI: Ashley Lynch is a 77 y.o. 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  Patient was originally hospitalized for CHF decompensation, patient was diuresed and symptoms improved and discharged home.  Patient however had very unsteady gait at home and fell several times upon arriving home than started to have severe bilateral hip pain and became more bedbound for the last 3 to 4 days.  And increasingly she has been having more shortness of breath.  Denies any chest pain but rather complains severe back pain and bilateral hip pain. ED Course: Afebrile, blood pressure significantly elevated.  Chest x-ray showed bilateral pulmonary congestion.  CT lumbar spine showed subacute T11 compression fracture.  Otherwise CT pelvic and CT head and neck negative for acute fracture or dislocation.  Blood work showed creatinine 1.4, K3.1 hemoglobin 14, WBC 7.5.  Review of Systems: As per HPI otherwise 14 point review of systems negative.    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)    Myocardial infarction (HCC) 04/23/2021   NSTEMI   PAD (peripheral artery disease) [facility])     Past Surgical History:  Procedure Laterality Date   ABDOMINAL HYSTERECTOMY  04/18/2011   CARDIAC CATHETERIZATION  04/25/2021   CATARACT EXTRACTION W/PHACO Left 09/20/2021   Procedure: CATARACT EXTRACTION PHACO AND INTRAOCULAR LENS PLACEMENT (IOC) LEFT  18.07 01:25.3;  Surgeon: Galen Manila, MD;  Location: [facility];  Service: Ophthalmology;  Laterality: Left;   CATARACT EXTRACTION W/PHACO Right 10/04/2021   Procedure: CATARACT EXTRACTION PHACO AND INTRAOCULAR LENS PLACEMENT (IOC) RIGHT  5.68 00:44.8;  Surgeon: Galen Manila, MD;  Location: [facility];  Service: Ophthalmology;  Laterality: Right;   GALLBLADDER SURGERY  1975 1981   LEFT HEART CATH AND CORONARY ANGIOGRAPHY N/A 01/13/2020   Procedure: LEFT HEART CATH AND CORONARY ANGIOGRAPHY;  Surgeon: Laurier Nancy, MD;  Location: [facility] INVASIVE CV LAB;  Service: Cardiovascular;  Laterality: N/A;   TONSILLECTOMY  04/17/2008     reports that she has been smoking cigarettes. She has a 60 pack-year smoking history. She has never used smokeless tobacco. She reports that she does not drink alcohol and does not use drugs.  No Known Allergies  Family History  Problem Relation Age of Onset   Diabetes Mother        sibling and grandparent also     Prior to Admission medications   Medication Sig Start Date End Date Taking?  Authorizing Provider  apixaban (ELIQUIS) 2.5 MG TABS tablet Take 1 tablet (2.5 mg total) by mouth 2 (two) times daily. 01/04/23 02/03/23  Tresa Moore, MD  aspirin 81 MG tablet Take 81 mg by mouth daily. Patient not taking: Reported on 09/19/2021    [provider]  atorvastatin (LIPITOR) 80 MG tablet Take 1 tablet (80 mg total) by mouth daily. 01/04/23 02/03/23  Tresa Moore, MD  furosemide (LASIX) 40 MG tablet Take 1 tablet  (40 mg total) by mouth daily. 01/05/23 02/04/23  Sreenath, Jonelle Sports, MD  GEMTESA 75 MG TABS Take 75 mg by mouth daily. Patient not taking: Reported on 12/30/2022 09/20/21   [provider]  metoprolol succinate (TOPROL-XL) 25 MG 24 hr tablet Take 0.5 tablets (12.5 mg total) by mouth daily. 01/05/23 02/04/23  Tresa Moore, MD  Multiple Vitamin (MULTIVITAMIN WITH MINERALS) TABS tablet Take 1 tablet by mouth daily. Patient not taking: Reported on 12/30/2022 12/22/21   Enedina Finner, MD  NICODERM CQ 14 MG/24HR patch Place 1 patch onto the skin daily. 08/24/21   [provider]  nitroGLYCERIN (NITROSTAT) 0.4 MG SL tablet Place 0.4 tablets under the tongue daily as needed. Patient not taking: Reported on 12/30/2022 08/24/21   [provider]  omeprazole (PRILOSEC) 20 MG capsule Take 1 capsule (20 mg total) by mouth daily. Patient not taking: Reported on 12/30/2022 11/27/16   Smitty Cords, DO    Physical Exam: Vitals:   01/09/23 1348 01/09/23 1601  BP: (!) 166/85 (!) 179/94  Pulse: 73 91  Resp: (!) 28 20  Temp: 97.9 F (36.6 C)   TempSrc: Oral   SpO2: 100% 96%    Constitutional: NAD, calm, comfortable Vitals:   01/09/23 1348 01/09/23 1601  BP: (!) 166/85 (!) 179/94  Pulse: 73 91  Resp: (!) 28 20  Temp: 97.9 F (36.6 C)   TempSrc: Oral   SpO2: 100% 96%   Eyes: PERRL, lids and conjunctivae normal ENMT: Mucous membranes are moist. Posterior pharynx clear of any exudate or lesions.Normal dentition.  Neck: normal, supple, no masses, no thyromegaly Respiratory: clear to auscultation bilaterally, no wheezing, fine crackles on bilateral lower fields, increasing respiratory effort. No accessory muscle use.  Cardiovascular: Regular rate and rhythm, no murmurs / rubs / gallops. No extremity edema. 2+ pedal pulses. No carotid bruits.  Abdomen: no tenderness, no masses palpated. No hepatosplenomegaly. Bowel sounds positive.  Musculoskeletal: no clubbing /  cyanosis. No joint deformity upper and lower extremities. Good ROM, no contractures. Normal muscle tone.  Skin: no rashes, lesions, ulcers. No induration Neurologic: CN 2-12 grossly intact. Sensation intact, DTR normal. Strength 5/5 in all 4.  Psychiatric: Normal judgment and insight. Alert and oriented x 3. Normal mood.     Labs on Admission: I have personally reviewed following labs and imaging studies  CBC: Recent Labs  Lab 01/04/23 0924 01/09/23 1439  WBC 8.9 7.5  NEUTROABS 6.6 4.8  HGB 12.7 13.1  HCT 39.7 41.4  MCV 90.2 92.0  PLT 120* 172   Basic Metabolic Panel: Recent Labs  Lab 01/03/23 0549 01/04/23 0924 01/09/23 1439  NA 142 138 142  K 4.1 3.6 3.1*  CL 110 106 108  CO2 23 22 23   GLUCOSE 88 104* 94  BUN 41* 43* 28*  CREATININE 1.89* 1.78* 1.45*  CALCIUM 8.2* 8.1* 8.1*  MG 2.0  --   --    GFR: Estimated Creatinine Clearance: 26.9 mL/min (A) (by C-G formula based on SCr of  1.45 mg/dL (H)). Liver Function Tests: No results for input(s): "AST", "ALT", "ALKPHOS", "BILITOT", "PROT", "ALBUMIN" in the last 168 hours. No results for input(s): "LIPASE", "AMYLASE" in the last 168 hours. No results for input(s): "AMMONIA" in the last 168 hours. Coagulation Profile: No results for input(s): "INR", "PROTIME" in the last 168 hours. Cardiac Enzymes: No results for input(s): "CKTOTAL", "CKMB", "CKMBINDEX", "TROPONINI" in the last 168 hours. BNP (last 3 results) No results for input(s): "PROBNP" in the last 8760 hours. HbA1C: No results for input(s): "HGBA1C" in the last 72 hours. CBG: No results for input(s): "GLUCAP" in the last 168 hours. Lipid Profile: No results for input(s): "CHOL", "HDL", "LDLCALC", "TRIG", "CHOLHDL", "LDLDIRECT" in the last 72 hours. Thyroid Function Tests: No results for input(s): "TSH", "T4TOTAL", "FREET4", "T3FREE", "THYROIDAB" in the last 72 hours. Anemia Panel: No results for input(s): "VITAMINB12", "FOLATE", "FERRITIN", "TIBC", "IRON",  "RETICCTPCT" in the last 72 hours. Urine analysis:    Component Value Date/Time   COLORURINE YELLOW (A) 01/09/2023 1442   APPEARANCEUR CLEAR (A) 01/09/2023 1442   LABSPEC 1.016 01/09/2023 1442   PHURINE 6.0 01/09/2023 1442   GLUCOSEU NEGATIVE 01/09/2023 1442   HGBUR LARGE (A) 01/09/2023 1442   BILIRUBINUR NEGATIVE 01/09/2023 1442   KETONESUR 5 (A) 01/09/2023 1442   PROTEINUR 30 (A) 01/09/2023 1442   NITRITE NEGATIVE 01/09/2023 1442   LEUKOCYTESUR NEGATIVE 01/09/2023 1442    Radiological Exams on Admission: CT HEAD WO CONTRAST ( )  Result Date: 01/09/2023 CLINICAL DATA:  Head trauma, moderate-severe.  Recent fall. EXAM: CT HEAD WITHOUT CONTRAST TECHNIQUE: Contiguous axial images were obtained from the base of the skull through the vertex without intravenous contrast. RADIATION DOSE REDUCTION: This exam was performed according to the departmental dose-optimization program which includes automated exposure control, adjustment of the mA and/or kV according to patient size and/or use of iterative reconstruction technique. COMPARISON:  CT head 06/29/2017 FINDINGS: Brain: There is no evidence of an acute infarct, intracranial hemorrhage, mass, midline shift, or extra-axial fluid collection. 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 Hypodensities elsewhere in the cerebral white matter bilaterally are nonspecific but compatible with mild chronic small vessel ischemic disease. Mild global cerebral atrophy is within normal limits for age. Vascular: Calcified atherosclerosis at the skull base. No hyperdense vessel. Skull: No acute fracture or suspicious osseous lesion. Sinuses/Orbits: Small volume fluid in the left sphenoid sinus. Small chronic bilateral mastoid effusions. Bilateral cataract extraction. Other: None. IMPRESSION: 1. No evidence of acute intracranial abnormality. 2. Chronic right PCA infarct, new from 2019. Electronically Signed   By: Sebastian Ache M.D.   On: 01/09/2023  17:07   CT Lumbar Spine Wo Contrast  Result Date: 01/09/2023 CLINICAL DATA:  Back trauma, no prior imaging (Age >= 16y). Hip pain. Recent fall with inability to ambulate. EXAM: CT LUMBAR SPINE WITHOUT CONTRAST TECHNIQUE: Multidetector CT imaging of the lumbar spine was performed without intravenous contrast administration. Multiplanar CT image reconstructions were also generated. RADIATION DOSE REDUCTION: This exam was performed according to the departmental dose-optimization program which includes automated exposure control, adjustment of the mA and/or kV according to patient size and/or use of iterative reconstruction technique. COMPARISON:  CT lumbar spine 11/04/2022 FINDINGS: Segmentation: 5 lumbar type vertebrae. Alignment: Unchanged and near anatomic. Vertebrae: T11 superior endplate compression fracture with 30% vertebral body height loss and a visible, of incompletely healed fracture line with surrounding sclerosis suggestive of a subacute time course. Chronic L3 compression fracture with unchanged height loss and 6 mm retropulsion.  Diffuse osteopenia. No suspicious bone lesion. Paraspinal and other soft tissues: Partially visualized pleural effusions, right larger than left. Extensive atherosclerosis of the abdominal aorta and its branch vessels. Disc levels: Unchanged moderate spinal stenosis at L2-3 due to retropulsion of the L3 superior endplate and posterior element hypertrophy. Up to moderate neural foraminal stenosis from L2-3 through L5-S1, also unchanged. IMPRESSION: 1. Subacute appearing T11 compression fracture with 30% height loss. 2. Unchanged chronic L3 compression fracture with retropulsion and moderate spinal stenosis. 3. Partially visualized right larger than left pleural effusions. 4.  Aortic Atherosclerosis (ICD10-I70.0). Electronically Signed   By: Sebastian Ache M.D.   On: 01/09/2023 17:03   CT PELVIS WO CONTRAST  Result Date: 01/09/2023 CLINICAL DATA:  Hip pain after fall for 5  days. EXAM: CT PELVIS WITHOUT CONTRAST TECHNIQUE: Multidetector CT imaging of the pelvis was performed following the standard protocol without intravenous contrast. RADIATION DOSE REDUCTION: This exam was performed according to the departmental dose-optimization program which includes automated exposure control, adjustment of the mA and/or kV according to patient size and/or use of iterative reconstruction technique. COMPARISON:  March 14, 2017. FINDINGS: Urinary Tract:  Urinary bladder is unremarkable. Bowel: Sigmoid diverticulosis is noted with mild wall thickening and possible mild surrounding inflammatory changes. There is no evidence of bowel obstruction. Vascular/Lymphatic: Aortic atherosclerosis.  No adenopathy is noted. Reproductive:  Status post hysterectomy.  No adnexal abnormality. Other:  No ascites or hernia. Musculoskeletal: No definite fracture is noted. IMPRESSION: Sigmoid diverticulosis is noted. There is noted mild wall thickening of sigmoid colon with possible surrounding inflammatory changes suggesting the possibility of diverticulitis or colitis. Aortic Atherosclerosis (ICD10-I70.0). Electronically Signed   By: Lupita Raider M.D.   On: 01/09/2023 16:55   DG Chest Portable 1 View  Result Date: 01/09/2023 CLINICAL DATA:  Shortness of breath and hip pain after fall EXAM: PORTABLE CHEST 1 VIEW COMPARISON:  Chest radiograph dated 12/31/2022 FINDINGS: Partially imaged right neck surgical clips. Low lung volumes. Increased asymmetric right lung opacification. Dense left retrocardiac opacities. Mild bilateral interstitial opacities. Large right pleural effusion. Trace left pleural effusion. No pneumothorax. Right heart border is obscured. No radiographic finding of acute displaced fracture. IMPRESSION: 1. Large right pleural effusion with increased asymmetric right lung opacification and dense left retrocardiac opacities, which may represent atelectasis or infection. 2. Mild bilateral  interstitial opacities, likely pulmonary edema. Electronically Signed   By: Agustin Cree M.D.   On: 01/09/2023 16:54    EKG: Independently reviewed.  A-fib, rate controlled, no acute ST changes.  Assessment/Plan Principal Problem:   CHF (congestive heart failure) (HCC) Active Problems:   Acute on chronic systolic CHF (congestive heart failure) (HCC)  (please populate well all problems here in Problem List. (For example, if patient is on BP meds at home and you resume or decide to hold them, it is a problem that needs to be her. Same for CAD, COPD, HLD and so on)  Acute on chronic HFrEF decompensation -Appears to have uncontrolled hypertension which was new.  Clinically suspect uncontrolled hypertension probably related to uncontrolled back pain which appears to be disabling. -Continue diuresis tomorrow -INO's and BMP daily -Patient agreed with palliative care.  Palliative consulted in ED.  Subacute T11 compression fracture - CT showed subacute T11 compression fracture which likely related to recent fall that happened last week which was confirmed by family.  Patient is not a surgical candidate given her cardiac function and overall conditions.  Will start TLSO, and consult PT/OT. -IV Dilaudid  for now, bridging for p.o. narcotics  HTN, uncontrolled -Probably secondary to poorly controlled back fracture pain -Pain control as above -As needed hydralazine  Chronic A-fib -Rate controlled, continue low-dose metoprolol -Continue Eliquis  CKD stage IV -Volume overloaded, on Lasix, follow-up daily BMP  CAD, PVD -Off Plavix, continue statin  DVT prophylaxis: Eliquis Code Status: Full code Family Communication: Daughter and granddaughter bedside Disposition Plan: Patient is sick with CHF decompensation, and a new thoracic vertebral compression fracture, expect more than 2 midnight hospital stay for IV diuresis Consults called: None Admission status: Telemetry admission   Emeline General  MD [facility] Pager [phone]  01/09/2023, 6:40 PM

## 2023-01-09 NOTE — ED Provider Notes (Signed)
[facility] Provider Note    Event Date/Time   First MD Initiated Contact with Patient 01/09/23 1355     (approximate)   History   Hip Pain   HPI  Ashley Lynch is a 77 y.o. female with extensive past medical history including advanced CHF status post recent admission for CHF, here with fall, weakness, and lower back/tailbone pain.  The patient was just discharged following recent hospitalization.  The day that she returned home, she reportedly was weak and had not ambulated in the hospital.  She attempted to get up at home and fell, falling directly onto her hips and buttocks.  She has since had significant pain in her tailbone, and has been unable to ambulate due to this.  She has been increasingly weak.  She said decreased appetite.  Denies any head injury.  No numbness or weakness.  No other complaints.     Physical Exam   Triage Vital Signs: ED Triage Vitals  Encounter Vitals Group     BP 01/09/23 1348 (!) 166/85     Systolic BP Percentile --      Diastolic BP Percentile --      Pulse Rate 01/09/23 1348 73     Resp 01/09/23 1348 (!) 28     Temp 01/09/23 1348 97.9 F (36.6 C)     Temp Source 01/09/23 1348 Oral     SpO2 01/09/23 1348 100 %     Weight --      Height --      Head Circumference --      Peak Flow --      Pain Score 01/09/23 1341 4     Pain Loc --      Pain Education --      Exclude from Growth Chart --     Most recent vital signs: Vitals:   01/09/23 1830 01/09/23 1900  BP: (!) 151/84 (!) 151/93  Pulse: 80 85  Resp:    Temp:    SpO2: 90% 92%     General: Awake, no distress.  CV:  Good peripheral perfusion.  Regular rate and rhythm.  No murmurs. Resp:  Normal work of breathing.  Lungs clear to auscultation bilaterally. Abd:  No distention.  No tenderness. Other:  Significant midline tenderness to palpation over the midline lumbar and sacral spine.  Mild tenderness bilateral hips.  No obvious deformity.  Slight bruising  noted to the buttocks.   ED Results / Procedures / Treatments   Labs (all labs ordered are listed, but only abnormal results are displayed) Labs Reviewed  CBC WITH DIFFERENTIAL/PLATELET - Abnormal; Notable for the following components:      Result Value   RDW 16.9 (*)    All other components within normal limits  BASIC METABOLIC PANEL - Abnormal; Notable for the following components:   Potassium 3.1 (*)    BUN 28 (*)    Creatinine, Ser 1.45 (*)    Calcium 8.1 (*)    GFR, Estimated 37 (*)    All other components within normal limits  BRAIN NATRIURETIC PEPTIDE - Abnormal; Notable for the following components:   B Natriuretic Peptide >4,500.0 (*)    All other components within normal limits  URINALYSIS, ROUTINE W REFLEX MICROSCOPIC - Abnormal; Notable for the following components:   Color, Urine YELLOW (*)    APPearance CLEAR (*)    Hgb urine dipstick LARGE (*)    Ketones, ur 5 (*)    Protein, ur  30 (*)    Bacteria, UA FEW (*)    All other components within normal limits  TROPONIN I (HIGH SENSITIVITY) - Abnormal; Notable for the following components:   Troponin I (High Sensitivity) 160 (*)    All other components within normal limits  TROPONIN I (HIGH SENSITIVITY) - Abnormal; Notable for the following components:   Troponin I (High Sensitivity) 145 (*)    All other components within normal limits  BASIC METABOLIC PANEL  VITAMIN D 25 HYDROXY (VIT D DEFICIENCY, FRACTURES)     EKG Normal sinus rhythm with pACs, VR 86. PR 152, QRS 110, QTc 469.   RADIOLOGY CT Head: NAICA CT Spine: Pending CXR: Bilateral edema, large R effusion   I also independently reviewed and agree with radiologist interpretations.   PROCEDURES:  Critical Care performed: No   MEDICATIONS ORDERED IN ED: Medications  atorvastatin (LIPITOR) tablet 80 mg (has no administration in time range)  metoprolol succinate (TOPROL-XL) 24 hr tablet 12.5 mg (has no administration in time range)  nicotine  (NICODERM CQ - dosed in mg/24 hours) patch 14 mg (has no administration in time range)  pantoprazole (PROTONIX) EC tablet 40 mg (has no administration in time range)  apixaban (ELIQUIS) tablet 2.5 mg (has no administration in time range)  sodium chloride flush (NS) 0.9 % injection 3 mL (has no administration in time range)  sodium chloride flush (NS) 0.9 % injection 3 mL (has no administration in time range)  0.9 %  sodium chloride infusion (has no administration in time range)  acetaminophen (TYLENOL) tablet 650 mg (has no administration in time range)  ondansetron (ZOFRAN) injection 4 mg (has no administration in time range)  furosemide (LASIX) injection 40 mg (has no administration in time range)  HYDROmorphone (DILAUDID) injection 0.5 mg (has no administration in time range)  hydrALAZINE (APRESOLINE) injection 5 mg (has no administration in time range)  ondansetron (ZOFRAN) injection 4 mg (4 mg Intravenous Given 01/09/23 1454)  morphine (PF) 4 MG/ML injection 4 mg (4 mg Intravenous Given 01/09/23 1454)  furosemide (LASIX) injection 40 mg (40 mg Intravenous Given 01/09/23 1611)     IMPRESSION / MDM / ASSESSMENT AND PLAN / ED COURSE  I reviewed the triage vital signs and the nursing notes.                              Differential diagnosis includes, but is not limited to, fall with lumbar fx, pelvic injury, lumbar DDD, generalized deconditioning and weakness, dehydration, CHF, ACS  Patient's presentation is most consistent with acute presentation with potential threat to life or bodily function.  The patient is on the cardiac monitor to evaluate for evidence of arrhythmia and/or significant heart rate changes  77 yo F here with pain after fall and generalized weakness. Re: fall - CT imaging is pending. No signs of neurological compromise bl LE. Denies overt hip pain, more so localized along her spine/sacrum. Pt also appears to be in mild CHF, with tachypnea, SOB, and b/l rales. CXR shows  worsening edema. Trop 160 and BNP>4500. Suspect possible compression fx, fall, and acute on chronic CHF. Pt would benefit from Hospice consultation, and she and family are amenable to this. Will admit for sx control and palliative c/s.    FINAL CLINICAL IMPRESSION(S) / ED DIAGNOSES   Final diagnoses:  Fall, initial encounter  Acute on chronic systolic congestive heart failure (HCC)     Rx / DC Orders  ED Discharge Orders     None        Note:  This document was prepared using Dragon voice recognition software and may include unintentional dictation errors.   Shaune Pollack, MD 01/09/23 2017

## 2023-01-09 NOTE — ED Notes (Signed)
TLSO brace applied by ortho--pt also wet and was cleaned with new brief placed on

## 2023-01-09 NOTE — ED Notes (Signed)
Pt transferred to bed in cpod. Clean brief placed on pt and pt hooked up to bedside monitor.

## 2023-01-09 NOTE — ED Notes (Signed)
Called for a TLSO  BRACE  spoke with [place]

## 2023-01-09 NOTE — Progress Notes (Signed)
Orthopedic Tech Progress Note Patient Details:  Ashley Lynch [birth year] [id number]  Patient ID: [name], female   DOB: 1945/11/16, 77 y.o.   MRN: [id number] TLSO ordered from [facility]. Darleen Crocker 01/09/2023, 8:28 PM

## 2023-01-09 NOTE — ED Notes (Signed)
Asked secretary to call about TLSO brace ordered.

## 2023-01-09 NOTE — Consult Note (Signed)
Consultation Note Date: 01/09/2023 at 1515  Patient Name: Ashley Lynch  DOB: 1946/01/07  MRN: [id number]  Age / Sex: 77 y.o., female  PCP: Ashley Loveless, MD Referring Physician: Shaune Pollack, MD  HPI/Patient Profile: 77 y.o. female  with past medical history of multivessel CAD s/p PCI and angioplasty, ischemic cardiomyopathy, chronic HFrEF with EF 25-30%, CKD stage 3b, PVD, HLD, and recent hospitalization for CHF exacerbation (9/14 - 9/19) admitted on 01/09/2023 with fall at home with low back/tailbone pain.  Patient is familiar to our service as we followed her during her previous admission.  PMT was consulted to discuss goals of care..   Clinical Assessment and Goals of Care: Extensive chart review completed prior to meeting patient including labs, vital signs, imaging, progress notes, orders, and available advanced directive documents from current and previous encounters. I then met with patient and her daughter Ashley Lynch to discuss diagnosis prognosis, GOC, EOL wishes, disposition and options.  I introduced Palliative Medicine as specialized medical care for people living with serious illness. It focuses on providing relief from the symptoms and stress of a serious illness. The goal is to improve quality of life for both the patient and the family.  As far as functional status, Ashley Lynch shares patient did not do well at home after discharge last week.  She said within 10 minutes of being at home, patient fell to the floor.  The next day the family acquired a hospital bed and the patient has been in the hospital bed ever since.  Ashley Lynch shares that patient stated she did not feel well yesterday and was subsequently brought here for pain control.  As per chart review, previous discussions with PMT revealed that patient's wishes to be at home and have medications controlling her symptoms were in line with hospice  services.  However, patient was adamant that she did not want to receive "hospice" services at home.  Ashley Lynch and I discussed patient's goals and what is important to patient and family at this time.  Ashley Lynch believes that her mother would like to be able to stay at home as long as possible.  She shares her mother does not like coming to the doctor at the hospital.  However, she recognizes that something must have been really wrong for her mother to ask for medical treatment last night, thus sparking the admission to the ED today.  Ashley Lynch confirmed patient is DNR with limited interventions.  No aggressive measures to be taken.  Patient would never want to be intubated.  Ashley Lynch believes her mother would like to have her pain managed and for her symptoms from CHF and other chronic illnesses managed at home with is much independence as possible.    In light of Ashley Lynch's comments of having patient's symptoms managed at home, we briefly discussed hospice philosophy and hospice services.  Ashley Lynch shares that she believes this is in line with what the patient would want.  Reviewed quality of life and aging in place.   Ashley Lynch shares patient has had  great difficulty urinating and has requested a pure wick.  Discussed that patient can have a pure wick while hospitalized.  Additionally, and thinking about transitioning back to home and avoiding future falls/conserving her energy, patient could have a Foley catheter placed if comfort measures are the focus.  Comfort measures briefly discussed.    Again Ashley Lynch shares that she believes focusing on comfort would be in line with what her mother would want.  She conveys she knows her mother holds a large stigma against agreeing to hospice care.    I would attempt to speak with patient in regards to hospice services and plan of care.  However, patient has just received morphine and is unable to participate in complex medical decision making and goals of care discussions at  this time.  At this time, EKG is being performed, as well as CT scan results and further workup pending.  Patient to be admitted.  Questions and concerns were addressed.  Ashley Lynch and I agreed to meet again tomorrow 9/25 to continue goals of care discussions.    PMT will continue to follow and support patient and family throughout her hospitalization.   Primary Decision Maker PATIENT  Physical Exam Vitals reviewed.  Constitutional:      General: She is not in acute distress.    Comments: Thin, frail  HENT:     Mouth/Throat:     Mouth: Mucous membranes are moist.  Pulmonary:     Effort: Pulmonary effort is normal.  Abdominal:     Palpations: Abdomen is soft.  Musculoskeletal:     Comments: Generalize weakness  Skin:    General: Skin is warm and dry.     Coloration: Skin is pale.     Palliative Assessment/Data: 40%     Thank you for this consult. Palliative medicine will continue to follow and assist holistically.   Time Total: 75 minutes  Time spent includes: Detailed review of medical records (labs, imaging, vital signs), medically appropriate exam (mental status, respiratory, cardiac, skin), discussed with treatment team, counseling and educating patient, family and staff, documenting clinical information, medication management and coordination of care.  Signed by: Georgiann Cocker, DNP, FNP-BC Palliative Medicine   Please contact Palliative Medicine Team providers via [facility] for questions and concerns.

## 2023-01-10 ENCOUNTER — Inpatient Hospital Stay: Payer: Medicare PPO

## 2023-01-10 DIAGNOSIS — S22080A Wedge compression fracture of T11-T12 vertebra, initial encounter for closed fracture: Secondary | ICD-10-CM

## 2023-01-10 DIAGNOSIS — W19XXXA Unspecified fall, initial encounter: Secondary | ICD-10-CM

## 2023-01-10 DIAGNOSIS — I5023 Acute on chronic systolic (congestive) heart failure: Secondary | ICD-10-CM | POA: Diagnosis not present

## 2023-01-10 DIAGNOSIS — Z515 Encounter for palliative care: Secondary | ICD-10-CM | POA: Diagnosis not present

## 2023-01-10 LAB — HEPATIC FUNCTION PANEL
ALT: 15 U/L (ref 0–44)
AST: 26 U/L (ref 15–41)
Albumin: 2.4 g/dL — ABNORMAL LOW (ref 3.5–5.0)
Alkaline Phosphatase: 114 U/L (ref 38–126)
Bilirubin, Direct: 0.2 mg/dL (ref 0.0–0.2)
Indirect Bilirubin: 0.3 mg/dL (ref 0.3–0.9)
Total Bilirubin: 0.5 mg/dL (ref 0.3–1.2)
Total Protein: 5.6 g/dL — ABNORMAL LOW (ref 6.5–8.1)

## 2023-01-10 LAB — BASIC METABOLIC PANEL
Anion gap: 10 (ref 5–15)
BUN: 27 mg/dL — ABNORMAL HIGH (ref 8–23)
CO2: 26 mmol/L (ref 22–32)
Calcium: 8 mg/dL — ABNORMAL LOW (ref 8.9–10.3)
Chloride: 108 mmol/L (ref 98–111)
Creatinine, Ser: 1.61 mg/dL — ABNORMAL HIGH (ref 0.44–1.00)
GFR, Estimated: 33 mL/min — ABNORMAL LOW (ref >60)
Glucose, Bld: 97 mg/dL (ref 70–99)
Potassium: 3.1 mmol/L — ABNORMAL LOW (ref 3.5–5.1)
Sodium: 144 mmol/L (ref 135–145)

## 2023-01-10 LAB — BODY FLUID CELL COUNT WITH DIFFERENTIAL
Eos, Fluid: 0 %
Lymphs, Fluid: 16 %
Monocyte-Macrophage-Serous Fluid: 57 %
Neutrophil Count, Fluid: 27 %
Total Nucleated Cell Count, Fluid: 90 cu mm

## 2023-01-10 LAB — PROTEIN, PLEURAL OR PERITONEAL FLUID: Total protein, fluid: <3 g/dL

## 2023-01-10 LAB — LACTATE DEHYDROGENASE, PLEURAL OR PERITONEAL FLUID: LD, Fluid: 46 U/L — ABNORMAL HIGH (ref 3–23)

## 2023-01-10 LAB — LACTATE DEHYDROGENASE: LDH: 238 U/L — ABNORMAL HIGH (ref 98–192)

## 2023-01-10 LAB — MAGNESIUM: Magnesium: 2.2 mg/dL (ref 1.7–2.4)

## 2023-01-10 LAB — PATHOLOGIST SMEAR REVIEW

## 2023-01-10 LAB — PHOSPHORUS: Phosphorus: 3.7 mg/dL (ref 2.5–4.6)

## 2023-01-10 MED ORDER — POTASSIUM CHLORIDE 20 MEQ PO PACK
40.0000 meq | PACK | Freq: Once | ORAL | Status: AC
  Administered 2023-01-10: 40 meq via ORAL
  Filled 2023-01-10: qty 2

## 2023-01-10 MED ORDER — OXYCODONE HCL 5 MG PO TABS
5.0000 mg | ORAL_TABLET | Freq: Four times a day (QID) | ORAL | Status: DC | PRN
  Administered 2023-01-10 – 2023-01-12 (×4): 5 mg via ORAL
  Filled 2023-01-10 (×4): qty 1

## 2023-01-10 MED ORDER — LIDOCAINE HCL (PF) 1 % IJ SOLN
10.0000 mL | Freq: Once | INTRAMUSCULAR | Status: DC
  Filled 2023-01-10: qty 10

## 2023-01-10 MED ORDER — OXYCODONE HCL 5 MG PO TABS: 5.0000 mg | ORAL_TABLET | Freq: Four times a day (QID) | ORAL | Status: DC | PRN

## 2023-01-10 MED ORDER — ACETAMINOPHEN 325 MG PO TABS
650.0000 mg | ORAL_TABLET | Freq: Four times a day (QID) | ORAL | Status: DC
  Administered 2023-01-10 – 2023-01-12 (×8): 650 mg via ORAL
  Filled 2023-01-10 (×6): qty 2

## 2023-01-10 NOTE — ED Notes (Signed)
Pt cleansed of incontinent urine, new sacral dressing applied.

## 2023-01-10 NOTE — Procedures (Signed)
Interventional Radiology Procedure Note  Procedure: Image guided right thoracentesis. ~1L removed  Complications: None  EBL: None Sample: labs sent  Recommendations: - follow up CXR - routine wound care  Signed,  Yvone Neu. Loreta Ave, DO, ABVM, RPVI

## 2023-01-10 NOTE — ED Notes (Signed)
Pt tuned to her back, pillow used to float heels.

## 2023-01-10 NOTE — Evaluation (Signed)
Physical Therapy Evaluation Patient Details Name: Ashley Lynch MRN: [id number] DOB: [birth year] Today's Date: 01/10/2023  History of Present Illness  Pt is a 77 y.o. 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  Clinical Impression  Pt is seen by OT and PT for a co-evaluation due to complexity of Pt's status. Pt is received at bedside with daughter and grand daughter at bedside, she is agreeable to PT session. Pt is oriented to self and family but unable to answer place, situation, or month. At baseline, Pt's family reports she is very "sharp" with cognition, able to amb short distances (household) without AD, sink bathes, and requires assistance with ADLs/IADLs. At this time, Pt is limited to bed mobility and transfers min A x2 due to generalized weakness. Pt would benefit from skilled PT to address above deficits and promote optimal return to PLOF.        If plan is discharge home, recommend the following: A little help with walking and/or transfers;A little help with bathing/dressing/bathroom;Assistance with cooking/housework;Supervision due to cognitive status;Help with stairs or ramp for entrance   Can travel by private vehicle   No    Equipment Recommendations Other (comment) (TBD at this time)  Recommendations for Other Services       Functional Status Assessment Patient has had a recent decline in their functional status and demonstrates the ability to make significant improvements in function in a reasonable and predictable amount of time.     Precautions / Restrictions Precautions Precautions: Fall;Back Required Braces or Orthoses: Spinal Brace Spinal Brace: Thoracolumbosacral orthotic Restrictions Weight Bearing Restrictions: No Other  Position/Activity Restrictions: TLSO to be donned EOB      Mobility  Bed Mobility Overal bed mobility: Needs Assistance Bed Mobility: Rolling, Sidelying to Sit Rolling: Min assist, +2 for physical assistance, Used rails Sidelying to sit: Min assist, +2 for physical assistance, HOB elevated       General bed mobility comments: assistance with BLE management and trunk support to sit upright    Transfers Overall transfer level: Needs assistance Equipment used: 2 person hand held assist Transfers: Sit to/from Stand Sit to Stand: Min assist, +2 physical assistance           General transfer comment: required min A x2 to increase stability and constant cuing to promote upright posture    Ambulation/Gait               General Gait Details: deferred at this time due to safety concerns and increased fatigue  Stairs            Wheelchair Mobility     Tilt Bed    Modified Rankin (Stroke Patients Only)       Balance Overall balance assessment: Needs assistance Sitting-balance support: Feet supported Sitting balance-Leahy Scale: Fair Sitting balance - Comments: able to sit EOB but required CGA for safety   Standing balance support: Bilateral upper extremity supported, During functional activity, Reliant on assistive device for balance Standing balance-Leahy Scale: Fair Standing balance comment: able to maintain static standing balance but require BUE support due to generalized weakness                             Pertinent Vitals/Pain Pain Assessment Pain Assessment:  Faces Faces Pain Scale: Hurts little more Pain Location: generalized; BLE sensitivity Pain Descriptors / Indicators: Sore, Discomfort Pain Intervention(s): Monitored during session, Limited activity within patient's tolerance, Premedicated before session, Repositioned    Home Living Family/patient expects to be discharged to:: Private residence Living Arrangements: Other  relatives (granddaughter) Available Help at Discharge: Family;Available 24 hours/day Type of Home: Mobile home Home Access: Ramped entrance (steps also but does not use)       Home Layout: One level Home Equipment: Cane - single point;Rolling Walker (2 wheels);Other (comment) Additional Comments: has L eye lense prism glasses    Prior Function Prior Level of Function : Independent/Modified Independent       Physical Assist : Mobility (physical) Mobility (physical): Transfers;Gait   Mobility Comments: Granddaughter will assist as needed. ADLs Comments: Mod I for sink bath and dressing. Granddaughter assists with IADLs and driving. Pt does not use AD to ambulate at baseline     Extremity/Trunk Assessment   Upper Extremity Assessment Upper Extremity Assessment: Generalized weakness    Lower Extremity Assessment Lower Extremity Assessment: Generalized weakness       Communication   Communication Communication: Difficulty following commands/understanding Following commands: Follows one step commands inconsistently;Follows one step commands with increased time Cueing Techniques: Verbal cues;Tactile cues  Cognition Arousal: Lethargic Behavior During Therapy: Flat affect Overall Cognitive Status: History of cognitive impairments - at baseline Area of Impairment: Attention, Memory, Following commands, Safety/judgement, Awareness, Problem solving                   Current Attention Level: Sustained Memory: Decreased short-term memory Following Commands: Follows one step commands with increased time Safety/Judgement: Decreased awareness of deficits Awareness: Emergent Problem Solving: Slow processing, Requires verbal cues, Requires tactile cues General Comments: min responses to questions; better at answering yes/no questions        General Comments General comments (skin integrity, edema, etc.): slight redness on low back region secondary to being in bed and  generalized itching which Pt's daughter reports is a reaction from morphine    Exercises     Assessment/Plan    PT Assessment Patient needs continued PT services  PT Problem List Decreased strength;Decreased activity tolerance;Decreased balance;Decreased mobility;Decreased safety awareness       PT Treatment Interventions Gait training;DME instruction;Stair training;Functional mobility training;Therapeutic exercise;Therapeutic activities;Balance training;Neuromuscular re-education;Cognitive remediation;Patient/family education;Wheelchair mobility training    PT Goals (Current goals can be found in the Care Plan section)  Acute Rehab PT Goals Patient Stated Goal: unable to state at this time PT Goal Formulation: With patient Time For Goal Achievement: 01/24/23 Potential to Achieve Goals: Fair    Frequency Min 1X/week     Co-evaluation PT/OT/SLP Co-Evaluation/Treatment: Yes Reason for Co-Treatment: To address functional/ADL transfers;Necessary to address cognition/behavior during functional activity PT goals addressed during session: Mobility/safety with mobility;Balance OT goals addressed during session: ADL's and self-care       AM-PAC PT "6 Clicks" Mobility  Outcome Measure Help needed turning from your back to your side while in a flat bed without using bedrails?: A Little Help needed moving from lying on your back to sitting on the side of a flat bed without using bedrails?: A Little Help needed moving to and from a bed to a chair (including a wheelchair)?: A Lot Help needed standing up from a chair using your arms (e.g., wheelchair or bedside chair)?: A Lot Help needed to walk in hospital room?: A Lot Help needed climbing 3-5 steps with a railing? : A Lot  6 Click Score: 14    End of Session   Activity Tolerance: Patient limited by fatigue Patient left: in bed;with call bell/phone within reach;with bed alarm set;with family/visitor present Nurse Communication:  Mobility status PT Visit Diagnosis: Unsteadiness on feet (R26.81);Muscle weakness (generalized) (M62.81)    Time: 7829-5621 PT Time Calculation (min) (ACUTE ONLY): 18 min   Charges:   PT Evaluation $PT Eval Moderate Complexity: 1 Mod   PT General Charges $$ ACUTE PT VISIT: 1 Visit         Elmon Else, SPT   Cortny Bambach 01/10/2023, 3:54 PM

## 2023-01-10 NOTE — ED Notes (Signed)
Pt to left side, pillow placed between knees.

## 2023-01-10 NOTE — Evaluation (Signed)
Occupational Therapy Evaluation Patient Details Name: Ashley Lynch MRN: [id number] DOB: 10/11/1945 Today's Date: 01/10/2023   History of Present Illness Pt is a 77 y.o. 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   Clinical Impression   Patient presenting with decreased Ind in self care,balance, functional mobility/transfers, endurance, cognition, and safety awareness. Patient lethargic during session and family present to confirm baseline. Pt lives at home with granddaughter who is available almost 24/7. Pt does not use AD for mobility. She is Ind with self care and family assists with IADLs. Patient currently needing min A of 2 for log roll to sit on EOB. Min A of 2 in order to stand from standard bed height. She was unable to take any steps this session. Patient will benefit from acute OT to increase overall independence in the areas of ADLs, functional mobility, and safety awareness in order to safely discharge.       If plan is discharge home, recommend the following: A lot of help with bathing/dressing/bathroom;Assistance with cooking/housework;Help with stairs or ramp for entrance;A lot of help with walking and/or transfers;Assist for transportation;Direct supervision/assist for medications management;Direct supervision/assist for financial management;Supervision due to cognitive status    Functional Status Assessment  Patient has had a recent decline in their functional status and demonstrates the ability to make significant improvements in function in a reasonable and predictable amount of time.  Equipment Recommendations  Other (comment) (defer to next venue of care)       Precautions / Restrictions Precautions Precautions: Fall;Back Required Braces  or Orthoses: Spinal Brace Spinal Brace: Thoracolumbosacral orthotic Restrictions Weight Bearing Restrictions: No Other Position/Activity Restrictions: TLSO to be donned EOB      Mobility Bed Mobility Overal bed mobility: Needs Assistance Bed Mobility: Rolling, Sidelying to Sit, Sit to Sidelying Rolling: Min assist, +2 for physical assistance, Used rails Sidelying to sit: Min assist, +2 for physical assistance, HOB elevated     Sit to sidelying: Min assist, +2 for physical assistance      Transfers Overall transfer level: Needs assistance Equipment used: 2 person hand held assist Transfers: Sit to/from Stand Sit to Stand: Min assist, +2 physical assistance                  Balance Overall balance assessment: Needs assistance Sitting-balance support: Feet supported Sitting balance-Leahy Scale: Fair     Standing balance support: Bilateral upper extremity supported Standing balance-Leahy Scale: Poor                             ADL either performed or assessed with clinical judgement   ADL Overall ADL's : Needs assistance/impaired                         Toilet Transfer: Moderate assistance;Stand-pivot;Squat-pivot Toilet Transfer Details (indicate cue type and reason): simulated with HHA                 Vision Baseline Vision/History: 1 Wears glasses Patient Visual Report: No change from baseline Additional Comments: L prism lens in glasses that family reports is from prior eye surgery            Pertinent Vitals/Pain Pain Assessment Pain Assessment: Faces Faces Pain  Scale: Hurts little more Pain Location: IV in L UE and B LEs Pain Descriptors / Indicators: Discomfort, Grimacing Pain Intervention(s): Limited activity within patient's tolerance, Monitored during session, Premedicated before session, Repositioned     Extremity/Trunk Assessment Upper Extremity Assessment Upper Extremity Assessment: Generalized weakness   Lower  Extremity Assessment Lower Extremity Assessment: Generalized weakness       Communication Communication Communication: Difficulty following commands/understanding Following commands: Follows one step commands inconsistently;Follows one step commands with increased time Cueing Techniques: Verbal cues;Tactile cues   Cognition Arousal: Lethargic Behavior During Therapy: Flat affect Overall Cognitive Status: History of cognitive impairments - at baseline                                 General Comments: Pt with dementia at baseline. Pt does not answer questions and having difficulty staying awake.     General Comments  slight redness on low back region secondary to being in bed and generalized itching which Pt's daughter reports is a reaction from morphine            Home Living Family/patient expects to be discharged to:: Private residence Living Arrangements: Other relatives (granddaughter) Available Help at Discharge: Family;Available 24 hours/day Type of Home: Mobile home Home Access: Ramped entrance (steps also but does not use)     Home Layout: One level     Bathroom Shower/Tub: Tub/shower unit;Sponge bathes at baseline   Bathroom Toilet: Standard     Home Equipment: Cane - single Librarian, academic (2 wheels);Other (comment)   Additional Comments: has L eye lense prism glasses      Prior Functioning/Environment Prior Level of Function : Independent/Modified Independent       Physical Assist : Mobility (physical) Mobility (physical): Transfers;Gait   Mobility Comments: Granddaughter will assist as needed. ADLs Comments: Mod I for sink bath and dressing. Granddaughter assists with IADLs and driving. Pt does not use AD to ambulate at baseline        OT Problem List: Decreased strength;Decreased activity tolerance;Impaired balance (sitting and/or standing);Decreased safety awareness;Decreased cognition;Decreased knowledge of use of DME or  AE;Decreased knowledge of precautions;Pain      OT Treatment/Interventions: Self-care/ADL training;Balance training;Therapeutic exercise;Therapeutic activities;Energy conservation;DME and/or AE instruction;Patient/family education;Cognitive remediation/compensation    OT Goals(Current goals can be found in the care plan section) Acute Rehab OT Goals Patient Stated Goal: to decrease pain and return to PLOF OT Goal Formulation: With patient/family Time For Goal Achievement: 01/24/23 Potential to Achieve Goals: Fair ADL Goals Pt Will Perform Grooming: with supervision;standing Pt Will Perform Lower Body Dressing: with contact guard assist;with adaptive equipment;sit to/from stand Pt Will Transfer to Toilet: with supervision;ambulating Pt Will Perform Toileting - Clothing Manipulation and hygiene: sit to/from stand;with contact guard assist  OT Frequency: Min 1X/week    Co-evaluation PT/OT/SLP Co-Evaluation/Treatment: Yes Reason for Co-Treatment: To address functional/ADL transfers;Necessary to address cognition/behavior during functional activity PT goals addressed during session: Mobility/safety with mobility;Balance OT goals addressed during session: ADL's and self-care      AM-PAC OT "6 Clicks" Daily Activity     Outcome Measure Help from another person eating meals?: None Help from another person taking care of personal grooming?: A Little Help from another person toileting, which includes using toliet, bedpan, or urinal?: A Lot Help from another person bathing (including washing, rinsing, drying)?: A Lot Help from another person to put on and taking off regular upper body clothing?: A Little Help from  another person to put on and taking off regular lower body clothing?: A Lot 6 Click Score: 16   End of Session    Activity Tolerance: Patient limited by fatigue Patient left: in bed;with call bell/phone within reach;with bed alarm set;with family/visitor present  OT Visit  Diagnosis: Other abnormalities of gait and mobility (R26.89);Muscle weakness (generalized) (M62.81);History of falling (Z91.81);Repeated falls (R29.6)                Time: 1411-1430 OT Time Calculation (min): 19 min Charges:  OT General Charges $OT Visit: 1 Visit OT Evaluation $OT Eval Moderate Complexity: 1 [address], [place], OTR/L , CBIS ascom [phone]  01/10/23, 4:00 PM

## 2023-01-10 NOTE — ED Notes (Signed)
Report to hunter, rn.  

## 2023-01-10 NOTE — Progress Notes (Signed)
Palliative Care Progress Note, Assessment & Plan   Patient Name: Ashley Lynch       Date: 01/10/2023 DOB: 06/20/1945  Age: 77 y.o. MRN#: [id number] Attending Physician: Gillis Santa, MD Primary Care Physician: Margaretann Loveless, MD Admit Date: 01/09/2023  Subjective: Patient is sitting up in bed in no apparent distress.  She acknowledges my presence and is able to make her wishes known.  Her daughter and granddaughter were present at bedside during my visit.  HPI: 77 y.o. female  with past medical history of multivessel CAD s/p PCI and angioplasty, ischemic cardiomyopathy, chronic HFrEF with EF 25-30%, CKD stage 3b, PVD, HLD, and recent hospitalization for CHF exacerbation (9/14 - 9/19) admitted on 01/09/2023 with fall at home with low back/tailbone pain.   Patient is familiar to our service as we followed her during her previous admission.   PMT was consulted to discuss goals of care  Summary of counseling/coordination of care: Extensive chart review completed prior to meeting patient including labs, vital signs, imaging, progress notes, orders, and available advanced directive documents from current and previous encounters.   After reviewing the patient's chart and assessing the patient at bedside, I spoke with patient and family in regards to symptom management and goals of care.  Daughter shares she was hesitant to move forward with paracentesis but now sees that it was the right decision for her mother.  We discussed that the paracentesis provided relief and helped her mother's ability to breathe without pain.  [name] shares she wants to continue to provide palliative care to her mother.  Again discussed that palliative care comes in many forms and in her mother's case is designed to treat the treatable  while avoiding aggressive measures.  Rondo remains in agreement.  We discussed pain management.  [name] endorses patient is not herself and is unable to be awake and alert when given morphine.  We discussed use medications by mouth to better control patient's pain.  As per chart review, patient has as needed Tylenol.  Discussed scheduling Tylenol to provide consistent based coverage for patient's pain management.  Additionally, I would recommend oxycodone IR 5 mg every 6 hours as needed for breakthrough pain.  Finally, I would leave the Dilaudid IV available for patient in case of severe pain/rescue medication.  [name] and patient in agreement with adjusting these medications.  [name] also shares that patient is having great difficulty with controlling her urination due to doses of Lasix.  Patient is unable to move out of bed to bedside commode without great pain.  She is going frequently and see soiling herself often.  Discussed use of pure wick.  I would recommend a pure wick to be placed.  [name] and I briefly discussed hospice and hospice services yesterday.  She shares she would like to speak with her mother about this sometime tomorrow.  I plan to return bedside tomorrow to discuss comfort measures and hospice services.  DNR with limited interventions remains.  PMT will continue to follow and support patient and family throughout her hospitalization.  Physical Exam Constitutional:      General: She is not in acute distress.    Appearance: She is normal weight.  HENT:     Head: Normocephalic.     Mouth/Throat:     Mouth: Mucous membranes are moist.  Eyes:     Pupils: Pupils are equal, round, and reactive to light.  Pulmonary:     Effort: Pulmonary effort is normal.  Abdominal:     Palpations: Abdomen is soft.  Neurological:     General: No focal deficit present.     Mental Status: She is alert and oriented to person, place, and time.  Psychiatric:        Mood and Affect: Mood  normal.        Judgment: Judgment normal.            Total Time 50 minutes   Time spent includes: Detailed review of medical records (labs, imaging, vital signs), medically appropriate exam (mental status, respiratory, cardiac, skin), discussed with treatment team, counseling and educating patient, family and staff, documenting clinical information, medication management and coordination of care.  Samara Deist L. Bonita Quin, DNP, FNP-BC Palliative Medicine Team

## 2023-01-10 NOTE — Progress Notes (Signed)
[facility] Progress Note  Patient: Ashley Lynch    QIO:[id number]  DOA: 01/09/2023     Date of Service: the patient was seen and examined on 01/10/2023  Chief Complaint  Patient presents with   Hip Pain   Brief hospital course: Ashley Lynch is a 77 y.o. 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   Patient was originally hospitalized for CHF decompensation, patient was diuresed and symptoms improved and discharged home.  Patient however had very unsteady gait at home and fell several times upon arriving home than started to have severe bilateral hip pain and became more bedbound for the last 3 to 4 days.  And increasingly she has been having more shortness of breath.  Denies any chest pain but rather complains severe back pain and bilateral hip pain. ED Course: Afebrile, blood pressure significantly elevated.  Chest x-ray showed bilateral pulmonary congestion.  CT lumbar spine showed subacute T11 compression fracture.  Otherwise CT pelvic and CT head and neck negative for acute fracture or dislocation.  Blood work showed creatinine 1.4, K3.1 hemoglobin 14, WBC 7.5.   Assessment and Plan: # Acute on chronic HFrEF decompensation -Appears to have uncontrolled hypertension which was new.  Clinically suspect uncontrolled hypertension probably related to uncontrolled back pain which appears to be disabling. -s/p diuresis with Lasix -INO's and BMP daily -Patient agreed with palliative care.  Palliative consulted in ED.    # Pleural effusion bilateral Most likely due to CHF 9/25 status post right thoracentesis 1 L fluid was stable by IR F/u Fluid studies   Subacute T11 compression fracture - CT showed subacute T11 compression fracture which likely related  to recent fall that happened last week which was confirmed by family.  Patient is not a surgical candidate given her cardiac function and overall conditions.  TLSO brace was ordered, did use while out of bed. F/u PT/OT eval Continue Tylenol as needed for pain control, minimize opiates which is making her confused. Palliative care appreciated Spine surgery has been consulted    HTN, uncontrolled -Probably secondary to poorly controlled back fracture pain -Pain control as above -As needed hydralazine   Hypokalemia, potassium repleted. Monitor electrolytes  Chronic A-fib -Rate controlled, continue low-dose metoprolol -Continue Eliquis   CKD stage IV -Presented with bilateral pleural effusion no lower extremity edema S/p IV Lasix, follow-up daily BMP    CAD, PVD -Off Plavix, continue statin   There is no height or weight on file to calculate BMI.  Interventions:  Diet: Regular diet with fluid restriction 1.5 L/day DVT Prophylaxis: Therapeutic Anticoagulation with Eliquis    Advance goals of care discussion: DNR/ Limited code  Family Communication: family was present at bedside, at the time of interview.  The pt provided permission to discuss medical plan with the family. Opportunity was given to ask question and all questions were answered satisfactorily.  Discussed with patient's daughter at bedside, all question and concerns answered.  Disposition:  Pt is from Home, admitted with Fall and SOB, found to have T11 fracture and bilateral pleural effusion, s/p thoracentesis, still has intractable back pain, which precludes a safe discharge. Discharge to Home with HH vs SNF TBD after PT/OT eval, when stable, may need 1-2 days stay in the hospital.  Subjective: No significant events overnight,  patient still has pain in the back unable to give me the number.  Patient has shortness of breath but seems to be resting comfortably, not in acute distress.  Patient is slightly confused  could be secondary to opiates as per patient's daughter.   Physical Exam: General: NAD, lying comfortably Appear in no distress, affect appropriate Eyes: PERRLA ENT: Oral Mucosa Clear, moist  Neck: no JVD,  Cardiovascular: S1 and S2 Present, no Murmur,  Respiratory: Equal air entry bilaterally, bibasilar crackles, decreased breath sounds on the right side.  No wheezing Abdomen: Bowel Sound present, Soft and no tenderness,  Skin: no rashes Extremities: no Pedal edema, no calf tenderness Neurologic: without any new focal findings Gait not checked due to patient safety concerns  Vitals:   01/10/23 1147 01/10/23 1215 01/10/23 1302 01/10/23 1606  BP: (!) 144/68 105/64 (!) 136/58 (!) 132/58  Pulse: (!) 56 (!) 59 64 62  Resp:   20 16  Temp:   98.3 F (36.8 C) 98.1 F (36.7 C)  TempSrc:   Oral   SpO2: (!) 87% 95% 99% 94%   No intake or output data in the 24 hours ending 01/10/23 1624 There were no vitals filed for this visit.  Data Reviewed: I have personally reviewed and interpreted daily labs, tele strips, imagings as discussed above. I reviewed all nursing notes, pharmacy notes, vitals, pertinent old records I have discussed plan of care as described above with RN and patient/family.  CBC: Recent Labs  Lab 01/04/23 0924 01/09/23 1439  WBC 8.9 7.5  NEUTROABS 6.6 4.8  HGB 12.7 13.1  HCT 39.7 41.4  MCV 90.2 92.0  PLT 120* 172   Basic Metabolic Panel: Recent Labs  Lab 01/04/23 0924 01/09/23 1439 01/10/23 0446  NA 138 142 144  K 3.6 3.1* 3.1*  CL 106 108 108  CO2 22 23 26   GLUCOSE 104* 94 97  BUN 43* 28* 27*  CREATININE 1.78* 1.45* 1.61*  CALCIUM 8.1* 8.1* 8.0*  MG  --   --  2.2  PHOS  --   --  3.7    Studies: DG Chest Port 1 View  Result Date: 01/10/2023 CLINICAL DATA:  Provided history: Status post thoracentesis. EXAM: PORTABLE CHEST 1 VIEW COMPARISON:  Prior chest radiographs 01/09/2023 and earlier. FINDINGS: Cardiomegaly. Aortic atherosclerosis.  Significant interval decrease in size of a right pleural effusion status post thoracentesis. Only a trace residual right pleural effusion remains. No evidence of pneumothorax. Mild ill-defined opacities within the right lung base, favored to reflect atelectasis. Opacity within the medial left lung base has decreased from the prior examination of 01/09/2023. Background chronic prominence of the interstitial lung markings. No acute osseous abnormality identified. Spondylosis and dextrocurvature of the thoracic and visualized upper thoracic spine. Surgical clips within the lower right neck. IMPRESSION: 1. No evidence of pneumothorax status post right thoracentesis. Only a trace residual right pleural effusion remains. 2. Mild ill-defined opacities within the right lung base, favored to reflect atelectasis. 3. Opacity within the medial left lung base, decreased from the prior examination of 01/09/2023. This may reflect atelectasis and/or airspace consolidation. 4. Cardiomegaly 5. Aortic Atherosclerosis (ICD10-I70.0). Electronically Signed   By: Jackey Loge D.O.   On: 01/10/2023 14:30   US THORACENTESIS ASP PLEURAL SPACE W/IMG GUIDE  Result Date: 01/10/2023 INDICATION: 77 year old female referred for right-sided thoracentesis EXAM: ULTRASOUND GUIDED RIGHT THORACENTESIS MEDICATIONS: None. COMPLICATIONS: None PROCEDURE: An ultrasound guided thoracentesis was thoroughly discussed with the patient and questions answered. The benefits, risks, alternatives  and complications were also discussed. The patient understands and wishes to proceed with the procedure. Written consent was obtained. Ultrasound was performed to localize and mark an adequate pocket of fluid in the right chest. The area was then prepped and draped in the normal sterile fashion. 1% Lidocaine was used for local anesthesia. Under ultrasound guidance a 19 gauge, 7-cm, Yueh catheter was introduced. Thoracentesis was performed. The catheter was removed and a  dressing applied. FINDINGS: A total of approximately 1 L of thin yellow fluid was removed. Samples were sent to the laboratory as requested by the clinical team. IMPRESSION: Status post ultrasound-guided right-sided thoracentesis Signed, Yvone Neu. Miachel Roux, RPVI [facility] Electronically Signed   By: Gilmer Mor D.O.   On: 01/10/2023 13:26    Scheduled Meds:  acetaminophen  650 mg Oral Q6H   apixaban  2.5 mg Oral BID   atorvastatin  80 mg Oral Daily   lidocaine (PF)  10 mL Intradermal Once   metoprolol succinate  12.5 mg Oral Daily   nicotine  14 mg Transdermal Daily   pantoprazole  40 mg Oral Daily   sodium chloride flush  3 mL Intravenous Q12H   Continuous Infusions:  sodium chloride     PRN Meds: sodium chloride, hydrALAZINE, HYDROmorphone (DILAUDID) injection, ondansetron (ZOFRAN) IV, oxyCODONE, sodium chloride flush  Time spent: 35 minutes  Author: Gillis Santa. MD [facility] 01/10/2023 4:24 PM  To reach On-call, see care teams to locate the attending and reach out to them via [email or web address]. If 7PM-7AM, please contact night-coverage If you still have difficulty reaching the attending provider, please page the [facility] (Director on Call) for [facility] on amion for assistance.

## 2023-01-10 NOTE — Consult Note (Signed)
Consulting Department:  Inpatient Medicine  Primary Physician:  Margaretann Loveless, MD  Chief Complaint:  Thoracic Compression Fracture.   History of Present Illness: 01/10/2023 Ashley Lynch is a 77 y.o. female who presents with the chief complaint of new thoracic compression fracture.  She has a very complicated past medical history including coronary artery disease, cardiomyopathy, Eliquis, heart failure, kidney disease who is brought in for shortness of breath as well as buttocks pain.  She has had a significant number of falls recently and fell on her butt and had significant pain ever since that time.  Her family member nearby says that they were worried about her fracturing her tailbone.  On her workup she was found to have a thoracic compression fracture.  She denies any new numbness weakness or tingling.  She says she has always felt diffusely weak but has not noticed to exacerbation after her fall.  No bowel or bladder dysfunction.  She is having increased work of breathing.   Review of Systems:  A 10 point review of systems is negative, except for the pertinent positives and negatives detailed in the HPI.  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)    Myocardial infarction (HCC) 04/23/2021   NSTEMI   PAD (peripheral artery disease) [facility])     Past Surgical History: Past Surgical History:  Procedure Laterality Date   ABDOMINAL HYSTERECTOMY  04/18/2011   CARDIAC CATHETERIZATION  04/25/2021   CATARACT EXTRACTION W/PHACO Left 09/20/2021   Procedure: CATARACT EXTRACTION PHACO AND INTRAOCULAR LENS PLACEMENT  (IOC) LEFT  18.07 01:25.3;  Surgeon: Galen Manila, MD;  Location: [facility];  Service: Ophthalmology;  Laterality: Left;   CATARACT EXTRACTION W/PHACO Right 10/04/2021   Procedure: CATARACT EXTRACTION PHACO AND INTRAOCULAR LENS PLACEMENT (IOC) RIGHT  5.68 00:44.8;  Surgeon: Galen Manila, MD;  Location: [facility];  Service: Ophthalmology;  Laterality: Right;   GALLBLADDER SURGERY  1975 1981   LEFT HEART CATH AND CORONARY ANGIOGRAPHY N/A 01/13/2020   Procedure: LEFT HEART CATH AND CORONARY ANGIOGRAPHY;  Surgeon: Laurier Nancy, MD;  Location: [facility];  Service: Cardiovascular;  Laterality: N/A;   TONSILLECTOMY  04/17/2008    Allergies: Allergies as of 01/09/2023   (No Known Allergies)    Medications:  Current Facility-Administered Medications:    0.9 %  sodium chloride infusion, 250 mL, Intravenous, PRN, Mikey College T, MD   acetaminophen (TYLENOL) tablet 650 mg, 650 mg, Oral, Q4H PRN, Mikey College T, MD   apixaban Everlene Balls) tablet 2.5 mg, 2.5 mg, Oral, BID, Chipper Herb, Ping T, MD, 2.5 mg at 01/09/23 2145   atorvastatin (LIPITOR) tablet 80 mg, 80 mg, Oral, Daily, Mikey College T, MD   hydrALAZINE (APRESOLINE) injection 5 mg, 5 mg, Intravenous, Q6H PRN, Mikey College T, MD   HYDROmorphone (DILAUDID) injection 0.5 mg, 0.5 mg, Intravenous, Q4H PRN, Mikey College T, MD   metoprolol succinate (TOPROL-XL) 24 hr tablet 12.5 mg, 12.5 mg, Oral, Daily, Mikey College T, MD, 12.5 mg at 01/09/23 2100  nicotine (NICODERM CQ - dosed in mg/24 hours) patch 14 mg, 14 mg, Transdermal, Daily, Zhang, Ilda Foil T, MD   ondansetron [facility]) injection 4 mg, 4 mg, Intravenous, Q6H PRN, Emeline General, MD   pantoprazole (PROTONIX) EC tablet 40 mg, 40 mg, Oral, Daily, Zhang, Ping T, MD   potassium chloride (KLOR-CON) packet 40 mEq, 40 mEq, Oral, Once, Gillis Santa, MD   sodium chloride flush (NS) 0.9 % injection 3 mL, 3 mL, Intravenous, Q12H, Zhang, Ilda Foil T, MD   sodium chloride flush (NS) 0.9  % injection 3 mL, 3 mL, Intravenous, PRN, Emeline General, MD  Current Outpatient Medications:    apixaban (ELIQUIS) 2.5 MG TABS tablet, Take 1 tablet (2.5 mg total) by mouth 2 (two) times daily., Disp: 60 tablet, Rfl: 0   atorvastatin (LIPITOR) 80 MG tablet, Take 1 tablet (80 mg total) by mouth daily., Disp: 30 tablet, Rfl: 0   furosemide (LASIX) 40 MG tablet, Take 1 tablet (40 mg total) by mouth daily., Disp: 30 tablet, Rfl: 0   metoprolol succinate (TOPROL-XL) 25 MG 24 hr tablet, Take 0.5 tablets (12.5 mg total) by mouth daily., Disp: 15 tablet, Rfl: 0   aspirin 81 MG tablet, Take 81 mg by mouth daily. (Patient not taking: Reported on 09/19/2021), Disp: , Rfl:    GEMTESA 75 MG TABS, Take 75 mg by mouth daily. (Patient not taking: Reported on 12/30/2022), Disp: , Rfl:    Multiple Vitamin (MULTIVITAMIN WITH MINERALS) TABS tablet, Take 1 tablet by mouth daily. (Patient not taking: Reported on 12/30/2022), Disp: 30 tablet, Rfl: 1   NICODERM CQ 14 MG/24HR patch, Place 1 patch onto the skin daily., Disp: , Rfl:    nitroGLYCERIN (NITROSTAT) 0.4 MG SL tablet, Place 0.4 tablets under the tongue daily as needed. (Patient not taking: Reported on 12/30/2022), Disp: , Rfl:    omeprazole (PRILOSEC) 20 MG capsule, Take 1 capsule (20 mg total) by mouth daily. (Patient not taking: Reported on 12/30/2022), Disp: 90 capsule, Rfl: 3   Social History: Social History   Tobacco Use   Smoking status: Every Day    Current packs/day: 1.00    Average packs/day: 1 pack/day for 60.0 years (60.0 ttl pk-yrs)    Types: Cigarettes   Smokeless tobacco: Never  Vaping Use   Vaping status: Never Used  Substance Use Topics   Alcohol use: No   Drug use: No    Family Medical History: Family History  Problem Relation Age of Onset   Diabetes Mother        sibling and grandparent also    Physical Examination: Vitals:   01/10/23 0508 01/10/23 0530  BP:  (!) 150/64  Pulse:  76  Resp:  14  Temp: 97.9 F (36.6 C)   SpO2:   99%     General: Patient is well developed, well nourished, calm, collected, and in no apparent distress.  NEUROLOGICAL:  General: In no acute distress.   Awake, alert, oriented to person, place, and time.  Pupils equal round and reactive to light.  Facial tone is symmetric.  Tongue protrusion is midline.  There is no pronator drift.  Strength: She has at least antigravity strength in bilateral lower extremities proximally and distally.  She does have a significant amount of pain while is moving so full confrontational testing was difficult to grade  Bilateral upper and lower extremity sensation is intact to light touch.  Reflexes are 1+ at the Achilles and patella Clonus is not present.  Toes are  down-going.    Patient currently in bed gait was deferred  Imaging: Narrative & Impression  CLINICAL DATA:  Back trauma, no prior imaging (Age >= 16y). Hip pain. Recent fall with inability to ambulate.   EXAM: CT LUMBAR SPINE WITHOUT CONTRAST   TECHNIQUE: Multidetector CT imaging of the lumbar spine was performed without intravenous contrast administration. Multiplanar CT image reconstructions were also generated.   RADIATION DOSE REDUCTION: This exam was performed according to the departmental dose-optimization program which includes automated exposure control, adjustment of the mA and/or kV according to patient size and/or use of iterative reconstruction technique.   COMPARISON:  CT lumbar spine 11/04/2022   FINDINGS: Segmentation: 5 lumbar type vertebrae.   Alignment: Unchanged and near anatomic.   Vertebrae: T11 superior endplate compression fracture with 30% vertebral body height loss and a visible, of incompletely healed fracture line with surrounding sclerosis suggestive of a subacute time course. Chronic L3 compression fracture with unchanged height loss and 6 mm retropulsion. Diffuse osteopenia. No suspicious bone lesion.   Paraspinal and other soft tissues:  Partially visualized pleural effusions, right larger than left. Extensive atherosclerosis of the abdominal aorta and its branch vessels.   Disc levels: Unchanged moderate spinal stenosis at L2-3 due to retropulsion of the L3 superior endplate and posterior element hypertrophy. Up to moderate neural foraminal stenosis from L2-3 through L5-S1, also unchanged.   IMPRESSION: 1. Subacute appearing T11 compression fracture with 30% height loss. 2. Unchanged chronic L3 compression fracture with retropulsion and moderate spinal stenosis. 3. Partially visualized right larger than left pleural effusions. 4.  Aortic Atherosclerosis (ICD10-I70.0).     Electronically Signed   By: Sebastian Ache M.D.   On: 01/09/2023 17:03     I have personally reviewed the images and agree with the above interpretation.  Labs:    Latest Ref Rng & Units 01/09/2023    2:39 PM 01/04/2023    9:24 AM 01/01/2023    5:38 AM  CBC  WBC 4.0 - 10.5 K/uL 7.5  8.9  8.0   Hemoglobin 12.0 - 15.0 g/dL 62.1  30.8  65.7   Hematocrit 36.0 - 46.0 % 41.4  39.7  38.7   Platelets 150 - 400 K/uL 172  120  112        Assessment and Plan: Ashley Lynch is a pleasant 77 y.o. female with a complicated past medical history including coronary artery disease, kidney disease, congestive heart failure who has had a few recent falls.  She recently fell on her buttocks and has had severe buttocks region pain ever since that time.  On part of her workup she was found to have a thoracic compression fracture.  She is not having any new numbness weakness or tingling.  No radiating pain.  She does have increased work of breathing and is currently being worked up for her congestive heart failure and increased fluid load.  From a neurosurgical standpoint she has a chronic L3 compression fracture with some retropulsion which has not shown any changes.  At T11 there is new approximately 30% height loss compression fracture with no retropulsion.  She appears  to have mostly buttocks pain, but if needed can wear a TLSO for comfort while up and out of bed.  This is not likely to be unstable and does not need spine precautions nor does she need to wear the brace while in bed.  Given the confirmation of the brace this could make her breathing more difficult and cause skin breakdown issues  so as needed bracing would be ideal.  She can follow-up in clinic with upright x-rays.  I have discussed the condition with the patient, including showing the radiographs and discussing treatment options in layman's terms.  The patient may benefit from conservative management.   Lovenia Kim, MD/MSCR Dept. of Neurosurgery

## 2023-01-11 ENCOUNTER — Inpatient Hospital Stay: Payer: Medicare PPO

## 2023-01-11 DIAGNOSIS — S22080A Wedge compression fracture of T11-T12 vertebra, initial encounter for closed fracture: Secondary | ICD-10-CM | POA: Diagnosis not present

## 2023-01-11 DIAGNOSIS — Z515 Encounter for palliative care: Secondary | ICD-10-CM | POA: Diagnosis not present

## 2023-01-11 DIAGNOSIS — I5023 Acute on chronic systolic (congestive) heart failure: Secondary | ICD-10-CM | POA: Diagnosis not present

## 2023-01-11 LAB — BASIC METABOLIC PANEL
Anion gap: 6 (ref 5–15)
BUN: 32 mg/dL — ABNORMAL HIGH (ref 8–23)
CO2: 24 mmol/L (ref 22–32)
Calcium: 7.5 mg/dL — ABNORMAL LOW (ref 8.9–10.3)
Chloride: 105 mmol/L (ref 98–111)
Creatinine, Ser: 1.77 mg/dL — ABNORMAL HIGH (ref 0.44–1.00)
GFR, Estimated: 29 mL/min — ABNORMAL LOW (ref >60)
Glucose, Bld: 100 mg/dL — ABNORMAL HIGH (ref 70–99)
Potassium: 3.6 mmol/L (ref 3.5–5.1)
Sodium: 137 mmol/L (ref 135–145)

## 2023-01-11 LAB — BODY FLUID CELL COUNT WITH DIFFERENTIAL
Eos, Fluid: 0 %
Lymphs, Fluid: 29 %
Monocyte-Macrophage-Serous Fluid: 48 %
Neutrophil Count, Fluid: 23 %
Total Nucleated Cell Count, Fluid: 317 cu mm

## 2023-01-11 LAB — LACTATE DEHYDROGENASE, PLEURAL OR PERITONEAL FLUID: LD, Fluid: 51 U/L — ABNORMAL HIGH (ref 3–23)

## 2023-01-11 LAB — PROTEIN, PLEURAL OR PERITONEAL FLUID: Total protein, fluid: <3 g/dL

## 2023-01-11 LAB — CBC
HCT: 35.1 % — ABNORMAL LOW (ref 36.0–46.0)
Hemoglobin: 11.4 g/dL — ABNORMAL LOW (ref 12.0–15.0)
MCH: 29.2 pg (ref 26.0–34.0)
MCHC: 32.5 g/dL (ref 30.0–36.0)
MCV: 90 fL (ref 80.0–100.0)
Platelets: 150 10*3/uL (ref 150–400)
RBC: 3.9 MIL/uL (ref 3.87–5.11)
RDW: 16.7 % — ABNORMAL HIGH (ref 11.5–15.5)
WBC: 6.9 10*3/uL (ref 4.0–10.5)
nRBC: 0 % (ref 0.0–0.2)

## 2023-01-11 LAB — PHOSPHORUS: Phosphorus: 3 mg/dL (ref 2.5–4.6)

## 2023-01-11 LAB — VITAMIN D 25 HYDROXY (VIT D DEFICIENCY, FRACTURES): Vit D, 25-Hydroxy: 43.3 ng/mL (ref 30–100)

## 2023-01-11 LAB — MAGNESIUM: Magnesium: 1.8 mg/dL (ref 1.7–2.4)

## 2023-01-11 LAB — BODY FLUID CULTURE W GRAM STAIN

## 2023-01-11 LAB — VITAMIN B12: Vitamin B-12: 427 pg/mL (ref 180–914)

## 2023-01-11 MED ORDER — LIDOCAINE HCL (PF) 1 % IJ SOLN
10.0000 mL | Freq: Once | INTRAMUSCULAR | Status: AC
  Administered 2023-01-11: 10 mL via INTRADERMAL
  Filled 2023-01-11: qty 10

## 2023-01-11 MED ORDER — METOPROLOL SUCCINATE ER 25 MG PO TB24
12.5000 mg | ORAL_TABLET | Freq: Every day | ORAL | Status: DC
  Administered 2023-01-12: 12.5 mg via ORAL
  Filled 2023-01-11: qty 1

## 2023-01-11 NOTE — Progress Notes (Signed)
Occupational Therapy Treatment Patient Details Name: NIALA CABBAGESTALK MRN: [id number] DOB: [birth year] Today's Date: 01/11/2023   History of present illness Pt is a 77 y.o. 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   OT comments  Upon entering the room, pt supine in bed with daughter present in room. Pt appears lethargic but does arouse for participation. Pt washing face with warm cloth with set up A and cues to initiate with increased time to complete tasks. Min A for bed mobility to EOB. Pt able to stand with min A of 2 and takes several side steps to the R to get closer in bed. Pt does fatigue quickly and after sitting on EOB for ~5 minutes and standing she is requesting to return to bed with min A. SLP remains in room for session and daughter present as well.       If plan is discharge home, recommend the following:  A lot of help with bathing/dressing/bathroom;Assistance with cooking/housework;Help with stairs or ramp for entrance;A lot of help with walking and/or transfers;Assist for transportation;Direct supervision/assist for medications management;Direct supervision/assist for financial management;Supervision due to cognitive status   Equipment Recommendations  Other (comment) (defer to next venue of care)       Precautions / Restrictions Precautions Precautions: Fall Required Braces or Orthoses: Spinal Brace Spinal Brace: Thoracolumbosacral orthotic Restrictions Weight Bearing Restrictions: No Other Position/Activity Restrictions: TLSO only for comfort and pt does not have to maintain back precautions per neurosurgery as T11 fx is stable.       Mobility Bed Mobility Overal bed mobility: Needs Assistance Bed Mobility: Supine to Sit, Sit to Supine      Supine to sit: Min assist, HOB elevated, Used rails Sit to supine: Min assist        Transfers Overall transfer level: Needs assistance Equipment used: Rolling walker (2 wheels) Transfers: Sit to/from Stand Sit to Stand: Min assist, +2 physical assistance                 Balance Overall balance assessment: Needs assistance Sitting-balance support: Feet supported Sitting balance-Leahy Scale: Fair     Standing balance support: Bilateral upper extremity supported, During functional activity Standing balance-Leahy Scale: Fair                             ADL either performed or assessed with clinical judgement   ADL Overall ADL's : Needs assistance/impaired     Grooming: Set up;Sitting;Wash/dry face;Wash/dry hands;Supervision/safety                                      Extremity/Trunk Assessment Upper Extremity Assessment Upper Extremity Assessment: Generalized weakness   Lower Extremity Assessment Lower Extremity Assessment: Generalized weakness        Vision Patient Visual Report: No change from baseline            Cognition Arousal: Alert Behavior During Therapy: WFL for tasks assessed/performed, Flat affect Overall Cognitive Status: History of cognitive impairments - at baseline  Pertinent Vitals/ Pain       Pain Assessment Pain Assessment: Faces Faces Pain Scale: Hurts a little bit Pain Location: hip Pain Descriptors / Indicators: Aching, Discomfort Pain Intervention(s): Limited activity within patient's tolerance, Monitored during session, Repositioned         Frequency  Min 1X/week        Progress Toward Goals  OT Goals(current goals can now be found in the care plan section)  Progress towards OT goals: Progressing toward goals      AM-PAC OT "6 Clicks" Daily Activity     Outcome Measure   Help from another person eating  meals?: None Help from another person taking care of personal grooming?: A Little Help from another person toileting, which includes using toliet, bedpan, or urinal?: A Lot Help from another person bathing (including washing, rinsing, drying)?: A Lot Help from another person to put on and taking off regular upper body clothing?: A Little Help from another person to put on and taking off regular lower body clothing?: A Lot 6 Click Score: 16    End of Session    OT Visit Diagnosis: Other abnormalities of gait and mobility (R26.89);Muscle weakness (generalized) (M62.81);History of falling (Z91.81);Repeated falls (R29.6)   Activity Tolerance Patient limited by fatigue;Patient tolerated treatment well   Patient Left in bed;with call bell/phone within reach;with bed alarm set;with family/visitor present   Nurse Communication Other (comment)        Time: 1610-9604 OT Time Calculation (min): 20 min  Charges: OT General Charges $OT Visit: 1 Visit OT Treatments $Therapeutic Activity: 8-22 mins  Jackquline Denmark, MS, OTR/L , CBIS ascom [phone]  01/11/23, 12:54 PM

## 2023-01-11 NOTE — Plan of Care (Signed)

## 2023-01-11 NOTE — Progress Notes (Addendum)
[facility] Progress Note  Patient: Ashley Lynch    KVQ:[id number]  DOA: 01/09/2023     Date of Service: the patient was seen and examined on 01/11/2023  Chief Complaint  Patient presents with   Hip Pain   Brief hospital course: SHANTARA GOOSBY is a 77 y.o. 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   Patient was originally hospitalized for CHF decompensation, patient was diuresed and symptoms improved and discharged home.  Patient however had very unsteady gait at home and fell several times upon arriving home than started to have severe bilateral hip pain and became more bedbound for the last 3 to 4 days.  And increasingly she has been having more shortness of breath.  Denies any chest pain but rather complains severe back pain and bilateral hip pain. ED Course: Afebrile, blood pressure significantly elevated.  Chest x-ray showed bilateral pulmonary congestion.  CT lumbar spine showed subacute T11 compression fracture.  Otherwise CT pelvic and CT head and neck negative for acute fracture or dislocation.  Blood work showed creatinine 1.4, K3.1 hemoglobin 14, WBC 7.5.   Assessment and Plan: # Acute on chronic HFrEF decompensation -Appears to have uncontrolled hypertension which was new.  Clinically suspect uncontrolled hypertension probably related to uncontrolled back pain which appears to be disabling. -s/p diuresis with Lasix -INO's and BMP daily -Patient agreed with palliative care.  Palliative consulted in ED.    # Pleural effusion bilateral Most likely due to CHF 9/25 status post right thoracentesis 1 L fluid was stable by IR Fluid studies consistent with transudative Patient agreed for thoracentesis on the left side   Subacute T11 compression  fracture - CT showed subacute T11 compression fracture which likely related to recent fall that happened last week which was confirmed by family.  Patient is not a surgical candidate given her cardiac function and overall conditions.  TLSO brace was ordered, did use while out of bed. F/u PT/OT eval Continue Tylenol as needed for pain control, minimize opiates which is making her confused. Palliative care appreciated Spine surgery has been consulted    HTN, uncontrolled -Probably secondary to poorly controlled back fracture pain -Pain control as above -As needed hydralazine   Hypokalemia, potassium repleted. Monitor electrolytes  Chronic A-fib -Rate controlled, continue low-dose metoprolol -Continue Eliquis   CKD 3b/4, fluctuating Cr/eGFR -Presented with bilateral pleural effusion no lower extremity edema S/p IV Lasix, follow-up daily BMP    CAD, PVD -Off Plavix, continue statin  Palliative care consulted for goals of care discussion, family decided for hospice care  Body mass index is 19.1 kg/m.  Interventions:  Diet: Regular diet with fluid restriction 1.5 L/day DVT Prophylaxis: Therapeutic Anticoagulation with Eliquis    Advance goals of care discussion: DNR/ Limited code  Family Communication: family was present at bedside, at the time of interview.  The pt provided permission to discuss medical plan with the family. Opportunity was given to ask question and all questions were answered satisfactorily.  Discussed with patient's daughter at bedside, all question and concerns answered.  Disposition:  Pt is from Home, admitted with Fall and SOB, found to have T11 fracture and bilateral pleural effusion, s/p thoracentesis, still has intractable back pain, which precludes a safe discharge. Discharge to hospice care, follow TOC for placement  Subjective: No significant events overnight, patient was lying comfortably in the bed, without any significant respiratory distress.   Patient stated that she is feeling wonderful, no active issues. She was motivated to work with PT and bedside. Patient does have a mild cognitive decline, doing home which she understands but she did agree for left-sided thoracentesis.   Physical Exam: General: NAD, lying comfortably Appear in no distress, affect appropriate Eyes: PERRLA ENT: Oral Mucosa Clear, moist  Neck: no JVD,  Cardiovascular: S1 and S2 Present, no Murmur,  Respiratory: Equal air entry bilaterally, mild bibasilar crackles, decreased breath sounds on the left lower chest.  No wheezing Abdomen: Bowel Sound present, Soft and no tenderness,  Skin: no rashes Extremities: no Pedal edema, no calf tenderness Neurologic: without any new focal findings Gait not checked due to patient safety concerns  Vitals:   01/11/23 0347 01/11/23 0603 01/11/23 0917 01/11/23 1236  BP: 119/63  (!) 111/48 (!) 104/44  Pulse: 68  (!) 48 (!) 51  Resp: 18  17 17   Temp: 97.8 F (36.6 C)  (!) 96.1 F (35.6 C) 97.9 F (36.6 C)  TempSrc:      SpO2: 99%  100% 94%  Weight:  48.9 kg      Intake/Output Summary (Last 24 hours) at 01/11/2023 1512 Last data filed at 01/11/2023 1226 Gross per 24 hour  Intake 120 ml  Output 50 ml  Net 70 ml   Filed Weights   01/11/23 0603  Weight: 48.9 kg    Data Reviewed: I have personally reviewed and interpreted daily labs, tele strips, imagings as discussed above. I reviewed all nursing notes, pharmacy notes, vitals, pertinent old records I have discussed plan of care as described above with RN and patient/family.  CBC: Recent Labs  Lab 01/09/23 1439 01/11/23 0436  WBC 7.5 6.9  NEUTROABS 4.8  --   HGB 13.1 11.4*  HCT 41.4 35.1*  MCV 92.0 90.0  PLT 172 150   Basic Metabolic Panel: Recent Labs  Lab 01/09/23 1439 01/10/23 0446 01/11/23 0436  NA 142 144 137  K 3.1* 3.1* 3.6  CL 108 108 105  CO2 23 26 24   GLUCOSE 94 97 100*  BUN 28* 27* 32*  CREATININE 1.45* 1.61* 1.77*  CALCIUM  8.1* 8.0* 7.5*  MG  --  2.2 1.8  PHOS  --  3.7 3.0    Studies: No results found.  Scheduled Meds:  acetaminophen  650 mg Oral Q6H   apixaban  2.5 mg Oral BID   atorvastatin  80 mg Oral Daily   lidocaine (PF)  10 mL Intradermal Once   [START ON 01/12/2023] metoprolol succinate  12.5 mg Oral Daily   nicotine  14 mg Transdermal Daily   pantoprazole  40 mg Oral Daily   sodium chloride flush  3 mL Intravenous Q12H   Continuous Infusions:  sodium chloride     PRN Meds: sodium chloride, hydrALAZINE, HYDROmorphone (DILAUDID) injection, ondansetron (ZOFRAN) IV, oxyCODONE, sodium chloride flush  Time spent: 35 minutes  Author: Gillis Santa. MD [facility] 01/11/2023 3:12 PM  To reach On-call, see care teams to locate the attending and reach out to them via [email or web address]. If 7PM-7AM, please contact night-coverage If you still have difficulty reaching the attending provider, please page the [facility] (Director on Call) for [facility] on amion for assistance.

## 2023-01-11 NOTE — Progress Notes (Signed)
Physical Therapy Treatment Patient Details Name: Ashley Lynch MRN: [id number] DOB: 06/30/1945 Today's Date: 01/11/2023   History of Present Illness Pt is a 77 y.o. 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    PT Comments  Pt is received in bed with daughter [name] at bedside, she is agreeable to PT session. PT performs bed mobility GCA-min A and transfers min A. Pt requires min A for BLE management to initiate bed mobility but able to progress to CGA. Unable to progress mobility at this time secondary to increased fatigue and reports of mild hip discomfort. Able to SPT from bed to recliner min A with assist of AD management, however, Pt was unable to tolerate sitting in recliner due to hip pain and requested to go back into bed. At this time, Pt demonstrates improved mobility as seen by requiring less assistance by PT and progressing towards PT goals. MD came in during session to discuss possible plan for thoracentesis to L lung tomorrow (9/27). Pt would benefit from cont skilled PT to address above deficits and promote optimal return to PLOF.     If plan is discharge home, recommend the following: A little help with walking and/or transfers;A little help with bathing/dressing/bathroom;Assistance with cooking/housework;Supervision due to cognitive status;Help with stairs or ramp for entrance   Can travel by private vehicle     No  Equipment Recommendations  Other (comment) (TBD at next facility)    Recommendations for Other Services       Precautions / Restrictions Precautions Precautions: Fall Required Braces or Orthoses: Spinal Brace Spinal Brace: Thoracolumbosacral orthotic Restrictions Weight Bearing Restrictions: No Other Position/Activity  Restrictions: TLSO only for comfort and pt does not have to maintain back precautions per neurosurgery as T11 fx is stable.     Mobility  Bed Mobility Overal bed mobility: Needs Assistance Bed Mobility: Supine to Sit, Sit to Supine     Supine to sit: Min assist, HOB elevated, Used rails Sit to supine: Contact guard assist, Used rails, HOB elevated   General bed mobility comments: assistance with BLE management to initiate supine>sit but able to progress to CGA when returning to bed    Transfers Overall transfer level: Needs assistance Equipment used: Rolling walker (2 wheels) Transfers: Bed to chair/wheelchair/BSC, Sit to/from Stand Sit to Stand: Min assist   Step pivot transfers: Min assist       General transfer comment: assistance with AD management and hand placement cuing for bed<>recliner    Ambulation/Gait               General Gait Details: deferred at this time due to safety concerns and increased fatigue   Stairs             Wheelchair Mobility     Tilt Bed    Modified Rankin (Stroke Patients Only)       Balance Overall balance assessment: Needs assistance Sitting-balance support: Feet supported Sitting balance-Leahy Scale: Fair Sitting balance - Comments: able to sit EOB CGA for safety secondary to slight unsteadiness   Standing balance support: Bilateral upper extremity supported, During functional activity, Reliant on assistive device for balance Standing balance-Leahy Scale: Fair Standing balance comment: able to maintain static standing balance with cuing to promote pelvic tilt and upright standing posture  Cognition Arousal: Alert Behavior During Therapy: WFL for tasks assessed/performed, Flat affect Overall Cognitive Status: History of cognitive impairments - at baseline                                 General Comments: Pleasant and cooperative with therapy; AO x3 disoriented  to situation        Exercises      General Comments        Pertinent Vitals/Pain Pain Assessment Pain Assessment: Faces Faces Pain Scale: Hurts little more Pain Location: hip Pain Descriptors / Indicators: Dull, Sore Pain Intervention(s): Limited activity within patient's tolerance, Monitored during session, Repositioned    Home Living                          Prior Function            PT Goals (current goals can now be found in the care plan section) Acute Rehab PT Goals Patient Stated Goal: unable to state at this time PT Goal Formulation: With patient Time For Goal Achievement: 01/24/23 Potential to Achieve Goals: Fair Progress towards PT goals: Progressing toward goals    Frequency    Min 1X/week      PT Plan      Co-evaluation              AM-PAC PT "6 Clicks" Mobility   Outcome Measure  Help needed turning from your back to your side while in a flat bed without using bedrails?: A Little Help needed moving from lying on your back to sitting on the side of a flat bed without using bedrails?: A Little Help needed moving to and from a bed to a chair (including a wheelchair)?: A Little Help needed standing up from a chair using your arms (e.g., wheelchair or bedside chair)?: A Little Help needed to walk in hospital room?: A Lot Help needed climbing 3-5 steps with a railing? : A Lot 6 Click Score: 16    End of Session Equipment Utilized During Treatment: Gait belt Activity Tolerance: Patient limited by fatigue Patient left: in bed;with call bell/phone within reach;with bed alarm set;with family/visitor present Nurse Communication: Mobility status PT Visit Diagnosis: Unsteadiness on feet (R26.81);Muscle weakness (generalized) (M62.81)     Time: 0865-7846 PT Time Calculation (min) (ACUTE ONLY): 15 min  Charges:    $Therapeutic Activity: 8-22 mins PT General Charges $$ ACUTE PT VISIT: 1 Visit                     Elmon Else, SPT    Aristeo Hankerson 01/11/2023, 1:01 PM

## 2023-01-11 NOTE — Procedures (Signed)
PROCEDURE SUMMARY:  Successful US guided left thoracentesis. Yielded 250 mL of clear yellow fluid. Pt tolerated procedure well. No immediate complications.  Specimen was sent for labs. CXR ordered.  EBL < 1 mL  Pattricia Boss D PA-C 01/11/2023 4:00 PM

## 2023-01-11 NOTE — Evaluation (Signed)
Clinical/Bedside Swallow Evaluation Patient Details  Name: Ashley Lynch MRN: [id number] Date of Birth: 1945/06/27  Today's Date: 01/11/2023 Time: SLP Start Time (ACUTE ONLY): 1025 SLP Stop Time (ACUTE ONLY): 1105 SLP Time Calculation (min) (ACUTE ONLY): 40 min  Past Medical History:  Past Medical History:  Diagnosis Date   Cancer (HCC)    lymphoma   Cardiomyopathy (HCC)    Complication of anesthesia    slow to wake   H/O heart artery stent 12/2019   X2   HFrEF (heart failure with reduced ejection fraction) (HCC)    History of chicken pox    History of measles, mumps, or rubella    HTN (hypertension)    Iliac artery occlusion (HCC)    Bilateral   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)    Myocardial infarction (HCC) 04/23/2021   NSTEMI   PAD (peripheral artery disease) [facility])    Past Surgical History:  Past Surgical History:  Procedure Laterality Date   ABDOMINAL HYSTERECTOMY  04/18/2011   CARDIAC CATHETERIZATION  04/25/2021   CATARACT EXTRACTION W/PHACO Left 09/20/2021   Procedure: CATARACT EXTRACTION PHACO AND INTRAOCULAR LENS PLACEMENT (IOC) LEFT  18.07 01:25.3;  Surgeon: Galen Manila, MD;  Location: [facility];  Service: Ophthalmology;  Laterality: Left;   CATARACT EXTRACTION W/PHACO Right 10/04/2021   Procedure: CATARACT EXTRACTION PHACO AND INTRAOCULAR LENS PLACEMENT (IOC) RIGHT  5.68 00:44.8;  Surgeon: Galen Manila, MD;  Location: [facility];  Service: Ophthalmology;  Laterality: Right;   GALLBLADDER SURGERY  1975 1981   LEFT HEART CATH AND CORONARY ANGIOGRAPHY N/A 01/13/2020   Procedure: LEFT HEART CATH AND CORONARY ANGIOGRAPHY;  Surgeon: Laurier Nancy, MD;  Location: [facility];  Service: Cardiovascular;  Laterality: N/A;   TONSILLECTOMY  04/17/2008   HPI:  Pt is a 77 y.o. 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   Patient was originally hospitalized for CHF Decompensation, patient was diuresed and symptoms improved and discharged home.  Patient however had very unsteady gait at home and fell several times upon arriving home than started to have severe bilateral hip pain and became more bedbound for the last 3 to 4 days.  And increasingly she has been having more shortness of breath.  Denies any chest pain but rather complains severe back pain and bilateral hip pain.   ED Course: Afebrile, blood pressure significantly elevated.  Chest x-ray showed bilateral pulmonary congestion.  CT lumbar spine showed subacute T11 compression fracture.  Pt was just recently admitted ~1 week ago; removal of ~2L of fluid that admit per Dtr.   Per chart notes: 9/25:  Palliative consulted- "discussed pain management.  [name] endorses patient is not herself and is unable to be awake and alert when given morphine.  We discussed use medications by mouth to better control patient's pain.  As per chart review, patient has as needed Tylenol."; "I plan to return bedside tomorrow to discuss comfort measures and hospice services.".  Per MD note: "Patient is slightly confused could be secondary to opiates as per patient's daughter.".    Assessment / Plan / Recommendation  Clinical Impression   Pt seen for BSE today; she was seen by ST services and rec'd a Mech Soft diet consistency w/ thin liquids last (recent) admit. Daughter present stated that  pt has been tolerating an oral diet "ok", she just doesn't eat or drink much". Pt awake to sit EOB w/ OT then upright in bed to feed self few bites of egg from breakfast meal. Pt verbal and followed basic commands w/ OT/SLP. Dtr present during session -- stated pt is "weak". Dtr endorsed that pt eats "bites and sips at home; toast often".  Noted per Palliative Care that pt has Poor Pulmonary status  at Baseline.  On RA; afebrile. WBC WNL.  Pt appears to present w/ functional oropharyngeal phase swallowing w/ No overt oropharyngeal phase dysphagia noted, No neuromuscular deficits noted. Pt consumed po trials w/ No overt, clinical s/s of aspiration during po trials.  Pt appears at reduced risk for aspiration following general aspiration precautions.  However, pt does have challenging factors that could impact her oropharyngeal swallowing to include Pain/discomfort in back(NSG aware), fatigue/weakness, and declined Pulmonary status. These factors can increase risk for aspiration, dysphagia as well as decreased oral intake overall.   During po trials, pt consumed all consistencies w/ no overt coughing, decline in vocal quality, or change in respiratory presentation during/post trials. O2 sats remained in upper 90s when checked(on RA). Oral phase appeared grossly [facility] w/ timely bolus management, mastication, and control of bolus propulsion for A-P transfer for swallowing. Oral clearing achieved w/ all trial consistencies -- moistened, soft foods given only. Rest Breaks were given for conservation of energy d/t pt's generalized weakness sitting EOB; min increased respiratory effort w/ any exertion.  OM Exam appeared [facility] w/ no unilateral weakness noted. Speech Clear. Pt fed self w/ setup support.  Recommend continue a Mech Soft-Regular(for choices) consistency diet w/ well-Cut/Chopped meats, moistened foods; Thin liquids -- NO STRAWS USE, and pt should help to Hold Cup when drinking. Recommend general aspiration precautions, reduce distractions/talking at meals. Tray setup and sitting up positioning for oral intake. REST BREAKS during meals d/t exertion. Pills WHOLE in Puree for safer, easier swallowing -- pt has done this in the past, and it was encourged now and for D/C to the Dtr, pt.   Education given on Pills in Puree; food consistencies and easy to eat options; general aspiration precautions to pt and  Dtr. No further ST services indicated as pt appears at/near her Baseline as per previous BSE last week. MD/NSG to reconsult if any new swallowing needs arise while admitted. Handout given on precautions; posted in room. NSG updated, agreed. MD/Palliative Care updated. Recommend Dietician f/u for support. SLP Visit Diagnosis: Dysphagia, unspecified (R13.10) (generalized weakness)    Aspiration Risk   (reduced following general aspiration precautions)    Diet Recommendation   Thin;Age appropriate regular;Dysphagia 3 (mechanical soft) (cut/chop and moisten foods) = a Mech Soft-Regular(for choices) consistency diet w/ well-Cut/Chopped meats, moistened foods; Thin liquids -- NO STRAWS USE, and pt should help to Hold Cup when drinking. Recommend general aspiration precautions, reduce distractions/talking at meals. Tray setup and sitting up positioning for oral intake. REST BREAKS during meals d/t exertion.   Medication Administration: Whole meds with puree    Other  Recommendations Recommended Consults:  (Palliative Care following; Dietician) Oral Care Recommendations: Oral care BID;Oral care before and after PO;Patient independent with oral care (setup)    Recommendations for follow up therapy are one component of a multi-disciplinary discharge planning process, led by the attending physician.  Recommendations may be updated based on patient status, additional functional criteria and insurance authorization.  Follow up Recommendations Follow physician's recommendations for discharge plan and follow up therapies  Assistance Recommended at Discharge  FULL  Functional Status Assessment Patient has had a recent decline in their functional status and/or demonstrates limited ability to make significant improvements in function in a reasonable and predictable amount of time  Frequency and Duration  (n/a)   (n/a)       Prognosis Prognosis for improved oropharyngeal function: Fair Barriers to Reach  Goals: Cognitive deficits;Time post onset;Severity of deficits;Motivation Barriers/Prognosis Comment: pt presents w/ decreased interest in oral intake overall per Dtr's report      Swallow Study   General Date of Onset: 01/09/23 HPI: Pt is a 77 y.o. 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     Patient was originally hospitalized for CHF decompensation, patient was diuresed and symptoms improved and discharged home.  Patient however had very unsteady gait at home and fell several times upon arriving home than started to have severe bilateral hip pain and became more bedbound for the last 3 to 4 days.  And increasingly she has been having more shortness of breath.  Denies any chest pain but rather complains severe back pain and bilateral hip pain.  ED Course: Afebrile, blood pressure significantly elevated.  Chest x-ray showed bilateral pulmonary congestion.  CT lumbar spine showed subacute T11 compression fracture.  Pt was just recently admitted ~1 week ago; removal of ~2L of fluid that admit per Dtr.  Per chart notes: 9/25:  Palliative consulted- "discussed pain management.  [name] endorses patient is not herself and is unable to be awake and alert when given morphine.  We discussed use medications by mouth to better control patient's pain.  As per chart review, patient has as needed Tylenol."; "I plan to return bedside tomorrow to discuss comfort measures and hospice services.".  Per MD note: "Patient is slightly confused could be secondary to opiates as per patient's daughter.". Type of Study: Bedside Swallow Evaluation Previous Swallow Assessment: 01/02/2023 - functional swallow Diet Prior to this Study: Dysphagia 3 (mechanical soft);Thin liquids (Level 0) Temperature  Spikes Noted: No (wbc 6.9) Respiratory Status: Room air History of Recent Intubation: No Behavior/Cognition: Alert;Cooperative;Pleasant mood;Distractible;Requires cueing Oral Cavity Assessment: Within Functional Limits Oral Care Completed by SLP: Recent completion by staff Oral Cavity - Dentition: Dentures, top;Dentures, bottom Vision: Functional for self-feeding Self-Feeding Abilities: Able to feed self;Needs assist;Needs set up Patient Positioning: Upright in bed (EOB) Baseline Vocal Quality: Low vocal intensity Volitional Cough: Strong;Congested (mildly) Volitional Swallow: Able to elicit    Oral/Motor/Sensory Function Overall Oral Motor/Sensory Function: Within functional limits   Ice Chips Ice chips: Not tested   Thin Liquid Thin Liquid: Within functional limits Presentation: Cup;Self Fed (~12+ trials) Other Comments: water, juice    Nectar Thick Nectar Thick Liquid: Not tested   Honey Thick Honey Thick Liquid: Not tested   Puree Puree: Within functional limits Presentation: Self Fed;Spoon (7+ trials)   Solid     Solid:  (functional for softened foods -- eggs) Presentation: Self Fed;Spoon (5+ boluses) Oral Phase Impairments:  (functional) Pharyngeal Phase Impairments:  (none) Other Comments: encouraged rest breaks as needed for conservation of energy        Jerilynn Som, MS, CCC-SLP Speech Language Pathologist Rehab Services; [facility] [phone] (ascom) Carel Schnee 01/11/2023,4:59 PM

## 2023-01-11 NOTE — Progress Notes (Addendum)
Palliative Care Progress Note, Assessment & Plan   Patient Name: Ashley Lynch       Date: 01/11/2023 DOB: 05/27/1945  Age: 77 y.o. MRN#: [id number] Attending Physician: Gillis Santa, MD Primary Care Physician: Margaretann Loveless, MD Admit Date: 01/09/2023  Subjective: Patient is sitting up in bed in no apparent distress.  SLP and patient's daughter [name] are at bedside.  Patient acknowledges my presence and is able to make her wishes known.  She remains confused but has no acute complaints.  HPI: 77 y.o. female  with past medical history of multivessel CAD s/p PCI and angioplasty, ischemic cardiomyopathy, chronic HFrEF with EF 25-30%, CKD stage 3b, PVD, HLD, and recent hospitalization for CHF exacerbation (9/14 - 9/19) admitted on 01/09/2023 with fall at home with low back/tailbone pain.   Patient is familiar to our service as we followed her during her previous admission.   PMT was consulted to discuss goals of care.  Summary of counseling/coordination of care: Extensive chart review completed prior to meeting patient including labs, vital signs, imaging, progress notes, orders, and available advanced directive documents from current and previous encounters.   After reviewing the patient's chart and assessing the patient at bedside, I spoke with patient's daughter/HCPOA [name] in regards to symptom management and goals of care.  Symptoms assessed.  Patient unable to participate in pain assessment.  Daughter at bedside endorses patient's pain has been better controlled.  No adjustment to [facility] needed at this time.  Attempted to elicit values and goals important to the patient and [name].  [name] shares that she knows her mother is likely making her transition to end-of-life.  She shares her mother's been a  completely different person for the past 2 days.  She understands that some pain medication can make her groggy but that overall she believes her mother's demeanor has turned inward.  She shares she wants to do what is best for her mother but is herself feeling very emotionally and physically exhausted by this hospitalization.  Space and opportunity provided for [name] to share her thoughts and emotions regarding her mother's current medical situation.  Therapeutic silence, active listening, and emotional support provided.  We again discussed that patient does not like to be in the hospital, does not like to go to doctors appointments, and has vocalized in the past that she would like to be at home.  In light of these values, [name] and I again discussed discharging home with hospice services.  Hospice philosophy, aging in place, symptom management, and more holistic approach to care for patient and family reviewed with [name].  [name] fears that if we say hospice to the patient then she will panic and become very upset.  We discussed that we could we reviewed the services and philosophy around hospice without using that trigger word.    [name] is interested in speaking with hospice to know more about what that sort of care will look like for her mother at home.  Attending, TOC, and dayshift RN made aware of [name]'s wishes to speak with hospice agency.  TOC to offer choice of hospice agencies.   Addendum: As per TOC Awanya, patient's HCPOA Clydie Braun was contacted by [facility]  [place] yesterday and chose [facility] hospice services. I conveyed this info to [place].   Goals are clear.  DNR remains.  Plan to DC home with hospice services to follow.  Low symptom burden.  [name] has PMT contact information and were encouraged to contact PMT with any future acute palliative needs.  [name] is aware that PMT will monitor the patient peripherally and shadow her chart.   Please re-engage with PMT if goals change, at  patient/family's request, or if patient's health deteriorates during hospitalization.    Physical Exam Vitals reviewed.  Constitutional:      General: She is not in acute distress.    Appearance: She is normal weight.  HENT:     Head: Normocephalic.     Mouth/Throat:     Mouth: Mucous membranes are moist.  Eyes:     Pupils: Pupils are equal, round, and reactive to light.  Pulmonary:     Effort: Pulmonary effort is normal.  Abdominal:     Palpations: Abdomen is soft.  Musculoskeletal:     Comments: Generalized weakness  Skin:    General: Skin is warm and dry.  Neurological:     Mental Status: She is alert.     Comments: Oriented to self  Psychiatric:        Judgment: Judgment normal.             Total Time 50 minutes   Time spent includes: Detailed review of medical records (labs, imaging, vital signs), medically appropriate exam (mental status, respiratory, cardiac, skin), discussed with treatment team, counseling and educating patient, family and staff, documenting clinical information, medication management and coordination of care.  Samara Deist L. Bonita Quin, DNP, FNP-BC Palliative Medicine Team

## 2023-01-11 NOTE — Plan of Care (Signed)
  Problem: Education: Goal: Knowledge of General Education information will improve Description: Including pain rating scale, medication(s)/side effects and non-pharmacologic comfort measures Outcome: Progressing   Problem: Clinical Measurements: Goal: Respiratory complications will improve Outcome: Progressing Goal: Cardiovascular complication will be avoided Outcome: Progressing   Problem: Nutrition: Goal: Adequate nutrition will be maintained Outcome: Progressing   Problem: Pain Managment: Goal: General experience of comfort will improve Outcome: Progressing   Problem: Safety: Goal: Ability to remain free from injury will improve Outcome: Progressing

## 2023-01-12 ENCOUNTER — Telehealth: Payer: Self-pay | Admitting: Neurosurgery

## 2023-01-12 DIAGNOSIS — I5023 Acute on chronic systolic (congestive) heart failure: Secondary | ICD-10-CM | POA: Diagnosis not present

## 2023-01-12 LAB — CBC
HCT: 37.4 % (ref 36.0–46.0)
Hemoglobin: 11.8 g/dL — ABNORMAL LOW (ref 12.0–15.0)
MCH: 29.5 pg (ref 26.0–34.0)
MCHC: 31.6 g/dL (ref 30.0–36.0)
MCV: 93.5 fL (ref 80.0–100.0)
Platelets: 158 10*3/uL (ref 150–400)
RBC: 4 MIL/uL (ref 3.87–5.11)
RDW: 16.5 % — ABNORMAL HIGH (ref 11.5–15.5)
WBC: 8 10*3/uL (ref 4.0–10.5)
nRBC: 0 % (ref 0.0–0.2)

## 2023-01-12 LAB — BASIC METABOLIC PANEL
Anion gap: 8 (ref 5–15)
BUN: 37 mg/dL — ABNORMAL HIGH (ref 8–23)
CO2: 26 mmol/L (ref 22–32)
Calcium: 7.7 mg/dL — ABNORMAL LOW (ref 8.9–10.3)
Chloride: 104 mmol/L (ref 98–111)
Creatinine, Ser: 1.68 mg/dL — ABNORMAL HIGH (ref 0.44–1.00)
GFR, Estimated: 31 mL/min — ABNORMAL LOW (ref >60)
Glucose, Bld: 94 mg/dL (ref 70–99)
Potassium: 4 mmol/L (ref 3.5–5.1)
Sodium: 138 mmol/L (ref 135–145)

## 2023-01-12 LAB — MAGNESIUM: Magnesium: 1.8 mg/dL (ref 1.7–2.4)

## 2023-01-12 LAB — PATHOLOGIST SMEAR REVIEW

## 2023-01-12 LAB — PHOSPHORUS: Phosphorus: 2.6 mg/dL (ref 2.5–4.6)

## 2023-01-12 MED ORDER — OXYCODONE HCL 5 MG PO TABS: 5.0000 mg | ORAL_TABLET | Freq: Three times a day (TID) | ORAL | 0 refills | Status: DC | PRN

## 2023-01-12 NOTE — TOC Transition Note (Signed)
Transition of Care [facility]) - CM/SW Discharge Note   Patient Details  Name: Ashley Lynch MRN: [id number] Date of Birth: 1945/08/13  Transition of Care [facility]) CM/SW Contact:  Hetty Ely, RN Phone Number: 01/12/2023, 12:13 PM    Final next level of care: Home w Hospice Care Barriers to Discharge: Barriers Resolved   Patient Goals and CMS Choice CMS Medicare.gov Compare Post Acute Care list provided to:: Patient Represenative (must comment) [name]) Choice offered to / list presented to : [facility] POA / Guardian  Discharge Placement                  Patient to be transferred to facility by: [facility] will transport home. Name of family member notified: Alda Lea Patient and family notified of of transfer: 01/12/23  Discharge Plan and Services Additional resources added to the After Visit Summary for                  DME Arranged: N/A [employer] will arrange.) DME Agency: Other - Comment ([facility] to arrange.)         [facility] Agency: Other - See comment [facility]) Date [facility] Agency Contacted: 01/12/23 Time HH Agency Contacted: 1052 Representative spoke with at [facility] Agency: Ileene Rubens  Social Determinants of Health (SDOH) Interventions SDOH Screenings   Food Insecurity: No Food Insecurity (01/03/2023)  Housing: Patient Declined (01/03/2023)  Transportation Needs: No Transportation Needs (01/03/2023)  Utilities: Not At Risk (01/03/2023)  Financial Resource Strain: Low Risk  (04/24/2021)   Received from [facility], [facility]  Tobacco Use: High Risk (12/30/2022)     Readmission Risk Interventions    01/10/2023    9:07 AM 01/03/2023    3:30 PM  Readmission Risk Prevention Plan  Transportation Screening Complete Complete  PCP or Specialist Appt within 3-5 Days Complete Complete  HRI or Home Care Consult Complete Complete  Social Work Consult for Recovery Care Planning/Counseling Complete Complete  Palliative Care Screening Complete Not Complete   Medication Review Oceanographer) Not Complete Complete  Med Review Comments Upon discharge nurse will complete

## 2023-01-12 NOTE — Plan of Care (Signed)

## 2023-01-12 NOTE — Progress Notes (Signed)
PT Cancellation Note  Patient Details Name: Ashley Lynch MRN: [id number] DOB: 02/10/1946   Cancelled Treatment:     PT attempt. Pt refused. Both author and daughter at bedside encouraged participation however pt states, "I just don't feel like it right now." Acute PT will continue to follow and progress per current POC.    Rushie Chestnut 01/12/2023, 10:35 AM

## 2023-01-12 NOTE — Telephone Encounter (Signed)
Thoracic compression fracture, folllow up in 4ish weeks with  upright xrays per Dr.Smith. he did a consult on 01/10/23 so she will be a follow up appt with him.  Patient is still currently admitted in the hospital. We will schedule appt once she is discharged.

## 2023-01-12 NOTE — TOC Progression Note (Signed)
Transition of Care [facility]) - Progression Note    Patient Details  Name: Ashley Lynch MRN: [id number] Date of Birth: 02-Oct-1945  Transition of Care [facility]) CM/SW Contact  Hetty Ely, RN Phone Number: 01/12/2023, 12:16 PM  Clinical Narrative:  CM met with patient and Daughter Alda Lea at bedside about discharge planning. Daughter [name], consents to [facility], says patient granddaughter Hanley Seamen will be staying with her 24/7, patient alert and oriented consents to plan and says her granddaughter takes good care of her, "Her Lawanna Kobus". [name] says she will also be there daily to assist as needed.  [name] spoke with Ileene Rubens, [facility] representative and consented to use the Agency for care. Patient to discharge today, [facility] is scheduled to meet at the home today at 6pm.     Expected Discharge Plan: Home w Hospice Care Barriers to Discharge: Barriers Resolved  Expected Discharge Plan and Services         Expected Discharge Date: 01/12/23               DME Arranged: N/A [facility] will arrange.) DME Agency: Other - Comment ([facility] to arrange.)         [facility] Agency: Other - See comment [facility]) Date [facility] Agency Contacted: 01/12/23 Time HH Agency Contacted: 1052 Representative spoke with at [facility] Agency: Ileene Rubens   Social Determinants of Health (SDOH) Interventions SDOH Screenings   Food Insecurity: No Food Insecurity (01/03/2023)  Housing: Patient Declined (01/03/2023)  Transportation Needs: No Transportation Needs (01/03/2023)  Utilities: Not At Risk (01/03/2023)  Financial Resource Strain: Low Risk  (04/24/2021)   Received from [facility], [facility]  Tobacco Use: High Risk (12/30/2022)    Readmission Risk Interventions    01/10/2023    9:07 AM 01/03/2023    3:30 PM  Readmission Risk Prevention Plan  Transportation Screening Complete Complete  PCP or Specialist Appt within 3-5 Days Complete Complete  HRI or Home  Care Consult Complete Complete  Social Work Consult for Recovery Care Planning/Counseling Complete Complete  Palliative Care Screening Complete Not Complete  Medication Review Oceanographer) Not Complete Complete  Med Review Comments Upon discharge nurse will complete

## 2023-01-12 NOTE — Consult Note (Signed)
Value-Based Care Institute  [facility] Inpatient Consult   01/12/2023  MICHELENA CULMER April 03, 1946 [id number]  [facility] [[facility]]  Accountable Care Organization [ACO] Patient: Francine Graven Medicare Choice PPO  [facility] Liaison remote coverage review for patient admitted to [facility] (for coverage for Elliot Cousin, RN HL)  Primary Care Provider:  Margaretann Loveless, MD, with [facility] is listed to provide the transition of care follow up.  Patient has recently been assigned to [employer] [[facility]] Care Management for care coordination services prior to admission.  Patient has an appointment with a Community RN Care Coordination on 01/15/23 scheduled.  The community based plan of care has focused on disease management and community resource support.    Review of electronic medical record reveals PT/OT are recommending a ST SNF level of care for post hospital transition.  Plan: RN HL will continue to follow for disposition and needs and alert Community RN Care Coordinator of post hospital disposition, and new needs, as known.   Of note, [facility] Care Management services does not replace or interfere with any services that are needed or arranged by inpatient [facility] care management team.   Charlesetta Shanks, RN, BSN, CCM   [facility], [facility] Liaison Direct Dial: [phone] or secure chat Website: [email or web address]

## 2023-01-12 NOTE — Discharge Summary (Signed)
[facility] Discharge Summary   Patient: Ashley Lynch UEA:[id number]  PCP: Margaretann Loveless, MD  Date of admission: 01/09/2023   Date of discharge:  01/12/2023     Discharge Diagnoses:  Principal Problem:   CHF (congestive heart failure) (HCC) Active Problems:   Acute on chronic systolic CHF (congestive heart failure) (HCC)   Fall   Compression fracture of T11 vertebra (HCC)   Admitted From: Home Disposition: Home with hospice services.  Recommendations for Outpatient Follow-up:  Follow with hospice services at home for further management of pain control and symptoms Follow up LABS/TEST: None   Diet recommendation: Regular diet  Activity: The patient is advised to gradually reintroduce usual activities, as tolerated  Discharge Condition: stable  Code Status: DNR/DNI  History of present illness: As per the H and P dictated on admission,  Hospital Course:  Ashley Lynch is a 77 y.o. 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 Patient was originally hospitalized for CHF decompensation, patient was diuresed and symptoms improved and discharged home.  Patient however had very unsteady gait at home and fell several times upon arriving home than started to have severe bilateral hip pain and became more bedbound for the last 3 to 4 days.  And increasingly she has been having more shortness of breath.  Denies any chest pain but rather complains severe back pain and bilateral hip pain. ED Course: Afebrile, blood pressure significantly elevated.  Chest x-ray showed bilateral pulmonary congestion.  CT lumbar spine showed subacute T11 compression fracture.  Otherwise CT pelvic and CT head and neck negative for acute fracture or dislocation.  Blood work showed  creatinine 1.4, K3.1 hemoglobin 14, WBC 7.5.  Assessment and Plan: # Acute on chronic HFrEF decompensation Appears to have uncontrolled hypertension which was new.  Clinically suspect uncontrolled hypertension probably related to uncontrolled back pain which appears to be disabling. -s/p diuresis with Lasix. Patient agreed with palliative care.  Palliative consulted in ED. further discussion was done with family and they agreed for home hospice services.  Resumed home medications. # Pleural effusion bilateral, Most likely due to CHF 9/25 status post right thoracentesis 1 L fluid was stable by IR Fluid studies consistent with transudative 9/26 s/p left thoracentesis to 50 mL fluid was tapped.  Patient tolerated well, postprocedure x-ray did not show any complications, no pneumothorax. 9/27, patient today breathing well on room air, denied any complaints. # Subacute T11 compression fracture CT showed subacute T11 compression fracture which likely related to recent fall that happened last week which was confirmed by family.  Patient is not a surgical candidate given her cardiac function and overall conditions.  TLSO brace was ordered, did use while out of bed. PT/OT eval done. Continue Tylenol and oxycodone  prn for pain. Minimize opiates which is making her confused. Palliative care appreciated. Spine surgery was consulted, recommended no surgical intervention. #HTN, uncontrolled, -Probably secondary to poorly controlled back fracture pain.  BP stable now, continued home medications. # Hypokalemia, potassium repleted.  Resolved. 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  Baseline creatinine is 1.6. # CAD, PVD, -Off  Plavix, continue statin home dose. Palliative care consulted for goals of  care discussion, family decided for hospice care at home.  Stable to be discharged to home with hospice services today.  Body mass index is 19.64 kg/m.  Nutrition Interventions:  On the day of the discharge the patient's vitals were stable, and no other acute medical condition were reported by patient. the patient was felt safe to be discharge at Home with hospice services..  Consultants: Neurosurgery and palliative care Procedures: None  Discharge Exam: General: Appear in no distress, no Rash; Oral Mucosa Clear, moist. Cardiovascular: S1 and S2 Present, no Murmur, Respiratory: normal respiratory effort, Bilateral Air entry present and no Crackles, no wheezes Abdomen: Bowel Sound present, Soft and no tenderness, no hernia Extremities: no Pedal edema, no calf tenderness Neurology: alert and oriented to time, place, and person affect appropriate.  Filed Weights   01/11/23 0603 01/12/23 0421  Weight: 48.9 kg 50.3 kg   Vitals:   01/12/23 0740 01/12/23 1152  BP: (!) 145/64 (!) 118/51  Pulse: (!) 55 (!) 55  Resp: 16 16  Temp: 97.7 F (36.5 C) 97.6 F (36.4 C)  SpO2: 99% 100%    DISCHARGE MEDICATION: Allergies as of 01/12/2023   No Known Allergies      Medication List     STOP taking these medications    aspirin 81 MG tablet   Gemtesa 75 MG Tabs Generic drug: Vibegron   multivitamin with minerals Tabs tablet   nitroGLYCERIN 0.4 MG SL tablet Commonly known as: NITROSTAT   omeprazole 20 MG capsule Commonly known as: PRILOSEC       TAKE these medications    apixaban 2.5 MG Tabs tablet Commonly known as: ELIQUIS Take 1 tablet (2.5 mg total) by mouth 2 (two) times daily.   atorvastatin 80 MG tablet Commonly known as: LIPITOR Take 1 tablet (80 mg total) by mouth daily.   furosemide 40 MG tablet Commonly known as: LASIX Take 1 tablet (40 mg total) by mouth daily.   metoprolol succinate 25 MG 24 hr tablet Commonly known as: TOPROL-XL Take 0.5 tablets  (12.5 mg total) by mouth daily.   Nicoderm CQ 14 MG/24HR patch Generic drug: nicotine Place 1 patch onto the skin daily.   oxyCODONE 5 MG immediate release tablet Commonly known as: Oxy IR/ROXICODONE Take 1 tablet (5 mg total) by mouth every 8 (eight) hours as needed for moderate pain.       No Known Allergies Discharge Instructions     Call MD for:  difficulty breathing, headache or visual disturbances   Complete by: As directed    Call MD for:  severe uncontrolled pain   Complete by: As directed    Diet - low sodium heart healthy   Complete by: As directed    Discharge instructions   Complete by: As directed    Follow-up with the hospice care for further management at home.   Increase activity slowly   Complete by: As directed        The results of significant diagnostics from this hospitalization (including imaging, microbiology, ancillary and laboratory) are listed below for reference.    Significant Diagnostic Studies: DG Chest Port 1 View  Result Date: 01/11/2023 CLINICAL DATA:  Status post left thoracentesis. EXAM: PORTABLE CHEST 1 VIEW COMPARISON:  01/10/2023 FINDINGS: Stable cardiac enlargement. Following left thoracentesis, no significant residual left pleural fluid is visualized. No pneumothorax. Mild elevation of the right hemidiaphragm with right basilar atelectasis. Potential small right pleural effusion.  IMPRESSION: 1. No significant residual left pleural fluid following thoracentesis. No pneumothorax. 2. Right basilar atelectasis and potential small right pleural effusion. Electronically Signed   By: Irish Lack M.D.   On: 01/11/2023 16:32   US THORACENTESIS ASP PLEURAL SPACE W/IMG GUIDE  Result Date: 01/11/2023 INDICATION: Patient with shortness of breath and left-sided pleural effusion request received for diagnostic and therapeutic thoracentesis. EXAM: ULTRASOUND GUIDED LEFT THORACENTESIS MEDICATIONS: Local 1% lidocaine only. COMPLICATIONS: None  immediate. PROCEDURE: An ultrasound guided thoracentesis was thoroughly discussed with the patient and questions answered. The benefits, risks, alternatives and complications were also discussed. The patient understands and wishes to proceed with the procedure. Written consent was obtained. Ultrasound was performed to localize and mark an adequate pocket of fluid in the left chest. The area was then prepped and draped in the normal sterile fashion. 1% Lidocaine was used for local anesthesia. Under ultrasound guidance a 19 gauge, 7-cm, Yueh catheter was introduced. Thoracentesis was performed. The catheter was removed and a dressing applied. FINDINGS: A total of approximately 250 mL of clear yellow fluid was removed. Samples were sent to the laboratory as requested by the clinical team. IMPRESSION: Successful ultrasound guided left thoracentesis yielding 250 mL of pleural fluid. This exam was performed by Pattricia Boss PA-C, and was supervised and interpreted by Dr. Fredia Sorrow. Electronically Signed   By: Irish Lack M.D.   On: 01/11/2023 16:22   DG Chest Port 1 View  Result Date: 01/10/2023 CLINICAL DATA:  Provided history: Status post thoracentesis. EXAM: PORTABLE CHEST 1 VIEW COMPARISON:  Prior chest radiographs 01/09/2023 and earlier. FINDINGS: Cardiomegaly. Aortic atherosclerosis. Significant interval decrease in size of a right pleural effusion status post thoracentesis. Only a trace residual right pleural effusion remains. No evidence of pneumothorax. Mild ill-defined opacities within the right lung base, favored to reflect atelectasis. Opacity within the medial left lung base has decreased from the prior examination of 01/09/2023. Background chronic prominence of the interstitial lung markings. No acute osseous abnormality identified. Spondylosis and dextrocurvature of the thoracic and visualized upper thoracic spine. Surgical clips within the lower right neck. IMPRESSION: 1. No evidence of pneumothorax  status post right thoracentesis. Only a trace residual right pleural effusion remains. 2. Mild ill-defined opacities within the right lung base, favored to reflect atelectasis. 3. Opacity within the medial left lung base, decreased from the prior examination of 01/09/2023. This may reflect atelectasis and/or airspace consolidation. 4. Cardiomegaly 5. Aortic Atherosclerosis (ICD10-I70.0). Electronically Signed   By: Jackey Loge D.O.   On: 01/10/2023 14:30   US THORACENTESIS ASP PLEURAL SPACE W/IMG GUIDE  Result Date: 01/10/2023 INDICATION: 77 year old female referred for right-sided thoracentesis EXAM: ULTRASOUND GUIDED RIGHT THORACENTESIS MEDICATIONS: None. COMPLICATIONS: None PROCEDURE: An ultrasound guided thoracentesis was thoroughly discussed with the patient and questions answered. The benefits, risks, alternatives and complications were also discussed. The patient understands and wishes to proceed with the procedure. Written consent was obtained. Ultrasound was performed to localize and mark an adequate pocket of fluid in the right chest. The area was then prepped and draped in the normal sterile fashion. 1% Lidocaine was used for local anesthesia. Under ultrasound guidance a 19 gauge, 7-cm, Yueh catheter was introduced. Thoracentesis was performed. The catheter was removed and a dressing applied. FINDINGS: A total of approximately 1 L of thin yellow fluid was removed. Samples were sent to the laboratory as requested by the clinical team. IMPRESSION: Status post ultrasound-guided right-sided thoracentesis Signed, Yvone Neu. Miachel Roux, RPVI [facility]  [facility] Electronically Signed   By: Gilmer Mor D.O.   On: 01/10/2023 13:26   CT HEAD WO CONTRAST ( )  Result Date: 01/09/2023 CLINICAL DATA:  Head trauma, moderate-severe.  Recent fall. EXAM: CT HEAD WITHOUT CONTRAST TECHNIQUE: Contiguous axial images were obtained from the base of the skull  through the vertex without intravenous contrast. RADIATION DOSE REDUCTION: This exam was performed according to the departmental dose-optimization program which includes automated exposure control, adjustment of the mA and/or kV according to patient size and/or use of iterative reconstruction technique. COMPARISON:  CT head 06/29/2017 FINDINGS: Brain: There is no evidence of an acute infarct, intracranial hemorrhage, mass, midline shift, or extra-axial fluid collection. 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 Hypodensities elsewhere in the cerebral white matter bilaterally are nonspecific but compatible with mild chronic small vessel ischemic disease. Mild global cerebral atrophy is within normal limits for age. Vascular: Calcified atherosclerosis at the skull base. No hyperdense vessel. Skull: No acute fracture or suspicious osseous lesion. Sinuses/Orbits: Small volume fluid in the left sphenoid sinus. Small chronic bilateral mastoid effusions. Bilateral cataract extraction. Other: None. IMPRESSION: 1. No evidence of acute intracranial abnormality. 2. Chronic right PCA infarct, new from 2019. Electronically Signed   By: Sebastian Ache M.D.   On: 01/09/2023 17:07   CT Lumbar Spine Wo Contrast  Result Date: 01/09/2023 CLINICAL DATA:  Back trauma, no prior imaging (Age >= 16y). Hip pain. Recent fall with inability to ambulate. EXAM: CT LUMBAR SPINE WITHOUT CONTRAST TECHNIQUE: Multidetector CT imaging of the lumbar spine was performed without intravenous contrast administration. Multiplanar CT image reconstructions were also generated. RADIATION DOSE REDUCTION: This exam was performed according to the departmental dose-optimization program which includes automated exposure control, adjustment of the mA and/or kV according to patient size and/or use of iterative reconstruction technique. COMPARISON:  CT lumbar spine 11/04/2022 FINDINGS: Segmentation: 5 lumbar type vertebrae. Alignment: Unchanged  and near anatomic. Vertebrae: T11 superior endplate compression fracture with 30% vertebral body height loss and a visible, of incompletely healed fracture line with surrounding sclerosis suggestive of a subacute time course. Chronic L3 compression fracture with unchanged height loss and 6 mm retropulsion. Diffuse osteopenia. No suspicious bone lesion. Paraspinal and other soft tissues: Partially visualized pleural effusions, right larger than left. Extensive atherosclerosis of the abdominal aorta and its branch vessels. Disc levels: Unchanged moderate spinal stenosis at L2-3 due to retropulsion of the L3 superior endplate and posterior element hypertrophy. Up to moderate neural foraminal stenosis from L2-3 through L5-S1, also unchanged. IMPRESSION: 1. Subacute appearing T11 compression fracture with 30% height loss. 2. Unchanged chronic L3 compression fracture with retropulsion and moderate spinal stenosis. 3. Partially visualized right larger than left pleural effusions. 4.  Aortic Atherosclerosis (ICD10-I70.0). Electronically Signed   By: Sebastian Ache M.D.   On: 01/09/2023 17:03   CT PELVIS WO CONTRAST  Result Date: 01/09/2023 CLINICAL DATA:  Hip pain after fall for 5 days. EXAM: CT PELVIS WITHOUT CONTRAST TECHNIQUE: Multidetector CT imaging of the pelvis was performed following the standard protocol without intravenous contrast. RADIATION DOSE REDUCTION: This exam was performed according to the departmental dose-optimization program which includes automated exposure control, adjustment of the mA and/or kV according to patient size and/or use of iterative reconstruction technique. COMPARISON:  March 14, 2017. FINDINGS: Urinary Tract:  Urinary bladder is unremarkable. Bowel: Sigmoid diverticulosis is noted with mild wall thickening and possible mild surrounding inflammatory changes. There is no evidence of bowel obstruction. Vascular/Lymphatic: Aortic atherosclerosis.  No adenopathy is noted. Reproductive:   Status post hysterectomy.  No adnexal abnormality. Other:  No ascites or hernia. Musculoskeletal: No definite fracture is noted. IMPRESSION: Sigmoid diverticulosis is noted. There is noted mild wall thickening of sigmoid colon with possible surrounding inflammatory changes suggesting the possibility of diverticulitis or colitis. Aortic Atherosclerosis (ICD10-I70.0). Electronically Signed   By: Lupita Raider M.D.   On: 01/09/2023 16:55   DG Chest Portable 1 View  Result Date: 01/09/2023 CLINICAL DATA:  Shortness of breath and hip pain after fall EXAM: PORTABLE CHEST 1 VIEW COMPARISON:  Chest radiograph dated 12/31/2022 FINDINGS: Partially imaged right neck surgical clips. Low lung volumes. Increased asymmetric right lung opacification. Dense left retrocardiac opacities. Mild bilateral interstitial opacities. Large right pleural effusion. Trace left pleural effusion. No pneumothorax. Right heart border is obscured. No radiographic finding of acute displaced fracture. IMPRESSION: 1. Large right pleural effusion with increased asymmetric right lung opacification and dense left retrocardiac opacities, which may represent atelectasis or infection. 2. Mild bilateral interstitial opacities, likely pulmonary edema. Electronically Signed   By: Agustin Cree M.D.   On: 01/09/2023 16:54   DG Chest 1 View  Result Date: 12/31/2022 CLINICAL DATA:  Congestive heart failure. EXAM: CHEST  1 VIEW COMPARISON:  12/30/2022 FINDINGS: The cardio pericardial silhouette is enlarged. Bibasilar collapse/consolidation noted, right greater than left with moderate right pleural effusion. Diffuse interstitial opacity likely chronic although superimposed component of interstitial edema not excluded. Bones are diffusely demineralized. Telemetry leads overlie the chest. IMPRESSION: 1. No substantial change. 2. Bibasilar collapse/consolidation, right greater than left with moderate right pleural effusion. 3. Diffuse interstitial opacity likely  chronic although superimposed component of interstitial edema not excluded. Electronically Signed   By: Kennith Center M.D.   On: 12/31/2022 07:30   DG Chest 2 View  Result Date: 12/30/2022 CLINICAL DATA:  Cough, dyspnea. EXAM: CHEST - 2 VIEW COMPARISON:  Chest radiograph dated 12/01/2022. FINDINGS: The heart size is borderline enlarged. Vascular calcifications are seen in the aortic arch. There is a moderate right pleural effusion with associated atelectasis/airspace disease. There is no left pleural effusion. There is mild left basilar atelectasis/airspace disease. Mild diffuse bilateral interstitial opacities are seen. No pneumothorax. IMPRESSION: Moderate right pleural effusion with associated atelectasis/airspace disease. Mild left basilar atelectasis/airspace disease. Mild diffuse bilateral interstitial opacities may represent pulmonary edema. Electronically Signed   By: Romona Curls M.D.   On: 12/30/2022 09:26    Microbiology: Recent Results (from the past 240 hour(s))  Body fluid culture w Gram Stain     Status: None (Preliminary result)   Collection Time: 01/10/23 11:50 AM   Specimen: PATH Cytology Pleural fluid  Result Value Ref Range Status   Specimen Description   Final    PLEURAL Performed at [facility], [address] [place], [place] [address]    Special Requests   Final    PLEURAL Performed at [facility], [address] [place], [place] [address]    Gram Stain   Final    WBC PRESENT, PREDOMINANTLY MONONUCLEAR NO ORGANISMS SEEN CYTOSPIN SMEAR    Culture   Final    NO GROWTH 2 DAYS Performed at [facility], [address], [place], [place] [address]    Report Status PENDING  Incomplete  Body fluid culture w Gram Stain     Status: None (Preliminary result)   Collection Time: 01/11/23  4:19 PM   Specimen: PATH Cytology Pleural fluid  Result Value Ref Range Status  Specimen Description   Final    FLUID Performed at [facility], [address] [place], [place] [address]    Special Requests   Final    PLEURAL FLUID Performed at [facility] [place], [address] [place], [place] [address]    Gram Stain   Final    RARE WBC PRESENT, PREDOMINANTLY MONONUCLEAR NO ORGANISMS SEEN    Culture   Final    NO GROWTH < 12 HOURS Performed at [facility], [address] [place], [place] [address]    Report Status PENDING  Incomplete     Labs: CBC: Recent Labs  Lab 01/09/23 1439 01/11/23 0436 01/12/23 0336  WBC 7.5 6.9 8.0  NEUTROABS 4.8  --   --   HGB 13.1 11.4* 11.8*  HCT 41.4 35.1* 37.4  MCV 92.0 90.0 93.5  PLT 172 150 158   Basic Metabolic Panel: Recent Labs  Lab 01/09/23 1439 01/10/23 0446 01/11/23 0436 01/12/23 0336  NA 142 144 137 138  K 3.1* 3.1* 3.6 4.0  CL 108 108 105 104  CO2 23 26 24 26   GLUCOSE 94 97 100* 94  BUN 28* 27* 32* 37*  CREATININE 1.45* 1.61* 1.77* 1.68*  CALCIUM 8.1* 8.0* 7.5* 7.7*  MG  --  2.2 1.8 1.8  PHOS  --  3.7 3.0 2.6   Liver Function Tests: Recent Labs  Lab 01/10/23 1724  AST 26  ALT 15  ALKPHOS 114  BILITOT 0.5  PROT 5.6*  ALBUMIN 2.4*   No results for input(s): "LIPASE", "AMYLASE" in the last 168 hours. No results for input(s): "AMMONIA" in the last 168 hours. Cardiac Enzymes: No results for input(s): "CKTOTAL", "CKMB", "CKMBINDEX", "TROPONINI" in the last 168 hours. BNP (last 3 results) Recent Labs    12/01/22 0050 12/30/22 0857 01/09/23 1439  BNP >4,500.0* >4,500.0* >4,500.0*   CBG: No results for input(s): "GLUCAP" in the last 168 hours.  Time spent: 35 minutes  Signed:  Gillis Santa  [facility] 01/12/2023 12:00 PM

## 2023-01-12 NOTE — Plan of Care (Signed)
Problem: Education: Goal: Knowledge of General Education information will improve Description: Including pain rating scale, medication(s)/side effects and non-pharmacologic comfort measures 01/12/2023 1401 by Sherre Scarlet, RN Outcome: Adequate for Discharge 01/12/2023 1401 by Sherre Scarlet, RN Outcome: Adequate for Discharge 01/12/2023 1401 by Sherre Scarlet, RN Outcome: Progressing   Problem: Health Behavior/Discharge Planning: Goal: Ability to manage health-related needs will improve 01/12/2023 1401 by Sherre Scarlet, RN Outcome: Adequate for Discharge 01/12/2023 1401 by Sherre Scarlet, RN Outcome: Adequate for Discharge 01/12/2023 1401 by Sherre Scarlet, RN Outcome: Progressing   Problem: Clinical Measurements: Goal: Ability to maintain clinical measurements within normal limits will improve 01/12/2023 1401 by Sherre Scarlet, RN Outcome: Adequate for Discharge 01/12/2023 1401 by Sherre Scarlet, RN Outcome: Adequate for Discharge 01/12/2023 1401 by Sherre Scarlet, RN Outcome: Progressing Goal: Will remain free from infection 01/12/2023 1401 by Sherre Scarlet, RN Outcome: Adequate for Discharge 01/12/2023 1401 by Sherre Scarlet, RN Outcome: Adequate for Discharge 01/12/2023 1401 by Sherre Scarlet, RN Outcome: Progressing Goal: Diagnostic test results will improve 01/12/2023 1401 by Sherre Scarlet, RN Outcome: Adequate for Discharge 01/12/2023 1401 by Sherre Scarlet, RN Outcome: Adequate for Discharge 01/12/2023 1401 by Sherre Scarlet, RN Outcome: Progressing Goal: Respiratory complications will improve 01/12/2023 1401 by Sherre Scarlet, RN Outcome: Adequate for Discharge 01/12/2023 1401 by Sherre Scarlet, RN Outcome: Adequate for Discharge 01/12/2023 1401 by Sherre Scarlet, RN Outcome: Progressing Goal: Cardiovascular complication will be avoided 01/12/2023 1401 by Sherre Scarlet, RN Outcome: Adequate for Discharge 01/12/2023 1401 by Sherre Scarlet,  RN Outcome: Adequate for Discharge 01/12/2023 1401 by Sherre Scarlet, RN Outcome: Progressing   Problem: Activity: Goal: Risk for activity intolerance will decrease 01/12/2023 1401 by Sherre Scarlet, RN Outcome: Adequate for Discharge 01/12/2023 1401 by Sherre Scarlet, RN Outcome: Adequate for Discharge 01/12/2023 1401 by Sherre Scarlet, RN Outcome: Progressing   Problem: Nutrition: Goal: Adequate nutrition will be maintained 01/12/2023 1401 by Sherre Scarlet, RN Outcome: Adequate for Discharge 01/12/2023 1401 by Sherre Scarlet, RN Outcome: Adequate for Discharge 01/12/2023 1401 by Sherre Scarlet, RN Outcome: Progressing   Problem: Coping: Goal: Level of anxiety will decrease 01/12/2023 1401 by Sherre Scarlet, RN Outcome: Adequate for Discharge 01/12/2023 1401 by Sherre Scarlet, RN Outcome: Adequate for Discharge 01/12/2023 1401 by Sherre Scarlet, RN Outcome: Progressing   Problem: Elimination: Goal: Will not experience complications related to bowel motility 01/12/2023 1401 by Sherre Scarlet, RN Outcome: Adequate for Discharge 01/12/2023 1401 by Sherre Scarlet, RN Outcome: Adequate for Discharge 01/12/2023 1401 by Sherre Scarlet, RN Outcome: Progressing Goal: Will not experience complications related to urinary retention 01/12/2023 1401 by Sherre Scarlet, RN Outcome: Adequate for Discharge 01/12/2023 1401 by Sherre Scarlet, RN Outcome: Adequate for Discharge 01/12/2023 1401 by Sherre Scarlet, RN Outcome: Progressing   Problem: Pain Managment: Goal: General experience of comfort will improve 01/12/2023 1401 by Sherre Scarlet, RN Outcome: Adequate for Discharge 01/12/2023 1401 by Sherre Scarlet, RN Outcome: Adequate for Discharge 01/12/2023 1401 by Sherre Scarlet, RN Outcome: Progressing   Problem: Safety: Goal: Ability to remain free from injury will improve 01/12/2023 1401 by Sherre Scarlet, RN Outcome: Adequate for Discharge 01/12/2023 1401 by Sherre Scarlet, RN Outcome: Adequate for Discharge 01/12/2023 1401 by Sherre Scarlet, RN Outcome: Progressing   Problem: Skin Integrity: Goal: Risk for impaired skin integrity will decrease 01/12/2023 1401 by Sherre Scarlet, RN Outcome: Adequate for Discharge 01/12/2023 1401 by Sherre Scarlet, RN Outcome: Adequate for Discharge 01/12/2023 1401 by Sherre Scarlet, RN Outcome: Progressing   Problem: Acute Rehab PT Goals(only PT should resolve) Goal: Pt Will  Go Sit To Supine/Side Outcome: Adequate for Discharge Goal: Patient Will Transfer Sit To/From Stand Outcome: Adequate for Discharge Goal: Pt Will Transfer Bed To Chair/Chair To Bed Outcome: Adequate for Discharge Goal: Pt Will Ambulate Outcome: Adequate for Discharge   Problem: Acute Rehab OT Goals (only OT should resolve) Goal: Pt. Will Perform Grooming Outcome: Adequate for Discharge Goal: Pt. Will Perform Lower Body Dressing Outcome: Adequate for Discharge Goal: Pt. Will Transfer To Toilet Outcome: Adequate for Discharge Goal: Pt. Will Perform Toileting-Clothing Manipulation Outcome: Adequate for Discharge

## 2023-01-12 NOTE — Care Management Important Message (Signed)
Important Message  Patient Details  Name: Ashley Lynch MRN: [id number] Date of Birth: 01/20/1946   Important Message Given:  Other (see comment)  Disposition to discharge with hospice services.  Medicare IM withheld at this time out of respect for patient and family.   [name] 01/12/2023, 12:50 PM

## 2023-01-14 LAB — BODY FLUID CULTURE W GRAM STAIN: Culture: NO GROWTH

## 2023-01-15 ENCOUNTER — Ambulatory Visit: Payer: Self-pay

## 2023-01-15 ENCOUNTER — Telehealth: Payer: Self-pay

## 2023-01-15 LAB — BODY FLUID CULTURE W GRAM STAIN: Culture: NO GROWTH

## 2023-01-15 NOTE — Patient Outreach (Signed)
Care Coordination   01/15/2023 Name: Ashley Lynch MRN: [id number] DOB: February 22, 1946   Care Coordination Outreach Attempts:  Successful contact made with granddaughter.  She request RNCM to contact patients daughter, Alda Lea for questions regarding patients care.  Telephone call to [facility].  Contact made with Roxanne who states patient is receiving hospice services with [facility].    Follow Up Plan:  No further outreach attempts will be made at this time. We have been unable to contact the patient to offer or enroll patient in care coordination services  Encounter Outcome:  Patient Visit Completed   Care Coordination Interventions:  No, not indicated    George Ina [facility] Care Coordination [phone] direct line

## 2023-01-15 NOTE — Patient Outreach (Signed)
Care Coordination   Initial Visit Note   01/15/2023 Name: Ashley Lynch MRN: [id number] DOB: [birth year]  Ashley Lynch is a 77 y.o. year old female who sees Margaretann Loveless, MD for primary care. I  spoke with Barrie Folk, daughter and HPOA.  Attempted call to patient and grandaughter Ashley Lynch.  Unable to reach. HIPAA compliant voice messages left with return call phone number.   What matters to the patients health and wellness today?  Daughter Diep Copland states she lives in [place] and is not managing patients day to day care needs.   Daughter request and gives verbal authorization to [facility] to speak with patients granddaughter, Ashley Lynch.  Ms. Folden states there has been discussion for patient to have either palliative or hospice care but she is unsure which service patient is receiving at this time.       Goals Addressed   None     SDOH assessments and interventions completed:  No     Care Coordination Interventions:  No, not indicated   Follow up plan:  will attempt return call to patient and/ or granddaughter, Ashley Lynch.     Encounter Outcome:  Patient Visit Completed   George Ina [facility] Care Coordination [phone] direct line

## 2023-01-26 NOTE — Telephone Encounter (Signed)
Patient is currently in palliative care with Hospice. Will she still need this appt?

## 2023-01-26 NOTE — Telephone Encounter (Signed)
Yes - thank you

## 2023-02-16 DEATH — deceased
